# Patient Record
Sex: Male | Born: 1935 | Race: White | Hispanic: No | Marital: Married | Smoking: Former smoker
Health system: Southern US, Community
[De-identification: ages and names within clinical notes are randomized; demographics above are authoritative.]

## PROBLEM LIST (undated history)

## (undated) DIAGNOSIS — I509 Heart failure, unspecified: Secondary | ICD-10-CM

## (undated) DIAGNOSIS — I10 Essential (primary) hypertension: Secondary | ICD-10-CM

## (undated) DIAGNOSIS — J449 Chronic obstructive pulmonary disease, unspecified: Secondary | ICD-10-CM

## (undated) DIAGNOSIS — I4891 Unspecified atrial fibrillation: Secondary | ICD-10-CM

## (undated) DIAGNOSIS — I340 Nonrheumatic mitral (valve) insufficiency: Secondary | ICD-10-CM

## (undated) DIAGNOSIS — N200 Calculus of kidney: Secondary | ICD-10-CM

## (undated) DIAGNOSIS — N390 Urinary tract infection, site not specified: Secondary | ICD-10-CM

## (undated) DIAGNOSIS — I639 Cerebral infarction, unspecified: Secondary | ICD-10-CM

## (undated) DIAGNOSIS — K225 Diverticulum of esophagus, acquired: Secondary | ICD-10-CM

## (undated) DIAGNOSIS — F0390 Unspecified dementia without behavioral disturbance: Secondary | ICD-10-CM

## (undated) HISTORY — PX: LAPAROSCOPIC CHOLECYSTECTOMY: SUR755

## (undated) HISTORY — PX: KIDNEY STONE SURGERY: SHX686

## (undated) HISTORY — PX: ZENKER'S DIVERTICULECTOMY: SHX6190

## (undated) HISTORY — DX: Chronic obstructive pulmonary disease, unspecified: J44.9

---

## 1997-05-14 ENCOUNTER — Inpatient Hospital Stay (HOSPITAL_COMMUNITY): Admission: AD | Admit: 1997-05-14 | Discharge: 1997-05-15 | Payer: Self-pay | Admitting: Cardiology

## 1999-03-03 ENCOUNTER — Ambulatory Visit (HOSPITAL_COMMUNITY): Admission: RE | Admit: 1999-03-03 | Discharge: 1999-03-03 | Payer: Self-pay | Admitting: Family Medicine

## 1999-03-03 ENCOUNTER — Encounter: Payer: Self-pay | Admitting: Family Medicine

## 2000-01-21 ENCOUNTER — Inpatient Hospital Stay (HOSPITAL_COMMUNITY): Admission: EM | Admit: 2000-01-21 | Discharge: 2000-01-28 | Payer: Self-pay | Admitting: Emergency Medicine

## 2000-01-21 ENCOUNTER — Encounter (INDEPENDENT_AMBULATORY_CARE_PROVIDER_SITE_OTHER): Payer: Self-pay | Admitting: *Deleted

## 2000-01-21 ENCOUNTER — Encounter: Payer: Self-pay | Admitting: Emergency Medicine

## 2000-01-23 ENCOUNTER — Encounter (INDEPENDENT_AMBULATORY_CARE_PROVIDER_SITE_OTHER): Payer: Self-pay | Admitting: *Deleted

## 2000-01-25 ENCOUNTER — Encounter (INDEPENDENT_AMBULATORY_CARE_PROVIDER_SITE_OTHER): Payer: Self-pay | Admitting: *Deleted

## 2000-01-26 ENCOUNTER — Encounter: Payer: Self-pay | Admitting: Internal Medicine

## 2002-06-07 ENCOUNTER — Encounter: Payer: Self-pay | Admitting: Internal Medicine

## 2002-06-07 ENCOUNTER — Encounter (INDEPENDENT_AMBULATORY_CARE_PROVIDER_SITE_OTHER): Payer: Self-pay | Admitting: *Deleted

## 2002-06-07 ENCOUNTER — Ambulatory Visit (HOSPITAL_COMMUNITY): Admission: RE | Admit: 2002-06-07 | Discharge: 2002-06-07 | Payer: Self-pay | Admitting: Internal Medicine

## 2002-06-28 ENCOUNTER — Ambulatory Visit (HOSPITAL_COMMUNITY): Admission: RE | Admit: 2002-06-28 | Discharge: 2002-06-28 | Payer: Self-pay | Admitting: Internal Medicine

## 2002-06-28 ENCOUNTER — Encounter: Payer: Self-pay | Admitting: Internal Medicine

## 2003-12-13 ENCOUNTER — Ambulatory Visit: Payer: Self-pay | Admitting: Internal Medicine

## 2003-12-13 ENCOUNTER — Inpatient Hospital Stay (HOSPITAL_COMMUNITY): Admission: EM | Admit: 2003-12-13 | Discharge: 2003-12-18 | Payer: Self-pay | Admitting: Emergency Medicine

## 2003-12-14 ENCOUNTER — Encounter: Payer: Self-pay | Admitting: Cardiology

## 2003-12-20 ENCOUNTER — Ambulatory Visit: Payer: Self-pay | Admitting: Cardiology

## 2003-12-26 ENCOUNTER — Ambulatory Visit (HOSPITAL_COMMUNITY): Admission: RE | Admit: 2003-12-26 | Discharge: 2003-12-26 | Payer: Self-pay | Admitting: Cardiology

## 2003-12-26 ENCOUNTER — Ambulatory Visit: Payer: Self-pay | Admitting: Cardiology

## 2004-02-13 ENCOUNTER — Ambulatory Visit: Payer: Self-pay | Admitting: Cardiology

## 2004-02-20 ENCOUNTER — Ambulatory Visit: Payer: Self-pay | Admitting: Cardiology

## 2004-02-25 ENCOUNTER — Ambulatory Visit: Payer: Self-pay | Admitting: Cardiology

## 2004-03-12 ENCOUNTER — Ambulatory Visit: Payer: Self-pay | Admitting: Cardiology

## 2004-05-08 ENCOUNTER — Ambulatory Visit: Payer: Self-pay | Admitting: Cardiology

## 2004-08-04 ENCOUNTER — Ambulatory Visit: Payer: Self-pay | Admitting: Cardiology

## 2004-08-05 ENCOUNTER — Ambulatory Visit: Payer: Self-pay | Admitting: Cardiology

## 2005-01-21 ENCOUNTER — Ambulatory Visit: Payer: Self-pay | Admitting: Cardiology

## 2006-02-09 ENCOUNTER — Ambulatory Visit: Payer: Self-pay

## 2006-02-09 ENCOUNTER — Ambulatory Visit: Payer: Self-pay | Admitting: Cardiology

## 2006-02-09 ENCOUNTER — Encounter: Payer: Self-pay | Admitting: Cardiology

## 2006-02-22 ENCOUNTER — Ambulatory Visit: Payer: Self-pay | Admitting: Internal Medicine

## 2006-02-22 LAB — CONVERTED CEMR LAB
Calcium: 9.1 mg/dL (ref 8.4–10.5)
Chloride: 107 meq/L (ref 96–112)
Creatinine, Ser: 1 mg/dL (ref 0.4–1.5)
Eosinophils Relative: 8.2 % — ABNORMAL HIGH (ref 0.0–5.0)
Glucose, Bld: 142 mg/dL — ABNORMAL HIGH (ref 70–99)
HCT: 35.1 % — ABNORMAL LOW (ref 39.0–52.0)
INR: 0.9 (ref 0.9–2.0)
Neutrophils Relative %: 57.3 % (ref 43.0–77.0)
Prothrombin Time: 12.1 s (ref 10.0–14.0)
RBC: 3.6 M/uL — ABNORMAL LOW (ref 4.22–5.81)
RDW: 12.6 % (ref 11.5–14.6)
Sodium: 143 meq/L (ref 135–145)
WBC: 4.7 10*3/uL (ref 4.5–10.5)
aPTT: 26.4 s — ABNORMAL LOW (ref 26.5–36.5)

## 2006-03-01 ENCOUNTER — Ambulatory Visit: Payer: Self-pay | Admitting: Internal Medicine

## 2006-03-01 ENCOUNTER — Ambulatory Visit (HOSPITAL_COMMUNITY): Admission: RE | Admit: 2006-03-01 | Discharge: 2006-03-01 | Payer: Self-pay | Admitting: Internal Medicine

## 2006-03-01 HISTORY — PX: OTHER SURGICAL HISTORY: SHX169

## 2006-03-10 ENCOUNTER — Ambulatory Visit: Payer: Self-pay

## 2006-03-17 ENCOUNTER — Emergency Department (HOSPITAL_COMMUNITY): Admission: EM | Admit: 2006-03-17 | Discharge: 2006-03-17 | Payer: Self-pay | Admitting: Emergency Medicine

## 2006-03-24 ENCOUNTER — Ambulatory Visit: Payer: Self-pay | Admitting: Cardiology

## 2006-03-24 LAB — CONVERTED CEMR LAB
Calcium: 9 mg/dL (ref 8.4–10.5)
Chloride: 107 meq/L (ref 96–112)
Creatinine, Ser: 1 mg/dL (ref 0.4–1.5)
Glucose, Bld: 133 mg/dL — ABNORMAL HIGH (ref 70–99)
Pro B Natriuretic peptide (BNP): 436 pg/mL — ABNORMAL HIGH (ref 0.0–100.0)
Sodium: 144 meq/L (ref 135–145)

## 2006-04-05 ENCOUNTER — Ambulatory Visit: Payer: Self-pay | Admitting: Cardiology

## 2006-05-10 ENCOUNTER — Ambulatory Visit: Payer: Self-pay | Admitting: Cardiology

## 2006-06-01 ENCOUNTER — Ambulatory Visit: Payer: Self-pay | Admitting: Cardiology

## 2006-06-01 ENCOUNTER — Ambulatory Visit: Payer: Self-pay | Admitting: Internal Medicine

## 2006-06-01 ENCOUNTER — Inpatient Hospital Stay (HOSPITAL_COMMUNITY): Admission: EM | Admit: 2006-06-01 | Discharge: 2006-06-03 | Payer: Self-pay | Admitting: Emergency Medicine

## 2006-06-03 ENCOUNTER — Ambulatory Visit: Payer: Self-pay | Admitting: Vascular Surgery

## 2006-06-03 ENCOUNTER — Encounter: Payer: Self-pay | Admitting: Internal Medicine

## 2006-07-12 ENCOUNTER — Ambulatory Visit: Payer: Self-pay | Admitting: Cardiology

## 2006-11-24 ENCOUNTER — Ambulatory Visit: Payer: Self-pay | Admitting: Cardiology

## 2006-12-01 ENCOUNTER — Ambulatory Visit: Payer: Self-pay

## 2007-05-17 ENCOUNTER — Ambulatory Visit: Payer: Self-pay | Admitting: Internal Medicine

## 2007-06-07 ENCOUNTER — Ambulatory Visit (HOSPITAL_COMMUNITY): Admission: RE | Admit: 2007-06-07 | Discharge: 2007-06-07 | Payer: Self-pay | Admitting: Internal Medicine

## 2007-06-07 ENCOUNTER — Encounter: Payer: Self-pay | Admitting: Internal Medicine

## 2007-06-15 ENCOUNTER — Ambulatory Visit: Payer: Self-pay | Admitting: Internal Medicine

## 2007-06-17 ENCOUNTER — Ambulatory Visit: Payer: Self-pay | Admitting: Cardiology

## 2007-06-17 ENCOUNTER — Inpatient Hospital Stay (HOSPITAL_COMMUNITY): Admission: EM | Admit: 2007-06-17 | Discharge: 2007-06-21 | Payer: Self-pay | Admitting: Emergency Medicine

## 2007-06-20 ENCOUNTER — Ambulatory Visit: Payer: Self-pay | Admitting: Surgery

## 2007-06-20 ENCOUNTER — Encounter (INDEPENDENT_AMBULATORY_CARE_PROVIDER_SITE_OTHER): Payer: Self-pay | Admitting: Internal Medicine

## 2007-07-29 ENCOUNTER — Encounter (INDEPENDENT_AMBULATORY_CARE_PROVIDER_SITE_OTHER): Payer: Self-pay | Admitting: *Deleted

## 2007-12-01 ENCOUNTER — Ambulatory Visit: Payer: Self-pay | Admitting: Cardiology

## 2008-01-31 ENCOUNTER — Ambulatory Visit: Payer: Self-pay | Admitting: Cardiology

## 2008-03-21 ENCOUNTER — Encounter: Admission: RE | Admit: 2008-03-21 | Discharge: 2008-03-21 | Payer: Self-pay | Admitting: Family Medicine

## 2008-09-29 DIAGNOSIS — K219 Gastro-esophageal reflux disease without esophagitis: Secondary | ICD-10-CM | POA: Insufficient documentation

## 2008-09-29 DIAGNOSIS — I10 Essential (primary) hypertension: Secondary | ICD-10-CM | POA: Insufficient documentation

## 2008-09-29 DIAGNOSIS — R55 Syncope and collapse: Secondary | ICD-10-CM | POA: Insufficient documentation

## 2008-11-13 ENCOUNTER — Encounter: Payer: Self-pay | Admitting: Cardiology

## 2008-11-14 ENCOUNTER — Ambulatory Visit: Payer: Self-pay

## 2008-11-14 ENCOUNTER — Encounter: Payer: Self-pay | Admitting: Cardiology

## 2008-12-24 ENCOUNTER — Ambulatory Visit: Payer: Self-pay | Admitting: Internal Medicine

## 2008-12-24 DIAGNOSIS — I679 Cerebrovascular disease, unspecified: Secondary | ICD-10-CM

## 2008-12-24 DIAGNOSIS — K222 Esophageal obstruction: Secondary | ICD-10-CM

## 2009-01-21 ENCOUNTER — Telehealth: Payer: Self-pay | Admitting: Internal Medicine

## 2009-01-24 ENCOUNTER — Ambulatory Visit: Payer: Self-pay | Admitting: Internal Medicine

## 2009-01-24 ENCOUNTER — Ambulatory Visit (HOSPITAL_COMMUNITY): Admission: RE | Admit: 2009-01-24 | Discharge: 2009-01-24 | Payer: Self-pay | Admitting: Internal Medicine

## 2009-01-24 HISTORY — PX: ESOPHAGOGASTRODUODENOSCOPY: SHX1529

## 2009-01-28 ENCOUNTER — Ambulatory Visit: Payer: Self-pay | Admitting: Cardiology

## 2009-01-30 ENCOUNTER — Encounter: Payer: Self-pay | Admitting: Internal Medicine

## 2009-01-30 LAB — CONVERTED CEMR LAB
ALT: 12 units/L (ref 0–53)
AST: 17 units/L (ref 0–37)
Albumin: 3.9 g/dL (ref 3.5–5.2)
HDL: 44.8 mg/dL (ref >39.00)
Total Bilirubin: 0.7 mg/dL (ref 0.3–1.2)
Triglycerides: 70 mg/dL (ref 0.0–149.0)
VLDL: 14 mg/dL (ref 0.0–40.0)

## 2009-01-31 ENCOUNTER — Ambulatory Visit: Payer: Self-pay | Admitting: Internal Medicine

## 2009-02-21 ENCOUNTER — Emergency Department (HOSPITAL_COMMUNITY): Admission: EM | Admit: 2009-02-21 | Discharge: 2009-02-21 | Payer: Self-pay | Admitting: Emergency Medicine

## 2009-03-12 ENCOUNTER — Encounter: Admission: RE | Admit: 2009-03-12 | Discharge: 2009-04-18 | Payer: Self-pay | Admitting: Obstetrics and Gynecology

## 2009-03-15 ENCOUNTER — Telehealth (INDEPENDENT_AMBULATORY_CARE_PROVIDER_SITE_OTHER): Payer: Self-pay | Admitting: *Deleted

## 2009-07-03 ENCOUNTER — Encounter: Payer: Self-pay | Admitting: Internal Medicine

## 2009-07-16 ENCOUNTER — Encounter: Payer: Self-pay | Admitting: Cardiology

## 2009-07-18 ENCOUNTER — Telehealth: Payer: Self-pay | Admitting: Cardiology

## 2009-07-30 ENCOUNTER — Ambulatory Visit: Payer: Self-pay | Admitting: Internal Medicine

## 2009-08-01 ENCOUNTER — Ambulatory Visit (HOSPITAL_COMMUNITY): Admission: RE | Admit: 2009-08-01 | Discharge: 2009-08-01 | Payer: Self-pay | Admitting: Internal Medicine

## 2009-08-14 ENCOUNTER — Encounter: Payer: Self-pay | Admitting: Internal Medicine

## 2009-08-16 ENCOUNTER — Ambulatory Visit: Payer: Self-pay | Admitting: Cardiology

## 2009-09-15 ENCOUNTER — Ambulatory Visit: Payer: Self-pay | Admitting: Internal Medicine

## 2009-09-15 ENCOUNTER — Inpatient Hospital Stay (HOSPITAL_COMMUNITY): Admission: EM | Admit: 2009-09-15 | Discharge: 2009-09-17 | Payer: Self-pay | Admitting: Physical Therapy

## 2009-09-17 ENCOUNTER — Encounter (INDEPENDENT_AMBULATORY_CARE_PROVIDER_SITE_OTHER): Payer: Self-pay | Admitting: Neurology

## 2009-11-22 ENCOUNTER — Ambulatory Visit: Payer: Self-pay

## 2009-11-22 ENCOUNTER — Encounter: Payer: Self-pay | Admitting: Cardiology

## 2010-02-11 NOTE — Assessment & Plan Note (Signed)
Summary: DISUSS REMOVAL OF LOOP RECORDER    Visit Type:  Follow-up Primary Provider:  Donovan Kail, MD   History of Present Illness: Nathan Mills returns today for followup.  He is a pleasant elderly man with a h/o CAD, unexplained syncope and HTN.  He underwent insertion of an ILR several yrs ago and had no recurrent syncope.  Its battery is now depleted and he is referred to consider possible device removal.  He is not bothered by the device and he has been stable.  Current Medications (verified): 1)  Prilosec 20 Mg  Cpdr (Omeprazole) .... Once Daily 2)  Toprol Xl 50 Mg  Tb24 (Metoprolol Succinate) .... 1/2 By Mouth Daily 3)  Trazodone Hcl 50 Mg  Tabs (Trazodone Hcl) .... 2 Tabs Once Daily 4)  Alprazolam 0.5 Mg  Tabs (Alprazolam) .... Once Daily 5)  Plavix 75 Mg  Tabs (Clopidogrel Bisulfate) .... Take One Tablet By Mouth Once Daily. 6)  Lexapro 10 Mg  Tabs (Escitalopram Oxalate) .... Once Daily 7)  Proscar 5 Mg  Tabs (Finasteride) .... Qd 8)  Caduet 5-20 Mg  Tabs (Amlodipine-Atorvastatin) .... Once Daily  Allergies: 1)  ! * Lithium  Past History:  Past Medical History: Last updated: 09/29/2008 CAD (ICD-414.00) SYNCOPE (ICD-780.2) TIA (ICD-435.9) ZENKER'S DIVERTICULUM (ICD-530.6) HIATAL HERNIA (ICD-553.3) GERD (ICD-530.81) DEPRESSION (ICD-311) NEPHROLITHIASIS (ICD-592.0) HYPERLIPIDEMIA (ICD-272.4) HYPERTENSION (ICD-401.9)  Past Surgical History: Last updated: 09/29/2008 Laparoscopic cholecystectomy. implantation of a [employer] implantation loop recorder 03/01/2006 Doylene Canning. Ladona Ridgel, MD     Review of Systems  The patient denies chest pain, syncope, dyspnea on exertion, and peripheral edema.    Vital Signs:  Patient profile:   77 year old male Height:      68 inches Weight:      142 pounds Pulse rate:   64 / minute BP sitting:   158 / 82  (left arm)  Vitals Entered By: Laurance Flatten CMA (January 31, 2009 11:06 AM)  Physical Exam  General:  Well developed, well  nourished, in no acute distress. Head:  Normocephalic and atraumatic. Eyes:  PERRLA, no icterus. Mouth:  No deformity or lesions,  Neck:  Supple; no masses or thyromegaly. Chest Wall:  Well healed ILR Lungs:  Clear bilaterally to auscultation. No wheezes, rales, or rhonchi. Heart:  Normal S1 and S2.  No definite murmur.   Msk:  Symmetrical with no gross deformities. Normal posture. Pulses:  Normal pulses noted. Extremities:  No clubbing or cyanosis.  No edema. Neurologic:  Alert and oriented x 3.    ILR    MD Comments:  Device is past ERI.  Impression & Recommendations:  Problem # 1:  SYNCOPE (ICD-780.2) He has had no recurrent episodes since his device was placed and he has now depleted the battery.  I discussed the situation with the patient and with our [employer] representative and I have recommended leaving the device in place as it is not bothering him. His updated medication list for this problem includes:    Toprol Xl 50 Mg Tb24 (Metoprolol succinate) .Marland Kitchen... 1/2 by mouth daily    Plavix 75 Mg Tabs (Clopidogrel bisulfate) .Marland Kitchen... Take one tablet by mouth once daily.  Problem # 2:  CAD (ICD-414.00) No anginal symptoms. His updated medication list for this problem includes:    Toprol Xl 50 Mg Tb24 (Metoprolol succinate) .Marland Kitchen... 1/2 by mouth daily    Plavix 75 Mg Tabs (Clopidogrel bisulfate) .Marland Kitchen... Take one tablet by mouth once daily.

## 2010-02-11 NOTE — Assessment & Plan Note (Signed)
Summary: f1y  Medications Added TRAZODONE HCL 50 MG  TABS (TRAZODONE HCL) 2 tabs once daily PLAVIX 75 MG  TABS (CLOPIDOGREL BISULFATE) Take one tablet by mouth once daily.        Visit Type:  Follow-up Primary Katiana Ruland:  Donovan Kail, MD   History of Present Illness: [place]ed from [place].  Was in [place].  He really enjoyed his trip.  He is getting along ok, still kicking, he says, but not too high.  No chest pain.  No syncope since he had loop recorder placed along time ago.  Current Medications (verified): 1)  Prilosec 20 Mg  Cpdr (Omeprazole) .... Once Daily 2)  Toprol Xl 50 Mg  Tb24 (Metoprolol Succinate) .... 1/2 By Mouth Daily 3)  Trazodone Hcl 50 Mg  Tabs (Trazodone Hcl) .... 2 Tabs Once Daily 4)  Alprazolam 0.5 Mg  Tabs (Alprazolam) .... Once Daily 5)  Plavix 75 Mg  Tabs (Clopidogrel Bisulfate) .... Take One Tablet By Mouth Once Daily. 6)  Lexapro 10 Mg  Tabs (Escitalopram Oxalate) .... Once Daily 7)  Proscar 5 Mg  Tabs (Finasteride) .... Qd 8)  Caduet 5-20 Mg  Tabs (Amlodipine-Atorvastatin) .... Once Daily  Allergies: 1)  ! * Lithium  Past History:  Past Medical History: Last updated: 09/29/2008 CAD (ICD-414.00) SYNCOPE (ICD-780.2) TIA (ICD-435.9) ZENKER'S DIVERTICULUM (ICD-530.6) HIATAL HERNIA (ICD-553.3) GERD (ICD-530.81) DEPRESSION (ICD-311) NEPHROLITHIASIS (ICD-592.0) HYPERLIPIDEMIA (ICD-272.4) HYPERTENSION (ICD-401.9)  Patient Instructions: 1)  Your physician recommends that you schedule a follow-up appointment: first available with Dr Ladona Ridgel (discuss removal of loop recorder)  2)  Your physician wants you to follow-up in:   6 MONTHS with Dr Riley Kill. You will receive a reminder letter in the mail two months in advance. If you don't receive a letter, please call our office to schedule the follow-up appointment. 3)  Your physician recommends that you return for a FASTING LIPID and LIVER Profile today. 4)  Your physician recommends that you continue  on your current medications as directed. Please refer to the Current Medication list given to you today.   Vital Signs:  Patient profile:   77 year old male Height:      68 inches Weight:      143 pounds Pulse rate:   58 / minute BP sitting:   130 / 60  (left arm)  Vitals Entered By: Laurance Flatten CMA (January 28, 2009 11:21 AM)   Vital Signs:  Patient profile:   77 year old male Height:      68 inches Weight:      143 pounds Pulse rate:   58 / minute BP sitting:   130 / 60  (left arm)  Vitals Entered By: Laurance Flatten CMA (January 28, 2009 11:21 AM)  Physical Exam  General:  Well developed, well nourished, in no acute distress. Lungs:  Clear bilaterally to auscultation and percussion. Heart:  Normal S1 and S2.  No definite murmur.   Abdomen:  Bowel sounds positive; abdomen soft and non-tender without masses, organomegaly, or hernias noted. No hepatosplenomegaly. Extremities:  No clubbing or cyanosis.  No edema. Neurologic:  Alert and oriented x 3.   EKG  Procedure date:  01/28/2009  Findings:      NSR.  First degree aV block.  . Left axis. Inferior MI, old.    Impression & Recommendations:  Problem # 1:  CAD (ICD-414.00) stable His updated medication list for this problem includes:    Toprol Xl 50 Mg Tb24 (Metoprolol succinate) .Marland Kitchen... 1/2  by mouth daily    Plavix 75 Mg Tabs (Clopidogrel bisulfate) .Marland Kitchen... Take one tablet by mouth once daily.  Orders: EKG w/ Interpretation (93000) TLB-Lipid Panel (80061-LIPID) TLB-Hepatic/Liver Function Pnl (80076-HEPATIC)  Problem # 2:  SYNCOPE (ICD-780.2)  No recurrence.  Continue same.  Question loop recorder.  Will make appointment with Dr. Clinton Sawyer. His updated medication list for this problem includes:    Toprol Xl 50 Mg Tb24 (Metoprolol succinate) .Marland Kitchen... 1/2 by mouth daily    Plavix 75 Mg Tabs (Clopidogrel bisulfate) .Marland Kitchen... Take one tablet by mouth once daily.  Orders: TLB-Lipid Panel  (80061-LIPID) TLB-Hepatic/Liver Function Pnl (80076-HEPATIC)  Problem # 3:  HYPERLIPIDEMIA (ICD-272.4)  Recheck lipid and liver profile. His updated medication list for this problem includes:    Caduet 5-20 Mg Tabs (Amlodipine-atorvastatin) ..... Once daily  Orders: TLB-Lipid Panel (80061-LIPID) TLB-Hepatic/Liver Function Pnl (80076-HEPATIC)  Patient Instructions: 1)  Your physician recommends that you schedule a follow-up appointment: first available with Dr Ladona Ridgel (discuss removal of loop recorder)  2)  Your physician wants you to follow-up in:   6 MONTHS with Dr Riley Kill. You will receive a reminder letter in the mail two months in advance. If you don't receive a letter, please call our office to schedule the follow-up appointment. 3)  Your physician recommends that you return for a FASTING LIPID and LIVER Profile today. 4)  Your physician recommends that you continue on your current medications as directed. Please refer to the Current Medication list given to you today. Prescriptions: PLAVIX 75 MG  TABS (CLOPIDOGREL BISULFATE) Take one tablet by mouth once daily.  #90 x 3   Entered by:   Julieta Gutting, RN, BSN   Authorized by:   Ronaldo Miyamoto, MD, [facility]   Signed by:   Julieta Gutting, RN, BSN on 01/28/2009   Method used:   Electronically to        [facility]* (mail-order)             ,          Ph: [phone]       Fax: [phone]   RxID:   [id number]   Appended Document: f1y Probably should be removed.  Appended Document: f1y Noted.  Will arrange appointment with Dr. Ladona Ridgel

## 2010-02-11 NOTE — Letter (Signed)
Summary: Nathan Mills at [facility]   Imported By: Lester Tropic 07/10/2009 10:23:42  _____________________________________________________________________  External Attachment:    Type:   Image     Comment:   External Document

## 2010-02-11 NOTE — Procedures (Signed)
Summary: EGD   EGD  Procedure date:  06/28/2002  Findings:      Findings: Stricture:  [facility]   Patient Name: Nathan Mills, Nathan Mills. MRN: [id number] Procedure Procedures: Panendoscopy (EGD) CPT: 43235.    with esophageal dilation. CPT: G9296129.  Personnel: Endoscopist: Wilhemina Bonito. Marina Goodell, MD.  Referred By: Salena Saner Duane Lope, MD.  Exam Location: Exam performed in Endoscopy Suite.  Patient Consent: Procedure, Alternatives, Risks and Benefits discussed, consent obtained,  Indications Symptoms: Dysphagia.  History  Pre-Exam Physical: Performed Jun 28, 2002  Entire physical exam was normal.  Exam Exam Info: Maximum depth of insertion Duodenum, intended Duodenum. Patient position: on left side. Vocal cords visualized. Gastric retroflexion performed. Images taken. ASA Classification: III. Tolerance: excellent.  Sedation Meds: Demerol 30 mg. given IV. Versed 4 mg. given IV.  Monitoring: BP and pulse monitoring done. Oximetry used. Supplemental O2 given  Fluoroscopy: Fluoroscopy was used.  Findings HIATAL HERNIA:  STRICTURE / STENOSIS: Stricture in Distal Esophagus.  Constriction: partial. Etiology: benign due to reflux. 40 cm from mouth. Lumen diameter is 15 mm. ICD9: Esophageal Stricture: 530.3.  - Dilation: Distal Esophagus. Procedure was performed under Fluoroscopy. Wire Guided/Savary (Wilson-Cook) dilator used, Diameter: 18 mm, No Resistance, No Heme present on extraction. 1  total dilators used. Patient tolerance excellent.   Assessment Abnormal examination, see findings above.  Diagnoses: 530.3: Esophageal Stricture.  530.81: GERD.   Events  Unplanned Intervention: No unplanned interventions were required.  Unplanned Events: There were no complications. Plans Instructions: Nothing to eat or drink for 1 hr.  Clear or full liquids: 2 hrs. Resume previous diet: am. Restart medications: today,including Plavix.  Medication(s): Continue current  medications.  Disposition: After procedure patient sent to recovery. After recovery patient sent home.  Scheduling: Follow-up prn.   This report was created from the original endoscopy report, which was reviewed and signed by the above listed endoscopist.   cc:  C.A. Freda Jackson, MD      The Patient

## 2010-02-11 NOTE — Miscellaneous (Signed)
Summary: Orders Update  Clinical Lists Changes  Problems: Added new problem of CAROTID ARTERY DISEASE (ICD-433.10) Orders: Added new Test order of Carotid Duplex (Carotid Duplex) - Signed 

## 2010-02-11 NOTE — Procedures (Signed)
Summary: Upper Endoscopy  Patient: Nathan Mills Note: All result statuses are Final unless otherwise noted.  Tests: (1) Upper Endoscopy (EGD)   EGD Upper Endoscopy       DONE     [facility]     [address] [place], [place]  [address]           ENDOSCOPY PROCEDURE REPORT           PATIENT:  [name], [name]  MR#:  [id number]     BIRTHDATE:  [birth year], 89 yrs. old  GENDER:  male           ENDOSCOPIST:  Wilhemina Bonito. Eda Keys, MD     Referred by:  Diamond Nickel, M.D.           PROCEDURE DATE:  01/24/2009     PROCEDURE:  EGD with balloon dilatation - 15-16.5-18mm     ASA CLASS:  Class III     INDICATIONS:  dysphagia, dilation of esophageal stricture           MEDICATIONS:   Fentanyl 25 mcg IV, Versed 2 mg IV     TOPICAL ANESTHETIC:  Cetacaine Spray           DESCRIPTION OF PROCEDURE:   After the risks benefits and     alternatives of the procedure were thoroughly explained, informed     consent was obtained.  The [id number] ([id number]) endoscope was     introduced through the mouth and advanced to the second portion of     the duodenum, without limitations.  The instrument was slowly     withdrawn as the mucosa was fully examined.     <<PROCEDUREIMAGES>>           Zenker's diverticulum. A 14mm  stricture was found in the distal     esophagus.  Pedunculated polyps were found in the fundus/ body.  A     4cm hiatal hernia was found.    Retroflexed views revealed H/H.           THERAPY: 15-16.5-18MM BALLOON DILATION W/ MILD RESISTANCE AND NO     HEME. TOLERTED WELL           The scope was then withdrawn from the patient and the procedure     completed.           COMPLICATIONS:  None           ENDOSCOPIC IMPRESSION:     1) Stricture in the distal esophagus - DILATED     2) Pedunculated polyps in the fundus/body     3) Hiatal hernia     4) ZENKER'S DIVERTICULUM     RECOMMENDATIONS:     1) follow-up FOR FURTHER PROBLEMS. IF NO IMPROVEMENT, SUSPECT     THAT SWALLOWIN  IS ALSO COMPROMISED BY ZENKERS AND PRIOR STROKE           ______________________________     Wilhemina Bonito. Eda Keys, MD           CC:  The Patient, DR [facility]           n.     eSIGNED:   Wilhemina Bonito. Eda Keys at 01/24/2009 03:08 PM           Tori Milks, [id number]  Note: An exclamation mark (!) indicates a result that was not dispersed into the flowsheet. Document Creation Date: 01/24/2009 3:09 PM _______________________________________________________________________  (1) Order result status: Final Collection or observation  date-time: 01/24/2009 14:57 Requested date-time:  Receipt date-time:  Reported date-time:  Referring Physician:   Ordering Physician: Fransico Setters [phone]) Specimen Source:  Source: Launa Grill Order Number: [id number] Lab site:

## 2010-02-11 NOTE — Letter (Signed)
Summary: [facility]  [facility]   Imported By: Sherian Rein 08/20/2009 08:14:46  _____________________________________________________________________  External Attachment:    Type:   Image     Comment:   External Document

## 2010-02-11 NOTE — Assessment & Plan Note (Signed)
Summary: 6 month/dmiller      Allergies Added:   Visit Type:  Follow-up Referring Provider:  Beverley Fiedler, MD Primary Provider:  Donovan Kail, MD  CC:  none.  History of Present Illness: He is not driving, but wife is doing this.  No chest pain.  [place]    He is losing a bit of weight, and is going to see Dr. Tenny Craw about the weight.  No passing out, or near syncope.   Has moderate shortness of breath with activity.  Overall he is doing well.  ECG discussed.  He is not interested in other workup given lack of symptomatic status.   Has a Zenker's diverticulum and stricture, and they have discussed possibility of doing surgery on that.  He is thinking about it.     Current Medications (verified): 1)  Prilosec 20 Mg  Cpdr (Omeprazole) .... Once Daily 2)  Toprol Xl 50 Mg  Tb24 (Metoprolol Succinate) .... 1/2 By Mouth Daily 3)  Trazodone Hcl 50 Mg  Tabs (Trazodone Hcl) .... 2 Tabs Once Daily 4)  Alprazolam 0.5 Mg  Tabs (Alprazolam) .... Once Daily 5)  Plavix 75 Mg  Tabs (Clopidogrel Bisulfate) .... Take One Tablet By Mouth Once Daily. 6)  Lexapro 10 Mg  Tabs (Escitalopram Oxalate) .... Once Daily 7)  Proscar 5 Mg  Tabs (Finasteride) .... Qd 8)  Caduet 5-20 Mg  Tabs (Amlodipine-Atorvastatin) .... Once Daily  Allergies (verified): 1)  ! * Lithium  Past History:  Past Medical History: Last updated: 09/29/2008 CAD (ICD-414.00) SYNCOPE (ICD-780.2) TIA (ICD-435.9) ZENKER'S DIVERTICULUM (ICD-530.6) HIATAL HERNIA (ICD-553.3) GERD (ICD-530.81) DEPRESSION (ICD-311) NEPHROLITHIASIS (ICD-592.0) HYPERLIPIDEMIA (ICD-272.4) HYPERTENSION (ICD-401.9)  Vital Signs:  Patient profile:   77 year old male Pulse rate:   58 / minute BP sitting:   130 / 56  (right arm)  Vitals Entered By: Burnett Kanaris, CNA (August 16, 2009 11:55 AM)  Physical Exam  General:  Well developed, well nourished, in no acute distress. Head:  normocephalic and atraumatic Eyes:  PERRLA/EOM  intact; conjunctiva and lids normal. Lungs:  Clear bilaterally to auscultation and percussion. Heart:  PMI non displaced.  Normal S1 and S2.  No definite murmur.  Abdomen:  Bowel sounds positive; abdomen soft and non-tender without masses, organomegaly, or hernias noted. No hepatosplenomegaly. Extremities:  No clubbing or cyanosis. Neurologic:  Alert and oriented x 3.   EKG  Procedure date:  08/16/2009  Findings:      Sinus bradycardia with first degree av block.  LAD.  Inferior MI, old. Delay in R wave progression.  T wave inversion in V5 and V6 more prominent than prior tracings.   Impression & Recommendations:  Problem # 1:  CAD (ICD-414.00) Virtually no symptoms on medical therapy.  See results of ECG.  Discussed with patient, and based on age, and lack of symptoms, will continue a medical approach to treatment.  He feels really quite well.   His updated medication list for this problem includes:    Toprol Xl 50 Mg Tb24 (Metoprolol succinate) .Marland Kitchen... 1/2 by mouth daily    Plavix 75 Mg Tabs (Clopidogrel bisulfate) .Marland Kitchen... Take one tablet by mouth once daily.  Problem # 2:  ESOPHAGEAL STRICTURE (ICD-530.3) Has Zenker's and may need some surgery, but will need to see again if that is discussed.  He thinks he is going to wait, and see how things go.   Problem # 3:  HYPERLIPIDEMIA (ICD-272.4) continue medical therapy.  His updated medication  list for this problem includes:    Caduet 5-20 Mg Tabs (Amlodipine-atorvastatin) ..... Once daily  Problem # 4:  HYPERTENSION (ICD-401.9) well controlled.  His updated medication list for this problem includes:    Toprol Xl 50 Mg Tb24 (Metoprolol succinate) .Marland Kitchen... 1/2 by mouth daily    Caduet 5-20 Mg Tabs (Amlodipine-atorvastatin) ..... Once daily  Patient Instructions: 1)  Your physician recommends that you schedule a follow-up appointment in: 6 months

## 2010-02-11 NOTE — Miscellaneous (Signed)
  Clinical Lists Changes  Observations: Added new observation of US CAROTID: Stable mild to moderate carotid artery disease, bilaterally 40-59% bilateral ICA stenosis . f/u 1 year (11/14/2008 11:30)      Carotid Doppler  Procedure date:  11/14/2008  Findings:      Stable mild to moderate carotid artery disease, bilaterally 40-59% bilateral ICA stenosis . f/u 1 year

## 2010-02-11 NOTE — Progress Notes (Signed)
Summary: hosp procedure    Phone Note Call from Patient Call back at Home Phone [phone]   Caller: wife Call For: Dr. Marina Goodell Reason for Call: Talk to Nurse Summary of Call: want to cancel and resch hospital procedure due to possible bad weather Initial call taken by: Vallarie Mare,  January 21, 2009 8:42 AM  Follow-up for Phone Call        Procedure changed from 01/22/2009 at [facility] to Thursday at 1:30 p.  per ok from [place]  in endo.Pt.informed and re-instructed.  Follow-up by: Teryl Lucy RN,  January 21, 2009 9:41 AM

## 2010-02-11 NOTE — Assessment & Plan Note (Signed)
Summary: DYSPHAGIA    History of Present Illness Visit Type: Follow-up Consult Primary GI MD: Yancey Flemings MD Primary Provider: Donovan Kail, MD Requesting Provider: Beverley Fiedler, MD Chief Complaint: Increasing solid food dysphagia with certain meats and breads. Pt denies N/V or acid reflux sx. History of Present Illness:   77-year-old white male with multiple significant medical problems including coronary artery disease, hypertension, cerebrovascular disease with recurrent stroke, trigeminal neuralgia, dyslipidemia, COPD, peptic stricture of the esophagus requiring esophageal dilation, and Zenker's diverticulum. He was last evaluated and this office December 2010 for dysphagia. See that dictation. Based on the history, the etiology was uncertain. He subsequently underwent upper endoscopy on January 24, 2009. He was found to have a Zenker's diverticulum, 14 mm stricture of the distal esophagus, hiatal hernia, an incidental gastric polyps. He was dilated with a sequential balloon dilator to a maximal diameter of 18 mm. This was performed on aspirin and Plavix. He tells me that the procedure may have helped for a very short period of time. He is continued with intermittent dysphagia to mostly solids and pills. He points to the cervical esophagus. No other issues. Multiple significant medical problems are reported to be stable.   GI Review of Systems    Reports dysphagia with solids.      Denies abdominal pain, acid reflux, belching, bloating, chest pain, dysphagia with liquids, heartburn, loss of appetite, nausea, vomiting, vomiting blood, weight loss, and  weight gain.        Denies anal fissure, black tarry stools, change in bowel habit, constipation, diarrhea, diverticulosis, fecal incontinence, heme positive stool, hemorrhoids, irritable bowel syndrome, jaundice, light color stool, liver problems, rectal bleeding, and  rectal pain.    Current Medications (verified): 1)  Prilosec 20 Mg  Cpdr  (Omeprazole) .... Once Daily 2)  Toprol Xl 50 Mg  Tb24 (Metoprolol Succinate) .... 1/2 By Mouth Daily 3)  Trazodone Hcl 50 Mg  Tabs (Trazodone Hcl) .... 2 Tabs Once Daily 4)  Alprazolam 0.5 Mg  Tabs (Alprazolam) .... Once Daily 5)  Plavix 75 Mg  Tabs (Clopidogrel Bisulfate) .... Take One Tablet By Mouth Once Daily. 6)  Lexapro 10 Mg  Tabs (Escitalopram Oxalate) .... Once Daily 7)  Proscar 5 Mg  Tabs (Finasteride) .... Qd 8)  Caduet 5-20 Mg  Tabs (Amlodipine-Atorvastatin) .... Once Daily  Allergies (verified): 1)  ! * Lithium  Past History:  Past Medical History: Reviewed history from 09/29/2008 and no changes required. CAD (ICD-414.00) SYNCOPE (ICD-780.2) TIA (ICD-435.9) ZENKER'S DIVERTICULUM (ICD-530.6) HIATAL HERNIA (ICD-553.3) GERD (ICD-530.81) DEPRESSION (ICD-311) NEPHROLITHIASIS (ICD-592.0) HYPERLIPIDEMIA (ICD-272.4) HYPERTENSION (ICD-401.9)  Past Surgical History: Reviewed history from 09/29/2008 and no changes required. Laparoscopic cholecystectomy. implantation of a [employer] implantation loop recorder 03/01/2006 Doylene Canning. Ladona Ridgel, MD     Family History: Reviewed history from 09/29/2008 and no changes required. Coronary artery disease positive.  Social History: Reviewed history from 09/29/2008 and no changes required.  The patient is married.  Lives in [place] with his   wife.  He is retired from the [employer].  Prior tobacco history,   quit about 40 years ago.  No alcohol use.  No IV drug use.  The patient  has two children; two daughters, one with a history of eye cancer.      Review of Systems  The patient denies allergy/sinus, anemia, anxiety-new, arthritis/joint pain, back pain, blood in urine, breast changes/lumps, change in vision, confusion, cough, coughing up blood, depression-new, fainting, fatigue, fever, headaches-new, hearing problems, heart murmur, heart rhythm  changes, itching, menstrual pain, muscle pains/cramps, night sweats, nosebleeds,  pregnancy symptoms, shortness of breath, skin rash, sleeping problems, sore throat, swelling of feet/legs, swollen lymph glands, thirst - excessive , urination - excessive , urination changes/pain, urine leakage, vision changes, and voice change.    Vital Signs:  Patient profile:   77 year old male Height:      68 inches Weight:      135.38 pounds BMI:     20.66 Pulse rate:   68 / minute Pulse rhythm:   regular BP sitting:   124 / 58  (left arm) Cuff size:   regular  Vitals Entered By: Christie Nottingham CMA Duncan Dull) (July 30, 2009 9:52 AM)  Physical Exam  General:  Well developed, well nourished, no acute distress. Head:  Normocephalic and atraumatic. Eyes:  PERRLA, no icterus. Mouth:  No deformity or lesionsl. Neck:  Supple; no masses or thyromegaly. Lungs:  Clear throughout to auscultation. Heart:  Regular rate and rhythm; no murmurs, rubs,  or bruits. Abdomen:  Soft, nontender and nondistended. No masses, hepatosplenomegaly or hernias noted. Normal bowel sounds. Msk:  mild kyphosis Pulses:  normal pulses Extremities:  no edema Neurologic:  alert and oriented Skin:  no jaundice Psych:  Alert and cooperative. Normal mood and affect.   Impression & Recommendations:  Problem # 1:  DYSPHAGIA (ZOX-096.04) the patient continues with intermittent solid food dysphagia. I am concerned that the lack of response to large caliber dilation may signify that his dysphagia is related to cricopharyngeal hypertension and the Zenker's diverticulum. Less likely problems secondary to prior stroke as he denies liquid dysphagia or symptoms of aspiration. At this point, he remains at HIGH RISK for endoscopic intervention. As such, with the questions as outlined, we will proceed next with a barium esophagogram with tablet. We might be able to see if the principal problem is related to the cricopharyngeal region or the known distal esophageal stricture. Thereafter, we can discuss treatment  options.  Problem # 2:  ESOPHAGEAL STRICTURE (ICD-530.3) known distal esophageal stricture. Minimal benefit to prior dilation with large caliber balloon in January 2011. Will proceed with plan as outlined above  Problem # 3:  CEREBROVASCULAR DISEASE (ICD-437.9) significant. Prior CNS event shortly after manipulating Plavix. Implications for future therapeutics as previously discussed in detail  Problem # 4:  CAD (ICD-414.00) Assessment: Comment Only  Other Orders: Barium Swallow with Tablet (BS w/tab)  Patient Instructions: 1)  Barium Swallow with tablet scheduled at [facility] 08/01/09 9:00 am arrive at 8:45 am  2)  Copy sent to : Beverley Fiedler, MD 3)  The medication list was reviewed and reconciled.  All changed / newly prescribed medications were explained.  A complete medication list was provided to the patient / caregiver.

## 2010-02-11 NOTE — Progress Notes (Signed)
Summary: dental work/plavix   Phone Note Call from Patient Call back at Home Phone [phone]   Caller: Patient Reason for Call: Talk to Nurse Summary of Call: having dental work, needs clearance to come off of plavix for 5 days Initial call taken by: Migdalia Dk,  July 18, 2009 9:33 AM  Follow-up for Phone Call        I spoke with the pt and made him aware that Dr Riley Kill does not feel it is ideal to stop plavix with history of strokes.  I did fax note to  [facility].  Follow-up by: Julieta Gutting, RN, BSN,  July 18, 2009 9:55 AM

## 2010-02-11 NOTE — Letter (Signed)
Summary: [facility]   Imported By: Marylou Mccoy 08/06/2009 13:37:57  _____________________________________________________________________  External Attachment:    Type:   Image     Comment:   External Document

## 2010-02-11 NOTE — Progress Notes (Signed)
Summary: refill meds   Phone Note Refill Request Call back at Home Phone [phone] Message from:  Patient on March 15, 2009 9:43 AM  Refills Requested: Medication #1:  CADUET 5-20 MG  TABS once daily. [facility] mail order    Method Requested: Fax to [facility] Initial call taken by: Lorne Skeens,  March 15, 2009 9:44 AM  Follow-up for Phone Call        Rx faxed to pharmacy Follow-up by: Vikki Ports,  March 15, 2009 12:49 PM    Prescriptions: CADUET 5-20 MG  TABS (AMLODIPINE-ATORVASTATIN) once daily  #90 x 3   Entered by:   Vikki Ports   Authorized by:   Ronaldo Miyamoto, MD, [facility]   Signed by:   Vikki Ports on 03/15/2009   Method used:   Faxed to ...       [facility] (mail-order)             ,          Ph: [phone]       Fax: [phone]   RxID:   [id number]

## 2010-02-25 ENCOUNTER — Ambulatory Visit (INDEPENDENT_AMBULATORY_CARE_PROVIDER_SITE_OTHER): Payer: Medicare Other | Admitting: Cardiology

## 2010-02-25 ENCOUNTER — Encounter: Payer: Self-pay | Admitting: Cardiology

## 2010-02-25 DIAGNOSIS — E785 Hyperlipidemia, unspecified: Secondary | ICD-10-CM

## 2010-02-25 DIAGNOSIS — I6529 Occlusion and stenosis of unspecified carotid artery: Secondary | ICD-10-CM

## 2010-02-25 DIAGNOSIS — I2510 Atherosclerotic heart disease of native coronary artery without angina pectoris: Secondary | ICD-10-CM

## 2010-03-05 NOTE — Assessment & Plan Note (Signed)
Summary: f/u 6 months/lwb    Visit Type:  6 months follow up Referring Provider:  Beverley Fiedler, MD Primary Provider:  Donovan Kail, MD  CC:  No complaints.  History of Present Illness: Overall doing well.  Denies chest pain.  Continues to do well. Still losing weight.  He says he eats well, but his wife says he barely eats.  Did not know he had rash on belly button.  No stroke symptoms.   Problems Prior to Update: 1)  Skin Rash  (ICD-782.1) 2)  Carotid Artery Disease  (ICD-433.10) 3)  Cerebrovascular Disease  (ICD-437.9) 4)  Esophageal Stricture  (ICD-530.3) 5)  Dysphagia  (ZOX-096.04) 6)  Carotid Artert Disease (433.10)  () 7)  Cad  (ICD-414.00) 8)  Syncope  (ICD-780.2) 9)  Tia  (ICD-435.9) 10)  Zenker's Diverticulum  (ICD-530.6) 11)  Hiatal Hernia  (ICD-553.3) 12)  Gerd  (ICD-530.81) 13)  Depression  (ICD-311) 14)  Nephrolithiasis  (ICD-592.0) 15)  Hyperlipidemia  (ICD-272.4) 16)  Hypertension  (ICD-401.9)  Current Medications (verified): 1)  Prilosec 20 Mg  Cpdr (Omeprazole) .... Once Daily 2)  Toprol Xl 50 Mg  Tb24 (Metoprolol Succinate) .... 1/2 By Mouth Daily 3)  Trazodone Hcl 50 Mg  Tabs (Trazodone Hcl) .... 2 Tabs Once Daily 4)  Alprazolam 0.5 Mg  Tabs (Alprazolam) .... Once Daily 5)  Plavix 75 Mg  Tabs (Clopidogrel Bisulfate) .... Take One Tablet By Mouth Once Daily. 6)  Lexapro 10 Mg  Tabs (Escitalopram Oxalate) .... Once Daily 7)  Proscar 5 Mg  Tabs (Finasteride) .... Qd 8)  Caduet 5-20 Mg  Tabs (Amlodipine-Atorvastatin) .... Once Daily  Allergies: 1)  ! * Lithium  Vital Signs:  Patient profile:   77 year old male Height:      68 inches Weight:      131 pounds BMI:     19.99 Pulse rate:   59 / minute Pulse rhythm:   irregular Resp:     18 per minute BP sitting:   150 / 74  (left arm) Cuff size:   large  Vitals Entered By: Vikki Ports (February 25, 2010 11:32 AM)  Physical Exam  General:  Well developed, well nourished, in no acute  distress. Head:  normocephalic and atraumatic Eyes:  PERRLA/EOM intact; conjunctiva and lids normal. Lungs:  Clear bilaterally to auscultation and percussion. Heart:  PMI non displaced. Normal S1 and S2.  Minimal SEM.   Abdomen:  Bowel sounds positive; abdomen soft and non-tender without masses, organomegaly, or hernias noted. No hepatosplenomegaly.  Has circular rash over belly button.   Pulses:  pulses normal in all 4 extremities Extremities:  No clubbing or cyanosis. Neurologic:  Alert and oriented x 3.   EKG  Procedure date:  02/25/2010  Findings:      Doing well.  No major symptoms.  Denies chest pain or stroke related symptoms.  Not driving.  Decided not to have surgery for Zenker's diverticulum.  Impression & Recommendations:  Problem # 1:  CAD (ICD-414.00) no current symptoms.  Doing well overall.  Will see back in one year.  His updated medication list for this problem includes:    Toprol Xl 50 Mg Tb24 (Metoprolol succinate) .Marland Kitchen... 1/2 by mouth daily    Plavix 75 Mg Tabs (Clopidogrel bisulfate) .Marland Kitchen... Take one tablet by mouth once daily.  Orders: EKG w/ Interpretation (93000)  Problem # 2:  CAROTID ARTERY DISEASE (ICD-433.10) see last doppler.  Followup as suggested on document. His updated medication list for  this problem includes:    Plavix 75 Mg Tabs (Clopidogrel bisulfate) .Marland Kitchen... Take one tablet by mouth once daily.  Problem # 3:  HYPERLIPIDEMIA (ICD-272.4) sees Dr. Tenny Craw in April and will defer labs until he sees him. His updated medication list for this problem includes:    Caduet 5-20 Mg Tabs (Amlodipine-atorvastatin) ..... Once daily  Problem # 4:  SKIN RASH (ICD-782.1) Has circular rash over bully button.  Picture shown to patient.  Could be fungal.  Options reviewed but suggested patient see Dr. Tenny Craw or dermatologist that he normally sees.    Patient Instructions: 1)  Your physician recommends that you schedule a follow-up appointment in: 1 year with Dr.  Riley Kill 2)  Your physician recommends that you continue on your current medications as directed. Please refer to the Current Medication list given to you today.

## 2010-03-27 LAB — URINALYSIS, ROUTINE W REFLEX MICROSCOPIC
Bilirubin Urine: NEGATIVE
Glucose, UA: NEGATIVE mg/dL
Hgb urine dipstick: NEGATIVE
Ketones, ur: NEGATIVE mg/dL
Nitrite: NEGATIVE
Protein, ur: NEGATIVE mg/dL
Specific Gravity, Urine: 1.008 (ref 1.005–1.030)
Urobilinogen, UA: 0.2 mg/dL (ref 0.0–1.0)
pH: 7 (ref 5.0–8.0)

## 2010-03-27 LAB — CBC
HCT: 38.1 % — ABNORMAL LOW (ref 39.0–52.0)
Hemoglobin: 12.6 g/dL — ABNORMAL LOW (ref 13.0–17.0)
MCH: 31.9 pg (ref 26.0–34.0)
MCHC: 33.1 g/dL (ref 30.0–36.0)
MCV: 96.5 fL (ref 78.0–100.0)
Platelets: 195 10*3/uL (ref 150–400)
RBC: 3.95 MIL/uL — ABNORMAL LOW (ref 4.22–5.81)
RDW: 12.7 % (ref 11.5–15.5)
WBC: 5.2 10*3/uL (ref 4.0–10.5)

## 2010-03-27 LAB — URINE MICROSCOPIC-ADD ON

## 2010-03-27 LAB — CK TOTAL AND CKMB (NOT AT ARMC)
CK, MB: 1.9 ng/mL (ref 0.3–4.0)
Relative Index: INVALID (ref 0.0–2.5)
Total CK: 64 U/L (ref 7–232)

## 2010-03-27 LAB — HEMOGLOBIN A1C: Hgb A1c MFr Bld: 6 % — ABNORMAL HIGH (ref <5.7)

## 2010-03-27 LAB — URINE CULTURE: Culture  Setup Time: 201109042207

## 2010-03-27 LAB — PROTIME-INR
INR: 1 (ref 0.00–1.49)
Prothrombin Time: 13.4 seconds (ref 11.6–15.2)

## 2010-03-27 LAB — DIFFERENTIAL
Basophils Relative: 0 % (ref 0–1)
Eosinophils Absolute: 0.1 10*3/uL (ref 0.0–0.7)
Monocytes Relative: 8 % (ref 3–12)
Neutrophils Relative %: 59 % (ref 43–77)

## 2010-03-27 LAB — LIPID PANEL
HDL: 38 mg/dL — ABNORMAL LOW (ref >39)
Total CHOL/HDL Ratio: 3.6 RATIO
Triglycerides: 86 mg/dL (ref <150)
VLDL: 17 mg/dL (ref 0–40)

## 2010-03-27 LAB — COMPREHENSIVE METABOLIC PANEL
ALT: 12 U/L (ref 0–53)
Alkaline Phosphatase: 130 U/L — ABNORMAL HIGH (ref 39–117)
Chloride: 107 mEq/L (ref 96–112)
GFR calc Af Amer: >60 mL/min (ref >60)
GFR calc non Af Amer: >60 mL/min (ref >60)

## 2010-03-27 LAB — APTT: aPTT: 24 seconds (ref 24–37)

## 2010-03-27 LAB — TROPONIN I: Troponin I: 0.03 ng/mL (ref 0.00–0.06)

## 2010-04-02 ENCOUNTER — Other Ambulatory Visit: Payer: Self-pay | Admitting: *Deleted

## 2010-04-02 DIAGNOSIS — E785 Hyperlipidemia, unspecified: Secondary | ICD-10-CM

## 2010-04-02 LAB — DIFFERENTIAL
Basophils Absolute: 0 10*3/uL (ref 0.0–0.1)
Basophils Relative: 0 % (ref 0–1)
Monocytes Absolute: 0.3 10*3/uL (ref 0.1–1.0)
Neutro Abs: 3.1 10*3/uL (ref 1.7–7.7)
Neutrophils Relative %: 57 % (ref 43–77)

## 2010-04-02 LAB — POCT I-STAT, CHEM 8
Creatinine, Ser: 1.1 mg/dL (ref 0.4–1.5)
Hemoglobin: 11.9 g/dL — ABNORMAL LOW (ref 13.0–17.0)
Sodium: 143 mEq/L (ref 135–145)
TCO2: 25 mmol/L (ref 0–100)

## 2010-04-02 LAB — CBC
MCHC: 34.9 g/dL (ref 30.0–36.0)
Platelets: 159 10*3/uL (ref 150–400)
RDW: 12.9 % (ref 11.5–15.5)

## 2010-04-02 LAB — PROTIME-INR
INR: 1.01 (ref 0.00–1.49)
Prothrombin Time: 13.2 seconds (ref 11.6–15.2)

## 2010-04-02 MED ORDER — AMLODIPINE-ATORVASTATIN 5-20 MG PO TABS: 1.0000 | ORAL_TABLET | Freq: Every day | ORAL | Status: DC

## 2010-04-02 NOTE — Telephone Encounter (Signed)
Spoke to patient to verify pharmacy.  Pt would like to have Rx sent for generic Caduet to [facility] [address] 90 day supply.   Judithe Modest, CMA

## 2010-05-02 ENCOUNTER — Telehealth: Payer: Self-pay | Admitting: Cardiology

## 2010-05-02 DIAGNOSIS — E785 Hyperlipidemia, unspecified: Secondary | ICD-10-CM

## 2010-05-02 MED ORDER — AMLODIPINE-ATORVASTATIN 5-20 MG PO TABS: 1.0000 | ORAL_TABLET | Freq: Every day | ORAL | Status: DC

## 2010-05-02 NOTE — Telephone Encounter (Signed)
I spoke with the pt and he needs a 90 day Rx faxed to [facility] at [phone] for Caduet 5/20mg .  The pt picked up a local prescription for 30 days and this cost more than a 90 day supply.  I will print a new Rx and have Dr Riley Kill sign Rx prior to fax.

## 2010-05-02 NOTE — Telephone Encounter (Signed)
Pt wants to change his meds. Pt wants to talk lauren re his meds

## 2010-05-12 ENCOUNTER — Telehealth: Payer: Self-pay | Admitting: Cardiology

## 2010-05-12 DIAGNOSIS — E785 Hyperlipidemia, unspecified: Secondary | ICD-10-CM

## 2010-05-12 NOTE — Telephone Encounter (Addendum)
Pt gave [place] wrong fax number, rx sent to wrong fax - should be [phone] for caduet 90 day supply w/3 refills  pt [phone] pls call when sent in

## 2010-05-13 ENCOUNTER — Telehealth: Payer: Self-pay | Admitting: Cardiology

## 2010-05-13 ENCOUNTER — Other Ambulatory Visit: Payer: Self-pay | Admitting: *Deleted

## 2010-05-13 MED ORDER — AMLODIPINE-ATORVASTATIN 5-20 MG PO TABS: 1.0000 | ORAL_TABLET | Freq: Every day | ORAL | Status: DC

## 2010-05-13 NOTE — Telephone Encounter (Signed)
Pt needs refill on caduet sent to [facility] per pt he gave all the information last week and he wants to know was it done

## 2010-05-14 ENCOUNTER — Other Ambulatory Visit: Payer: Self-pay | Admitting: *Deleted

## 2010-05-14 MED ORDER — AMLODIPINE-ATORVASTATIN 5-20 MG PO TABS: 1.0000 | ORAL_TABLET | Freq: Every day | ORAL | Status: AC

## 2010-05-14 NOTE — Telephone Encounter (Signed)
Pt calling back said he hasn't heard from [place] re faxing his med to [facility] from his request Monday

## 2010-05-14 NOTE — Telephone Encounter (Signed)
Pt received call this morning from a male stating that his medications had been sent into [facility] where he originally wanted it sent.  Pt says that he should no longer have any medications filled at [facility].  Pt would like all medications sent to  [facility].   Judithe Modest, CMA, AAMA

## 2010-05-27 NOTE — Assessment & Plan Note (Signed)
[facility]                            CARDIOLOGY OFFICE NOTE   NAME:Nathan Mills, Nathan Mills                     MRN:          [id number]  DATE:07/12/2006                            DOB:          [birth year]    [name] is in for followup. He was admitted to the hospital by Dr.  Ladona Ridgel. He was thought to have a small stroke. We do not have a  discharge summary from that admission. He was admitted and he was seen  in consultation by the neurologist. He apparently had bilateral carotid  disease and was treated medically and he is feeling really a lot better.  He underwent a CT of the head  which suggested small vessel disease. He  was given diuretics. He was seen in consultation by the neurologists.  They did not place him on heparin. He is doing a lot better at this  point in time.   MEDICATIONS:  Include;  1. Prilosec 20 mg daily.  2. Toprol 50 mg one half tablet daily.  3. Trazodone 50 mg daily.  4. Xanax 0.5 mg p.m. daily.  5. Plavix 75 mg daily.  6. Lexapro 10 mg daily.  7. Proscar 5 mg daily.  8. Caduet 5/20 mg daily.  9. Aspirin 81 mg a day.   PHYSICAL EXAMINATION:  The blood pressure is 140/70, the pulse is 54.  The patient is alert and oriented.  His neurologic exam is symmetric. He has full grips.  LUNG FIELDS: Clear.  CARDIAC  EXAM: Basically unchanged.  EXTREMITIES: Do not reveal significant edema.   EKG: Reveals sinus bradycardia with first degree AV block and inferior  infarct of indeterminate age.   Overall the patient has remained stable. He was discharged from the  hospital. He has been tolerating his medicines well and [place]. He wants to not return for 4 months if  that is possible. I will see him back in follow up in 4 months and at  that time we will need to get Doppler studies.     Nathan Mills. Riley Kill, MD, [facility]  Electronically Signed    TDS/MedQ  DD: 07/12/2006  DT: 07/12/2006  Job  #: [id number]

## 2010-05-27 NOTE — Consult Note (Signed)
NAME:  Nathan Mills, Nathan Mills NO.:  [id number]   MEDICAL RECORD NO.:  [id number]          PATIENT TYPE:  INP   LOCATION:  3736                         FACILITY:  [facility]   PHYSICIAN:  Michael L. Reynolds, M.D.DATE OF BIRTH:  01-19-1917   DATE OF CONSULTATION:  06/17/2007  DATE OF DISCHARGE:                                 CONSULTATION   REQUESTING PHYSICIAN:  Dr. Janee Morn.   REASON FOR EVALUATION:  Stroke and confusion.   HISTORY OF PRESENT ILLNESS:  This is an inpatient consultation  evaluation of this existing [facility] patient, an  77 year old man with a past medical history which includes a transient  ischemic attack in May 2008, history of known cerebrovascular disease,  trigeminal neuralgia involving the right side of the face, and several  other medical problems.  The patient was admitted to the hospital early  yesterday morning.  He says he felt okay when he went to bed the night  before last, but during that night, got up and felt generally weak to  the point he was having difficulty getting around.  He woke up that  morning and felt weaker still, went into the bathroom, and basically was  not able to get out of the bathroom.  He denied focal symptoms at that  time.  His wife reported that he had some slurred speech.  EMS was  alerted, and he was brought to the emergency department.  He was found  at that time to be febrile and had a urinary tract infection.  He was  admitted to the hospital and then was treated with intravenous  antibiotics.  His symptoms have been rapidly clearing, and today, he  feels back to his baseline.  He had an MRI of the brain yesterday which  revealed an abnormality, and neurologic consultation was requested.  He  states that he has had a little more trouble with his trigeminal  neuralgia recently and was started back again gabapentin 100 mg t.i.d.  He started this 3 days prior to admission.  He says he has never  taken  this drug before.  His wife says that if this drug fails, his doctors  plan on doing a surgery later this month.  Neurologic examination was  requested as above.  He was also noted in the emergency department to  have a blood glucose on a BMET of greater than 500, although blood  glucose done on i-STAT only moments earlier was in the 120 range.  He  was briefly on an insulin drip, but since then he has been maintaining  his blood glucose in the 100-150 range.   PAST MEDICAL HISTORY:  Remarkable for TIA in May 2008.  He had a workup  at that time, which demonstrated moderate bilateral carotid artery  stenosis, unremarkable echocardiogram, MRI demonstrating no acute  finding and some diffuse white matter disease, and no MRA done.  He was  discharged on aspirin and Plavix and remains on those medications.  He  has had some syncope in the past, had a loop recorder placed in February  2008 for this,  which did not reveal any etiology.  He has a history of  known coronary artery disease with MI.  He has numerous other medical  problems including hypertension, COPD, and dyslipidemia.  He does have  some chronic dysphagia.   FAMILY/SOCIAL/REVIEW OF THE SYSTEMS:  Per admission H&P by Dr.  Carollee Massed note on June 17, 2007, which is reviewed.   MEDICATIONS:  At admission, he is taking Prilosec, Toprol, Plavix, baby  aspirin, Lexapro 10 mg daily, Proscar, Caduet, Xanax 0.5 mg nightly,  trazodone 50 mg nightly, gabapentin 100 mg t.i.d., B12, vitamin D,  vitamin E, potassium, and calcium.  In the hospital, he is receiving  Xanax 0.5 mg nightly, aspirin, Rocephin, Plavix, intravenous fluids,  Lovenox, Lexapro 10 mg daily, gabapentin 100 mg t.i.d., NovoLog sliding  scale insulin, magnesium, Protonix, potassium, Zocor, and several  p.r.n.'s.   PHYSICAL EXAMINATION:  VITAL SIGNS:  Temperature 98.2, blood pressure  124/58, pulse 70, respirations 18, and O2 sat is 97% on room air.  GENERAL:  This  is a healthy appearing male, supine in the hospital bed,  no evident distress.  HEENT:  Cranium normocephalic and atraumatic.  Oropharynx benign.  NECK:  Supple without carotid or subclavicular bruits.  HEART:  Regular rate and rhythm without murmurs.  NEUROLOGIC/MENTAL STATUS:  He is awake and alert.  He is oriented to  time, place, and person.  Recent and remote memory are intact.  Attention span, concentration, and fund of knowledge are all  appropriate.  Speech is mildly dysarthric but normal in content.  He has  no defect to confrontational naming and can repeat a phrase.  Cranial  nerves II-XII are equal and reactive.  Extraocular movements are full  without nystagmus.  Visual fields full to confrontation.  Hearing is  intact to conversational speech.  He has diminished pinprick sensation  on the right when compared to the left, which he says is chronic.  Face,  tongue, and palate move normally and symmetrically.  Motor, normal bulk  and tone.  Normal strength in all tested extremity muscles.  Sensation  intact to pinprick in all extremities. Coordination and rapid moves are  performed accurately.  Finger-to-nose is performed accurately.  Gait, he  is able to ambulate independently without difficulty.  Reflexes 2+ and  symmetric.  Toes are downgoing bilaterally.   LABORATORY DATA:  CBC from this morning; white count 5.9, hemoglobin  11.1, and platelets 121,000.  His BMET from this morning remarkable only  for an elevated glucose of 112.  Lipids from this morning normal.  MRI  of the brain is personally reviewed.  The study demonstrates a tiny,  also negligible, infarct in the subcortical right occipital area.  MRA  demonstrates diffuse intracranial atherosclerotic disease, nowhere  severe.  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   IMPRESSION:  1. Tiny subcortical  right occipital stroke.  This is not clinically      meaningful except as a marker for his known cerebrovascular      disease.  He is not having any clinical effect at this time.  2. Toxic-metabolic encephalopathy secondary to his urinary tract      infection.  Question role of gabapentin might have played in this      as well.  3. Hyperglycemia.  At the time when his sugar was closely 500, he had      recently had another  glucose checked which was in the 120 range,      and he did not have glucosuria.  I wonder if this was an lab error.  4. Trigeminal neuralgia.   RECOMMENDATIONS:  Continue aspirin and Plavix in his ongoing risk factor  modification, would recheck Dopplers and echos and compare them to May  2008, would expect no changes.  If his encephalopathy is not entirely  cleared with antibiotics, I might consider holding the Neurontin.  We  will follow up Mr. Esty on an as needed basis.       Michael L. Thad Ranger, M.D.  Electronically Signed     MLR/MEDQ  D:  06/18/2007  T:  06/19/2007  Job:  [id number]   cc:   C. Duane Lope, M.D.  Arturo Morton. Riley Kill, MD, [facility]  Tama Headings. Marina Goodell, M.D.

## 2010-05-27 NOTE — Assessment & Plan Note (Signed)
[facility]                         GASTROENTEROLOGY OFFICE NOTE   NAME:Flud, JAHZIAH SIMONIN                     MRN:          [id number]  DATE:05/17/2007                            DOB:          1917-12-19    Patient self-referred.   REASON FOR CONSULTATION:  Dysphagia.   HISTORY:  This is a pleasant 77 year old white male with a history of  hypertension, coronary artery disease, prior stroke, depression, kidney  stones, and reflux disease complicated by peptic stricture.  He presents  today regarding dysphagia.  The patient points to the cervical esophagus  and describes progressive problems intermittently with solid foods and  pills.  He was evaluated for similar problems in May 2004.  He  subsequently underwent a barium esophagram which revealed a small hiatal  hernia, small Zenker's diverticulum, and mildly impaired peristalsis.  No difficulty with a tablet.  He subsequently underwent upper endoscopy  June 28, 2002.  He was noted to have a distal esophageal stricture and a  hiatal hernia.  This was dilated via Savary system.  The patient reports  this helped his symptoms tremendously until the past 6-12 months.   PAST MEDICAL HISTORY:  1. Hypertension.  2. Coronary artery disease.  3. Myocardial infarction.  4. Hyperlipidemia.  5. Stroke.  6. Depression.  7. Kidney stones.  8. Cholecystectomy.  9. Hemorrhoidectomy.  10.Coronary angioplasty and reflux disease, complicated by peptic      stricture.   ALLERGIES:  LITHIUM.   CURRENT MEDICATIONS:  1. Prilosec 20 mg daily.  2. Toprol XL 25 mg daily.  3. Trazodone 50 mg daily.  4. Xanax 0.5 mg at night.  5. Plavix 75 mg daily.  6. Aspirin 81 mg daily.  7. Lexapro 10 mg daily.  8. Proscar 5 mg daily.  9. Caduet 5/20 daily.   FAMILY HISTORY:  No family history of gastrointestinal malignancy.   SOCIAL HISTORY:  The patient is married with children.  His daughter,  Juliette Alcide is a patient and  friend.  He no longer smokes or uses alcohol.  He lives with his wife.  The patient is retired from the [employer].   REVIEW OF SYSTEMS:  Per diagnostic evaluation form.   PHYSICAL EXAMINATION:  GENERAL:  A pleasant elderly male in no acute  distress.  VITAL SIGNS:  Blood pressure 124/62, heart rate 72 and regular,  respirations are 18 and unlabored.  Weight is 146.2 pounds.  He is 5  feet 8 inches in height.  HEENT:  Sclerae anicteric.  Conjunctivae are pink.  Oral mucosa is  intact.  NECK:  Posterior pharynx is unremarkable.  Thyroid is normal.  There is  adenopathy.  LUNGS:  Clear to auscultation and percussion.  HEART:  Regular without murmur.  ABDOMEN:  Soft without tenderness, mass or hernia.  Good bowel sounds  heard.  No organomegaly.  EXTREMITIES:  Without clubbing, cyanosis or edema.  Pulses are normal.  NEUROLOGIC:  He is grossly intact with normal deep tendon reflexes.   IMPRESSION:  This is an 77 year old gentleman with multiple medical  problems who presents with progressive dysphagia over the  course of 6-12  months.  Dysphagia mostly is solids and pills.  I suspect this is due to  recurrent problems with his stricture.  He is known to have a Zenker's  diverticulum, which might be more prominent at this time, and a factor.   RECOMMENDATIONS:  Will schedule outpatient upper endoscopy with  esophageal dilation.  The nature of the procedures, as well as the  risks, benefits and alternatives have been reviewed.  He understood and  agreed to proceed.  We will ask him to hold his Plavix for 5 days prior  to the procedure as we did in the past to reduce the risk of procedure  related bleeding.  However, he should continue on his daily aspirin for  cerebrovascular and cardiovascular protective purposes.  He understood.     Wilhemina Bonito. Marina Goodell, MD  Electronically Signed    JNP/MedQ  DD: 05/17/2007  DT: 05/17/2007  Job #: [id number]   cc:   C. Duane Lope, M.D.

## 2010-05-27 NOTE — H&P (Signed)
[facility] ADMISSION   NAME:[facility], Nathan Mills                       MRN:          [id number]  DATE:06/01/2006                            DOB:          03/18/1917    ADMITTING DIAGNOSIS:  Suspected stroke and congestive heart failure.   HISTORY OF PRESENT ILLNESS:  The patient is a very pleasant elderly  patient of Dr. Bonnee Quin who has a history of known coronary disease  with LV dysfunction and EF of 40-45%.  He has a history of syncope in  the past and underwent insertion of an implantable loop recorder.  He  was in his usual state of health until last night when he noted that he  had difficulty with speech and pain and weakness in his left arm.  He  also had pain in his left neck.  He denies substernal chest pain.  He  has not had frank syncope.  He does hae an ecchymotic area over his eye  and he is not quite sure how he got this.  His daughter, who is with him  today, thinks that he may have rubbed his eye too hard.  The patient  denies fevers or chills.  He denies peripheral edema.   MEDICATIONS:  Include Prilosec, trazodone, Plavix, Xanax, Lexapro,  Proscar, aspirin, Toprol-XL, hydrochlorothiazide, and Caduet.  He is  also on potassium.   FAMILY HISTORY:  Noncontributory with his advanced age.   SOCIAL HISTORY:  The patient is married.  He denies alcohol or tobacco  use at the present.   PAST MEDICAL HISTORY:  Notable for a history of strokes remotely, a  history of hemorrhoidectomy, history of renal stones in the past.   REVIEW OF SYSTEMS:  As noted in the HPI, otherwise systems reviewed and  found to be negative.   PHYSICAL EXAMINATION:  GENERAL:  He is a pleasant, chronically-ill-  appearing man who is in very mild respiratory distress.  VITAL SIGNS:  The blood pressure was 132/70, the pulse was 87 and  regular, the respirations were 18, the weight was not recorded.  HEENT:  Normocephalic and  atraumatic.  There was an area of ecchymosis  over the left inferior portion of the orbit and the lateral portion of  the orbit.  NECK:  Revealed no jugular venous distention.  There is no thyromegaly.  Trachea is midline.  The carotids are 2+ and symmetric.  LUNGS:  Clear bilaterally to auscultation except for rales in the bases  bilaterally.  There were no wheezes or rhonchi.  CARDIOVASCULAR:  Revealed a regular rate and rhythm with normal S1 and  S2.  I did not appreciate an S3.  There was a soft S4 gallop.  The PMI  was enlarged and laterally displaced.  ABDOMEN:  Soft, nontender, nondistended.  There was no organomegaly.  The bowel sounds were present and there was no rebound or guarding.  EXTREMITIES:  Demonstrated no cyanosis, clubbing or edema.  The pulses  were 2+ and symmetric.  NEUROLOGIC:  The patient could move all of his extremities well.  I  could not appreciate any clear weakness in the left hand or arm compared  to the right arm.  His speech was somewhat slurred.   IMPRESSION:  1. Probable though not certain stroke, approximately 12 hours in      duration.  2. Ischemic heart disease with left ventricular dysfunction.  3. Congestive heart failure with slight worsening.  4. History of syncope of unclear etiology status post implantable loop      recorder.  5. History of hypertension.   DISCUSSION:  Will plan to admit the patient to the hospital and have him  undergo a CT scan of the head.  We will plan on a gentle diuresis based  on results of his lab.  Will obtain serial cardiac enzymes.  Will ask  the neurologist to see him for additional evaluation.  He will be  monitored on telemetry.  He will continue on his other medications  including Plavix.  I will hold off on starting heparin at the present  time unless neurology input tells [place] otherwise.     Doylene Canning. Ladona Ridgel, MD  Electronically Signed    GWT/MedQ  DD: 06/01/2006  DT: 06/01/2006  Job #: [id number]   cc:    Arturo Morton. Riley Kill, MD, FACC  C. Duane Lope, M.D.

## 2010-05-27 NOTE — Assessment & Plan Note (Signed)
[facility]                            CARDIOLOGY OFFICE NOTE   NAME:[facility], Nathan Mills                     MRN:          [id number]  DATE:11/24/2006                            DOB:          [birth year]    This is a 77 year old, married, white male, patient of Dr. Shawnie Pons who had a TIA back in May and was hospitalized, but symptoms  resolved within 6 hours.  He was maintained on his aspirin and Plavix.  He did have carotid Dopplers that showed a 60-80%, left internal carotid  artery and a 40-60% right internal carotid artery.  Since then, he has  been doing extremely well.  He denies any chest pain, shortness of  breath, dizziness or presyncope.  His loop recorder was checked today  and had no events recorded.  [place] and has  a broken right shoulder and right upper arm and is in a splint and being  cared for by Dr. [name] Pea of [facility].   CURRENT MEDICATIONS:  1. Prilosec 20 mg daily.  2. Toprol XL 50 mg one-half daily.  3. Trazodone 50 mg daily.  4. Xanax 0.5 mg daily.  5. Plavix 75 mg daily.  6. Lexapro 10 mg daily.  7. Proscar 5 mg daily.  8. Caduet 5/20 mg daily.  9. Aspirin 81 mg daily.   PHYSICAL EXAMINATION:  GENERAL:  This is a pleasant, elderly, 77-year-  old, white male in no acute distress.  VITAL SIGNS:  Blood pressure initially was 159/69, now is 140/60, pulse  58, weight 144.  NECK:  Without JVD, bruit or thyroid enlargement.  LUNGS:  Clear to anterior, posterior and lateral.  HEART:  Regular rate and rhythm at 58 beats per minute.  Normal S1, S2.  A 1/6 systolic ejection murmur at the left sternal border.  ABDOMEN:  Soft without organomegaly, masses, lesions or abnormal  tenderness.  EXTREMITIES:  Without clubbing, cyanosis or edema.  Good distal pulses.   IMPRESSION:  1. History of unexplained syncope, status post loop recorder in 2008.      No syncope since.  2. Coronary  artery disease, status post inferior wall myocardial      infarction in 1992.  Catheterization at that time, unable to      perform angioplasty, 60% left anterior descending, 70% diagonal,      40% circumflex.  3. Transient ischemic attack in May 2008, with 60-80% left internal      carotid artery stenosis and 40-60% right internal carotid artery      stenosis.  4. Chronic obstructive pulmonary disease.  5. Hypertension.  6. Dyslipidemia.  7. History of nephrolithiasis.   PLAN:  The patient is doing well from a cardiac standpoint.  We will  order repeat carotid Dopplers to make sure there has not been any  progression of disease.  He will follow up with Dr. Riley Kill in several  months.      Jacolyn Reedy, PA-C  Electronically Signed      Arturo Morton. Riley Kill, MD, [facility]  Electronically Signed   ML/MedQ  DD: 11/24/2006  DT: 11/25/2006  Job #: [id number]

## 2010-05-27 NOTE — Assessment & Plan Note (Signed)
[facility]                            CARDIOLOGY OFFICE NOTE   NAME:Ewald, RONAV FURNEY                     MRN:          [id number]  DATE:01/31/2008                            DOB:          [birth year]    Mr. Diskin is in for a followup visit.  In general, he has been stable.  He has not been having any chest pain.  He does not have shortness of  breath or any progressive symptoms whatsoever.  Fortunately, he is no  longer driving.   MEDICATIONS:  1. Prilosec 20 mg daily.  2. Toprol-XL 50 mg one-half tablet daily.  3. Trazodone 50 mg daily.  4. Xanax 0.5 mg.  5. Plavix 75 mg daily.  6. Lexapro 10 mg daily.  7. Proscar 5 mg daily.  8. Caduet 5/20 daily.  9. Aspirin 81 mg daily.   PHYSICAL EXAMINATION:  GENERAL:  He is alert and oriented in no  distress.  VITAL SIGNS:  Blood pressure 134/64, pulse 59.  LUNGS:  Lung fields clear.  CARDIAC:  Rhythm is regular.  EXTREMITIES:  There is no extremity edema.   EKG reveals sinus rhythm with first-degree AV block, PR interval 220  milliseconds.  There is inferior infarct of indeterminate age.  There is  delay in R-wave progression, but it is not diagnostic.   Most recent laboratory studies in June 2009 revealed hemoglobin of 12,  hematocrit 35, BUN 10, creatinine 0.83, potassium 4.8.   Interrogation did not reveal any cardiac events.   IMPRESSION:  1. Coronary artery disease status post acute myocardial infarction.  2. Hypertension.  3. Hyperlipidemia.  4. Prior stroke.  5. Depression.  6. Kidney stones.  7. Cholecystectomy.  8. Hemorrhoidectomy.   PLAN:  1. Return to clinic in 1 year.  2. Continue current medical regimen.     Arturo Morton. Riley Kill, MD, [facility]  Electronically Signed    TDS/MedQ  DD: 01/31/2008  DT: 02/01/2008  Job #: [id number]

## 2010-05-27 NOTE — Consult Note (Signed)
NAME:  [facility], [facility] Mills NO.:  [id number]   MEDICAL RECORD NO.:  [id number]          PATIENT TYPE:  INP   LOCATION:  1824                         FACILITY:  [facility]   PHYSICIAN:  [facility], M.D.  DATE OF BIRTH:  Jun 14, 1917   DATE OF CONSULTATION:  06/01/2006  DATE OF DISCHARGE:                                 CONSULTATION   HISTORY OF PRESENT ILLNESS:  Nathan Mills is an 77 year old right  handed white male born 21-Jun-1917 with a history of bradycardia and  syncope, history of cerebrovascular disease in the past with a left  brain parietal stroke.  This patient returns to the [facility] Emergency  Room at this point with onset of slurred speech, claiming the left arm  felt somewhat funny and some gait disturbance that began last evening  around 8:00 P.M.  The patient went to bed instead of coming to the  hospital and this morning was a little bit worse with the walking and  the talking.  The patient denies any headache or visual field  disturbance, has had chronic swallowing problems but nothing new.  The  patient denies any dizziness or loss of consciousness.  The patient  underwent a CT scan of the brain which shows the left parietal stroke  that is old but no new acute changes were seen.  Neurology was asked to  see this patient for further evaluation.  The patient scores a 4 on the  NIH stroke scale.  He is not a TPA candidate due to duration of  symptoms.   PAST MEDICAL HISTORY:  Significant for:  1. Onset of left arm altered sensation, gait disorder, slurred speech.  2. Bradycardia with syncope, has a loop recorder in place currently.  3. Hypertension.  4. Renal calculus surgery.  5. Cataract surgery.  6. Laser surgery on left eye.  7. Hemorrhoid surgery.  8. Trigeminal neuralgia with surgery x 2, chronic numbness on the      right face.  9. Dyslipidemia.  10.Urinary tract infections.  11.History of prior stroke in the left parietal areas  above.  12.Gallbladder surgery.  13.History of ischemic cardiomyopathy with ejection fraction in 40-      50%.  14.History of myocardial infarction.  15.History of CABG procedure.   ALLERGIES:  The patient has NO KNOWN ALLERGIES.   SOCIAL HISTORY:  The patient does not currently smoke or drink.   MEDICATIONS:  Current medications prior to admission included:  1. Prilosec 20 mg daily.  2. Trazodone 50 mg at night.  3. Plavix 75 mg a day.  4. Xanax 0.5 mg at night.  5. Lexapro 10 mg daily.  6. Proscar 5 mg daily.  7. Aspirin 81 mg daily.  8. Toprol XL 50 mg 0.5 tablet daily.  9. Hydrochlorothiazide 25 mg 0.5 tablet daily.  10.Caduet 5/20 mg daily.  11.Potassium supplementation.   SOCIAL HISTORY:  The patient lives in the [place] area, is married  and has 2 children who are alive and well.  The patient is retired.   FAMILY MEDICAL HISTORY:  Notable that mother died  with cancer and also  had trigeminal neuralgia.  Father passed away, had history of pneumonia.  The patient has 2 brothers, 3 sisters, only 2 siblings remain. The rest  of the siblings all died with cancer.   REVIEW OF SYSTEMS:  Notable for no recent fevers, chills.  The patient  does note some wheezing overnight, denies headache, does have some left  neck pain that began the day prior to admission.  The patient feels that  the left arm is feeling funny, has walking problems as above, slurred  speech, chronic swallowing problems, denies chest pain, abdominal pain.  He had nausea yesterday but not today and denies any problems  controlling the bowel or bladder, does have frequently urinary tract  infection.  The patient denies any recent blackout episodes.   PHYSICAL EXAMINATION:  VITAL SIGNS:  Blood pressure 150/77, heart rate  86, respiratory rate 24, temperature afebrile.  IN GENERAL:  This patient is a fairly well developed, elderly white male  who is alert, cooperative at the time of examination.  HEENT:   Examination of the head is atraumatic. Eyes, pupils are post  surgical, round, reactive to light, disks are flat bilaterally.  NECK:  Neck is supple, no carotid bruits noted.  RESPIRATORY:  Examination is clear.  CARDIOVASCULAR;  Examination reveals a regular rate and rhythm.  No  obvious murmurs or rubs noted.  EXTREMITIES:  Without significant edema.  NEUROLOGIC EXAMINATION:  Cranial nerves as above. Facial symmetry is not  present.  The patient has depression of the right nasolabial fold, has  decreased pinprick sensation of the right face compared to the left.  Extraocular movements again in full, visual fields are full.  Speech is  dysarthric, no aphasic.  Motor testing reveals good strength on all 4s,  direct testing.  Good motor tones noted throughout.  Sensory testing is  intact to pinprick, soft touch, vibration and proprioception throughout.  The patient, however, extincts to double simultaneous stimulation on the  left leg as compared to the right, not on the arms.  The patient has  fairly good finger-nose-finger and heel-shin bilaterally.  No drift is  seen on the arms or the legs.  Deep tendon reflexes repressed but  symmetric. Toes are neutral bilaterally.  The patient was not ambulated.  Again, the patient does have dysarthria.  NI stroke field was 4.   LABORATORY VALUES:  Notable for a white count of 14.5, hemoglobin of  12.3, hematocrit 35.7, MCV of 93.4, platelets of 134, INR of 1.0, sodium  of 140, potassium 3.9, chloride 106, CO2 23, glucose 224, BUN 25,  creatinine 1.07.  AST is 20, ALT of 10. Total protein of 6.4, albumin  3.5, calcium 8.9, CK 25, MB fraction 0.7, Troponin I 0.02.  BNP is 781.   IMPRESSION:  1. New onset of slurred speech, gait disturbance, probable stroke      event.  2. Hypertension.  3. Coronary artery disease.  4. Dyslipidemia.   This patient does have multiple risk factors for stroke or event.  The patient is not a TPA candidate due to  duration of symptoms.  The patient  has dysarthria and gait disturbance as the primary symptoms and may have  suffered a small subcortical right brain stroke or possibly a brain stem  event.  Will proceed with further workup at this point.   PLAN:  1. Pursue MRI scan of the brain, if this is compatible with the loop  recorder in place.  2. MRI angiogram of intracranial-extracranial vessels.  3. 2D echocardiogram is ordered.  4. Physical and occupational therapy evaluation.  5. Continue aspirin and Plavix for now.  6. Bedside swallow evaluation prior to feeding.  Will follow the      patient course while in house.      [facility], M.D.  Electronically Signed     CKW/MEDQ  D:  06/01/2006  T:  06/01/2006  Job:  [id number]   cc:   Doylene Canning. Ladona Ridgel, MD  C. Duane Lope, M.D.  [address] [facility]

## 2010-05-27 NOTE — Discharge Summary (Signed)
NAME:  [name], [name]:  [id number]   MEDICAL RECORD NO.:  [id number]          PATIENT TYPE:  INP   LOCATION:  4710                         FACILITY:  [facility]   PHYSICIAN:  Doylene Canning. Ladona Ridgel, MD    DATE OF BIRTH:  1917-09-12   DATE OF ADMISSION:  06/01/2006  DATE OF DISCHARGE:  06/03/2006                               DISCHARGE SUMMARY   THE PATIENT HAS AN ALLERGY TO LITHIUM.   Discharge greater than 40 minutes.   FINAL DIAGNOSES:  1. Right brain transient ischemic attack.      a.     Admitted with sudden onset left arm numbness and gait       ataxia.      b.     Computed tomogram of the head with no evidence of       hemorrhage, mass, lesion, or acute infarct.      c.     MR of the brain with atrophy, chronic small vessel ischemic       changes, no acute abnormality.  2. The patient has met physical therapy expectations and is cleared by      them for discharge.   SECONDARY DIAGNOSES:  1. History of unexplained syncope, status post loop recorder implant      February 2008, no syncope since February 2008.  2. History of inferior myocardial infarction in 1992, at catherization      unable to perform percutaneous transluminal coronary angioplasty of      the right coronary artery.  3. At catheterization 1992, the LAD had a 60% stenosis after the first      septal perforator.  The diagonal had a 70% stenosis.  The left      circumflex had a 40% stenosis.  4. Chronic obstructive pulmonary disease.  5. Hypertension.  6. Dyslipidemia.  7. Status post cholecystectomy.  8. Hemorrhoidectomy.  9. History of nephrolithiasis.   PROCEDURES:  This admission:  1. CT of the head, Jun 01, 2006, dictated above.  2. MR of brain, Jun 01, 2006, dictated above.  3. A 2-D echocardiogram was done Jun 01, 2006:  Ejection fraction was      50%, trivial aortic regurgitation, mild mitral regurgitation.  4. Carotid duplex ultrasound done Jun 03, 2006.  The study shows the  right internal carotid artery has a 40-60% stenosis.  The left      internal carotid artery has a 60-80% stenosis.   BRIEF HISTORY:  [name] is an 77 year old, right-handed, white male.  He has a history of bradycardia, syncope, cerebrovascular disease with a  history of left brain parietal stroke. The patient returns to [facility]  Emergency Room with a sudden onset of slurred speech, numbness in the  left arm, and some gait disturbance.  This began the evening prior to  this discharge, May 31, 2006.  The patient went to bed instead of coming  to the hospital, and then in the morning on Jun 01, 2006, his gait was a  little worse and so was his speech.  The patient denies any headache or  visual  field disturbance, and has had chronic swallowing problems, but  that is nothing new.  The patient denies any dizziness or loss of  consciousness.  The patient underwent a CT of the brain which shows left  parietal stroke that is old but been no new acute changes.  The patient  is not a t-PA candidate due to duration of symptoms.   The patient does have risk factors for stroke or TIA.  He is not t-PA  candidate due to the duration of symptoms.  The patient does have  dysarthria and gait disturbances as his primary symptoms and may have  suffered a small subcortical right brain stroke or possibly a brain stem  event.  We will proceed with a further workup to include MRI scan of the  brain, possibly MRI angiogram of the intracranial and extracranial  vessels, a 2-D echocardiogram, physical therapy and occupational therapy  evaluations, bedside swallow prior to feeding.  The patient is to  continue on aspirin and Plavix which are home medications for him.   HOSPITAL COURSE:  The patient presented on Jun 01, 2006, after  experiencing left arm numbness or odd sensation plus some speech  slurring and gait ataxia on the evening of May 31, 2006.  He was seen on  admission by Dr. Lesia Sago, who  recommended a CT of the head and MRI  of the brain. Both of these studies have shown no acute process.  The  patient has a history of old left parietal infarct which did show up on  the CT of the brain and neurology, aside from ordering these initials  scans, will follow throughout this hospitalization.  A 2-D  echocardiogram was completed.  It showed an ejection fraction of 50%,  trivial aortic regurgitation, and a mild mitral regurgitation.  The  patient's symptoms resolved approximately 6 hours after his admission to  the hospital.  He has had no more symptoms since that time.  He has been  seen consistently by physical therapy here who states that he has met  all their goals during this hospitalization but they do recommend home  health physical therapy which will be set up in an effort to help with  gait training.  Prior to discharge the patient had carotid duplex  studies which showed 40-60% stenosis on the right internal carotid  artery and a 60-80% stenosis in the left internal carotid artery.   His troponin I studies here were negative at 0.02, then 0.02.  His other  laboratory studies on the day of discharge:  His hemoglobin was 12.3,  hematocrit 35.7, white cells 14.5, platelets 134.  His serum  electrolytes were sodium 140, potassium 3.9, chloride 106, carbonate 23,  BUN is 25, creatinine 1.07, and a glucose at 9:59 in the morning was  224.  Stool Hemoccult was negative.  Homocystine value was 11.5, the  range being 4 to 15.4.  Hemoglobin A1c was 6.1.  TSH 0.582.  The BNP on  admission was 781.     Once again, the patient has allergy to lithium.   Greater than 40 minutes for this exam and discharge.   Of note, home health physical therapy had been ordered.  The patient,  upon discussion mentioned that he is quite able to walk independently  and has been doing so prior to this hospitalization and feels that he is at his pre admit level of ambulation upon discharge.  The  physical  therapy outpatient consult will then be declined  and discontinued.   Patient discharging on Jun 03, 2006, on the following medications:  1. Caduet 5/40, one tab daily.  2. Enteric coated aspirin 325 mg daily.  3. Plavix 75 mg daily.  4. Lexapro 10 mg daily.  5. Proscar 5 mg daily.  6. Toprol XL 25 mg daily.  7. Prilosec 10 mg daily.  8. Desyrel 50 mg at bedtime.   He follows up at  [facility], [address] to see  Dr. Riley Kill, Monday, July 12, 2006 at 12:15.      Maple Mirza, PA      Doylene Canning. Ladona Ridgel, MD  Electronically Signed    GM/MEDQ  D:  06/03/2006  T:  06/03/2006  Job:  [id number]   cc:   Marlan Palau, M.D.  Arturo Morton. Riley Kill, MD, FACC  C. Duane Lope, M.D.

## 2010-05-27 NOTE — H&P (Signed)
NAME:  Nathan Mills, Nathan Mills NO.:  [id number]   MEDICAL RECORD NO.:  [id number]          PATIENT TYPE:  INP   LOCATION:  3736                         FACILITY:  [facility]   PHYSICIAN:  Ramiro Harvest, MD    DATE OF BIRTH:  09/24/1917   DATE OF ADMISSION:  06/17/2007  DATE OF DISCHARGE:                              HISTORY & PHYSICAL   PRIMARY CARE PHYSICIAN:  Miguel Aschoff, M.D.   CARDIOLOGIST:  Arturo Morton. Riley Kill, MD, [facility].   GASTROENTEROLOGIST:  Wilhemina Bonito. Marina Goodell, MD.   HISTORY OF PRESENT ILLNESS:  Nathan Mills is an 77 year old gentleman  with a history of a TIA, coronary artery disease status post MI, COPD,  hypertension, and GERD with peptic stricture who presents to the ED with  1-week history of increasing frequency of urination, dysuria, and  generalized weakness.  No hematuria.  No discharge.  No polyphagia.  The  patient states 5 days prior to admission was also having increased pain  with his trigeminal neuralgia and his neurologist at [facility]  prescribed him gabapentin, which had some mild relief with that.  On the  morning of admission, the patient got up to use the bathroom and his  legs gave out on him and he fell.  The patient denies any trauma to the  head.  No loss of consciousness.  No dizziness.  No chest pain.  No  palpitations.  No shortness of breath.  No cough and no fall while  urinating.  The patient made his way back to his bed, woke up a little  later on the morning of admission and while taking the shower, he states  his legs gave out again and he fell.  Denies any loss of consciousness.  No chest pain.  No shortness of breath.  No palpitations.  No visual  field defects.  The patient does endorse weakness and some slurred  speech, but however, no other focal neurological symptoms.  The  patient's wife called 911, and the patient was brought to the ED.  Per  EMS, the patient's CBGs were greater than 100, on arrival approximately  114.   In the ED, the patient was found to be dehydrated, elevated, and  CBG's in the 500.  No urinary tract infection.  Head CT which was  negative.  The patient was put on the Glucommander and we are called to  admit the patient for further evaluation and management.  The patient's  wife gave the patient 2 tablets of her Macrobid as she felt that the  patient not have a urinary tract infection.   ALLERGIES:  No known drug allergies.   PAST MEDICAL HISTORY:  1. History of TIA in May 2008 with a 60-80% left ICA stenosis and 40-      60% right ICA stenosis.  2. History of unexplained syncope, status post loop recorder      implanting marked in February 2008.  3. Coronary artery disease, status post inferior MI in 1992, cath at      that time was 60% LAD, 70% diagonal, and 40% circumflex.  4. COPD.  5. Hypertension.  6. Dyslipidemia.  7. Status post cholecystectomy.  8. Status post hemorrhoidectomy.  9. History of nephrolithiasis.  10.Depression.  11.Gastroesophageal reflux disease with peptic stricture and recent      dilatation done a week ago.  12.Small hiatus hernia.  13.Small Zenker's diverticula.   MEDICATIONS:  1. Prilosec 10 mg daily.  2. Toprol-XL 50 mg daily.  3. Plavix 75 mg daily.  4. Aspirin 81 mg daily.  5. Lexapro 10 mg daily.  6. Proscar 5 mg daily.  7. Caduet 5/20 mg daily.  8. Alprazolam 0.5 mg nightly.  9. Trazodone 50 mg daily.  10.Gabapentin 100 mg t.i.d.  11.Vitamin E daily.  12.Vitamin B12 daily.  13.Vitamin D daily.  14.Citrucel tablets daily.  15.Klor-Con 10 mEq daily.   SOCIAL HISTORY:  The patient is married.  Lives in [place] with his  wife.  He is retired from the [employer].  Prior tobacco history,  quit about 40 years ago.  No alcohol use.  No IV drug use.  The patient  has two children; two daughters, one with a history of eye cancer.   FAMILY HISTORY:  Noncontributory.   REVIEW OF SYSTEMS:  As per HPI.   PHYSICAL EXAMINATION:  VITAL  SIGNS:  Temperature 100.6, blood pressure  161/62, pulse of 92, respiratory rate 28, and sating 92% on room air.  GENERAL:  The patient with some garbled/slurred speech.  HEENT:  Normocephalic and atraumatic.  Pupils are equal, round, and  reactive to light.  Extraocular movements intact.  Extremely dry mucous  membranes.  NECK:  Supple.  No lymphadenopathy.  RESPIRATORY:  Lungs are clear to auscultation bilaterally.  No wheezes.  No rhonchi.  No crackles.  CARDIOVASCULAR:  Regular rate and rhythm.  No murmurs, rubs, or gallops.  ABDOMEN:  Soft, nontender, and nondistended.  Positive bowel sounds.  EXTREMITIES:  No clubbing, cyanosis, or edema.  NEUROLOGIC: The patient is alert and oriented x3.  The patient does have  some slurred speech.  Visual fields are intact.  Rest of cranial nerve  exam is intact.  Sensation is intact, 4-5/5 bilateral upper and lower  extremity strength.  Cerebellum is intact.  Unable to elicit reflexes  diffusely.  Gait not tested secondary to safety.   LABS:  White count 11.7, hemoglobin 12.0, platelets 141, hematocrit  35.4, and ANC of 10.8.  Sodium 132, potassium 3.3, chloride 93, bicarb  31, BUN 12, creatinine 1.11, glucose of 554, and calcium of 8.3.  Head  CT, no acute findings, stable appearance of small vessel disease and  lacunes.  UA yellow, cloudy, specific gravity 1.019, pH of 8.  Glucose  negative.  Bilirubin negative.  Ketones negative.  Blood negative.  Protein negative, urobilinogen 0.2, nitrite negative, leukocytes  moderate, micro WBC 21 through 50, RBC 3-6, and many bacteria.   ASSESSMENT AND PLAN:  [name] is an 77 year old gentleman  with history of coronary artery disease, status post myocardial  infarction, history of prior transient ischemic attack, and history of  depression who presents to the ED with falls and extreme dehydration and  hyperglycemia.  1. Falls likely secondary to dehydration and weakness versus       infectious etiology versus acute coronary syndrome versus      neurological etiology.  We will admit the patient to telemetry,      cycle cardiac enzymes q.8h. x3, check a chest x-ray, check an MRI      of the head to rule  out cerebrovascular accident.  We will hydrate      with IV fluids, place on empiric antibiotics of Rocephin for      probable urinary tract infection, while awaiting urine cultures.      PT and OT evaluate and treat.  2. Severe dehydration.  We will place on IV fluids and monitor.  3. Hyperglycemia, questionable etiology.  No prior history of      diabetes. We will check a hemoglobin A1c.  Check a hepatic panel.      Check a serum osmolality.  Check a serum ketone level.  Check CBGs      every 2 hours.  Check magnesium level.  Hydrate with IV fluids,      sliding scale insulin, and follow.  4. Probable urinary tract infection.  Check a urine culture, place on      Rocephin IV until cultures return.  5. Hypertension, hold blood pressure medications secondary to severe      dehydration.  6. Hyperlipidemia, atorvastatin.  7. Slurred speech, likely secondary to extreme dehydration versus      cerebrovascular accident.  Head CT has been negative.  We will      cycle cardiac enzymes q.8h. x3.  Check a homocystine level.  Check      MRI of the head to rule out cerebrovascular accident, bedside      swallow evaluation.  Place the patient on IV fluids and aspirin.      If MRI is positive for cerebrovascular accident, we will do a      further stroke workup including carotid Dopplers and 2-D echo. We      will consult with Neurology then.  8. Coronary artery disease status post myocardial infarction, stable.      Continue statin, hold beta-blocker and Norvasc for now and monitor.  9. History of transient ischemic attack, continue on Plavix and      aspirin.  10.Depression, Lexapro.  11.Trigeminal neuralgia, continue on home dose gabapentin.  12.Prophylaxis Protonix for  gastrointestinal prophylaxis.  Lovenox for      deep venous thrombosis prophylaxis.   It has been a pleasure taking care of [name].      Ramiro Harvest, MD  Electronically Signed     DT/MEDQ  D:  06/17/2007  T:  06/18/2007  Job:  [id number]   cc:   Miguel Aschoff, M.D.

## 2010-05-30 NOTE — Procedures (Signed)
DESCRIPTION:  A posterior dominant rhythm of 8 hertz can be seen emitting  from both posterior hemispheres equally. There is no focal slowing and no  epileptiform discharges seen in the awake stages. Drowsiness is soon  reached, indicated by frontal slowing bilaterally which reaches also both  central regions. Here, the frequencies are now 6 to 8 hertz. No sleep is  recorded, and no epileptiform discharges are seen during photic stimulation  which did not lead to entrainment at all. The EKG which truly consists only  of a rhythm strip showed occasional PVCs.      QQ:VZDG  D:  12/17/2003 17:55:00  T:  12/18/2003 12:53:44  Job #:  [id number]

## 2010-05-30 NOTE — Assessment & Plan Note (Signed)
[facility]                            CARDIOLOGY OFFICE NOTE   NAME:Nathan Mills, Nathan Mills                     MRN:          [id number]  DATE:03/24/2006                            DOB:          September 04, 1917    CARDIOLOGIST:  Nathan Mills.   PRIMARY CARE PHYSICIAN:  Nathan Mills.   HISTORY OF PRESENT ILLNESS:  Nathan Mills is a very pleasant 77 year old  male patient with a history of coronary artery disease, status post  inferior myocardial infarction 1992 associated with cardiogenic shock  treated with attempted angioplasty of the RCA that was unsuccessful and  essentially treated medically after that who has an ischemic  cardiomyopathy with an EF of 40% - 50%, who has unexplained syncope.  He  was set up for implantable loop recorder by Nathan Mills.  This was done  on March 01, 2006.  The patient was due to see Nathan Mills back last  week.  He actually went to the emergency room with nausea, abdominal  discomfort, and weakness.  Nathan Mills asked that he have his  appointment rescheduled for today.  The patient's nausea and weakness is  actually resolved.  He saw his primary care physician the next day.  No  medication changes were made.  In the emergency room his laboratory  testing revealed a slightly elevated white count at 12,200, hemoglobin  was normal at 13.4, potassium was 3.9, BUN 30, creatinine 1.04, LFTs  okay, point of care markers negative x1, urinalysis was negative.  Chest  x-ray showed chronic lung changes, COPD, and no acute pulmonary  findings.  His implantable loop recorder had several strips available  for me to review today.  I reviewed these actually with Nathan Mills.  It  appears that Nathan Mills is having sinus rhythm with occasional PACs with  rates somewhere in the 80s.  He denies any palpitations.  He denies any  further syncope or near syncope.  Denies any chest pain.  He thinks he  is a little bit more short of breath  with over exertion over the last 6  months.  He denies orthopnea, paroxysmal nocturnal dyspnea.  Denies any  lower extremity edema.   CURRENT MEDICATIONS:  1. Prilosec 10 mg daily.  2. Trazodone.  3. Plavix 75 mg daily.  4. Xanax 0.5 mg.  5. Lexapro 10 mg daily.  6. Proscar 5 mg daily.  7. Aspirin 81 mg daily.  8. Toprol XL 50 mg 1/2 tablet daily.  9. HCTZ 25 mg 1/2 tablet daily.  10.Caduet 5/20 mg daily.  11.Potassium.   ALLERGIES:  LITHIUM.   PHYSICAL EXAMINATION:  He is a well-nourished, well-developed male in no  distress.  Blood pressure is 126/54, pulse 68, weight 150.08 pounds.  HEENT:  Unremarkable.  NECK:  Without JVD.  CARDIO:  S1, S2, regular rate and rhythm without murmurs.  LUNGS:  Clear to auscultation bilaterally without wheezing, rhonchi, or  rales.  ABDOMEN:  Soft, nontender.  EXTREMITIES:  Without edema, calves are soft, nontender.  NEUROLOGIC:  He is alert and oriented x3, cranial nerves II-XII grossly  intact.  ELECTROCARDIOGRAM:  Reveals sinus rhythm with a heart rate of 60,  inferior Q waves, no significant change since previous tracings.  LOOP RECORDER TRACINGS:  As noted above.   IMPRESSION:  1. Mild dyspnea on exertion.  2. Recent history of weakness and nausea - resolved.  3. Coronary artery disease.      a.     Status post myocardial infarction 1992 with attempted       angioplasty of the right coronary artery that was unsuccessful -       medical therapy.      b.     Catheterization in 1992 revealed left anterior descending       after the septal perforator on the first diagonal 60%, first       diagonal 70%, left circumflex 40%, proximal distal 70%, mid right       coronary artery totaled.  4. Ischemic cardiomyopathy with an ejection fraction of 40% - 50% by      recent echocardiogram.  5. Hypertension.  6. Hyperlipidemia.  7. History of transient ischemic attacks.  8. History of cholecystectomy.  9. History of hemorrhoidectomy.   10.History of nephrolithiasis.  11.History of unexplained syncope; s/p implantable loop recorder      a.     sinus rhythm with PACs on recent recordings   PLAN:  As noted above, I reviewed the tracing from the loop recorder  with Nathan Mills.  He is having sinus rhythm with some PACs.  This has  apparently been known in the past.  The patient does admit to some  increased dyspnea on exertion over the last 6 months.  He denies any  chest pain or symptoms reminiscent of his previous myocardial  infarction.  It has been several years since he has had a functional  study.  I recommend we go ahead and proceed with an adenosine Myoview  study to rule out the possibility of ischemia.  We will also get a BMET  and a BNP level today since he does have reduced LV function.  If his  BNP is significantly elevated we can certainly either adjust his HCTZ or  add Lasix.  I will bring the patient back in close followup with Dr.  Riley Mills in the next couple of weeks.   Addendum:  The patient told my nurse after our encounter that he would  prefer to hold off on the myoview until I could confer with Nathan Mills.  I will be in touch with Nathan Mills regarding this.      Nathan Newcomer, PA-C  Electronically Signed      Nathan Sans. Daleen Squibb, MD, [facility]  Electronically Signed   SW/MedQ  DD: 03/24/2006  DT: 03/26/2006  Job #: [id number]   cc:   Nathan Mills, M.D.
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
## 2010-05-30 NOTE — Discharge Summary (Signed)
NAME:  Nathan Mills, Nathan Mills NO.:  [id number]   MEDICAL RECORD NO.:  [id number]          PATIENT TYPE:  INP   LOCATION:  3736                         FACILITY:  [facility]   PHYSICIAN:  Ramiro Harvest, MD    DATE OF BIRTH:  07/18/1917   DATE OF ADMISSION:  06/17/2007  DATE OF DISCHARGE:  06/21/2007                               DISCHARGE SUMMARY   PRIMARY CARE PHYSICIAN:  C. Duane Lope, M.D. of [facility].   CARDIOLOGIST:  Arturo Morton. Riley Kill, MD, [facility].   NEUROLOGIST:  Marolyn Hammock. Thad Ranger, MD   GASTROENTEROLOGIST:  Wilhemina Bonito. Marina Goodell, MD   DISCHARGE DIAGNOSIS:  1. Small acute right occipital infarct.  2. Toxic metabolic encephalopathy thought to be secondary to urinary      tract infection/versus Neurontin.  3. Hyperglycemia.  4. Trigeminal neuralgia.  5. Urinary tract infection secondary to Enterococcus.  6. Falls.  7. Hypertension.  8. Coronary artery disease status post myocardial infarction.  9. Depression.  10.Anemia thought to be dilutional.  11.Gastroesophageal reflux disease with a history of peptic stricture      and dilatation 1 week prior to admission.  12.Small hiatal hernia.  13.Small Zenker's diverticulum.  14.History of nephrolithiasis.  15.Dyslipidemia.  16.Status post cholecystectomy.  17.Status post hemorrhoidectomy.  18.History of chronic obstructive pulmonary disease.  19.Severe dehydration.   DISCHARGE MEDICATIONS:  1. Amoxicillin 500 mg p.o. t.i.d. x5 days.  2. Prilosec 10 mg p.o. daily.  3. Toprol 50 mg p.o. daily.  4. Trazodone 50 mg p.o. daily.  5. Xanax 0.5 mg p.o. daily.  6. Plavix 75 mg p.o. daily.  7. Lexapro 10 mg p.o. daily.  8. Proscar 5 mg p.o. daily.  9. Caduet 5/20 p.o. daily.  10.Aspirin 81 mg p.o. daily.  11.Vitamin D p.o. daily.   DISPOSITION AND FOLLOWUP:  The patient will be discharged home.  The  patient is to follow up with his primary care physician, Dr. Duane Lope  of [facility], in 2 weeks.  On followup  the patient's  hyperglycemia needs to be reassessed as well as the patient's  hypertension.  A repeat urinalysis will be needed to monitor for  resolution of urinary tract infection.   HOSPITAL CONSULTATIONS:  A neurology consult was done.  The patient was  seen in consultation by Dr. Thad Ranger of [facility] on June 17, 2007.   PROCEDURES PERFORMED:  A CT of the head without contrast was performed  on June 17, 2007, that showed no acute findings, stable appearance of  small vessel disease and lacunes.  Chest x-ray was performed on June 17, 2007, that showed cardiomegaly and  pulmonary vascular congestion without edema, COPD/emphysema.  MRI/MRA of the head and neck was performed on June 17, 2007, which showed  a small acute nonhemorrhagic right occipital lobe infarct, atrophy  without hydronephrosis, moderate small vessel disease type changes,  moderate intracranial atherosclerotic type changes, right vertebral  artery ends in a PICA distribution, suggestion of narrowing of the  proximal aspect of the vertebral arteries bilaterally.  This is a common  area for artifact and therefore grading stenosis  accurately is not  possible.  Left internal carotid artery narrowing just below 50% as  described above.  No significant narrowing of the right carotid  bifurcation.  Carotid Dopplers were done on June 20, 2007, that showed bilateral  moderate calcific plaquing bifurcations, 40-60% ICA stenosis, vertebral  artery flow antegrade.  2-D echo was performed on June 20, 2007, which showed overall left  ventricular systolic function was mildly decreased, LVEF 45-50%.  There  was aneurysmal deformity of the basal mid inferior wall.  There was  hypokinesis of the mid distal septal wall, left ventricular wall  thickness was mildly increased.  There was mild aortic valvular  regurgitation.  There was mild mitral annular calcification.  There was  mild to moderate mitral valvular regurgitation.  The  left atrium was  mildly dilated.   BRIEF HOSPITAL ADMISSION HISTORY AND PHYSICAL:  [name] is an 77-year-old gentleman  old gentleman with history of TIA, coronary artery disease status post  MI, COPD, hypertension, GERD with peptic stricture who presented to the  ED with a 1 week history of increasing frequency of urination, dysuria,  generalized weakness.  No hematuria, no discharge.  No polyphagia.  The  patient stated 5 days prior to admission he was also having increased  pain with his trigeminal neuralgia and his neurologist at [facility] prescribed gabapentin which had some mild relief with that.  On  the morning of admission the patient got up to use the bathroom and his  legs gave out on him and he fell.  The patient denies any trauma to the  head.  No loss of consciousness, no dizziness, no chest pain or  palpitations.  No shortness of breath, no cough and he did not fall  while he was urinating.  The patient made his way back to his bed and  woke up a little later in the morning of admission and while taking a  shower he states the leg gave out on him again and he fell.  The patient  denied any loss of consciousness.  No chest pain, no shortness of  breath, no palpitations, no visual field defect.  The patient did  endorse some weakness and some slurred speech but however no other focal  neurological symptoms.  The patient's wife called 9-1-1 and the patient  was brought to the emergency department.  EMS report the patient's CBGs  were greater than 100 on arrival approximately 114 in the ED.  The  patient was found to be dehydrated and have elevated CBGs in the 500s.  No urinary tract infection was noted.  Head CT was negative.  The  patient was placed on a glucommander and we were called to admit the  patient for further evaluation and management.  The patient's wife gave  the patient 2 tablets of Macrobid as she felt that the patient might be  having a urinary tract  infection.   PHYSICAL EXAM:  VITAL SIGNS:  Temperature 100.6, blood pressure 161/62,  pulse of 92, respiratory rate 28, satting 92% on room air.  GENERAL:  The patient with some slurred speech and garbled speech  incomprehensible.  HEENT:  Normocephalic, atraumatic.  Pupils equal, round and reactive to  light.  Extraocular movements intact.  Extreme dry mucous membranes.  NECK:  Neck was supple.  No lymphadenopathy.  RESPIRATORY:  Lungs were clear to auscultation bilaterally.  No wheezes,  no rhonchi, no crackles.  CARDIOVASCULAR:  Regular rate and rhythm.  No murmurs, rubs  or gallops.  ABDOMEN:  Abdomen was soft, nontender, nondistended, positive bowel  sounds.  EXTREMITIES:  No clubbing, cyanosis or edema.  NEUROLOGICAL:  The patient was alert and oriented x3.  The patient did  have some slurred speech.  Visual fields were intact.  Rest of cranial  nerve exam was normal.  Sensation was intact 4-5/5 bilateral upper  extremity and bilateral lower extremity strength.  Cerebellum was  intact.  Unable to elicit reflexes diffusely.  Gait not tested secondary  to safety.   ADMISSION LABS:  CBC - white count 11.7, hemoglobin 12.0, platelets 141,  hematocrit 35.4, ANC of 10.8, sodium 132, potassium 3.3, chloride 93,  bicarb 31, BUN 12, creatinine 1.11, glucose of 554, calcium of 8.3.  Head CT no acute findings, stable appearance of small vessel disease and  lacune.  UA was yellow, cloudy, specific gravity 1.019, pH of 8, glucose  negative, bilirubin negative, ketones negative, blood negative, protein  negative, urobilinogen 0.2, nitrite negative, leukocytes moderate.  Microscopy 21-50 WBCs, 3-6 RBCs and many bacteria.   HOSPITAL COURSE:  1. Small acute right occipital infarct.  The patient was admitted and      during his admission the patient was noted to have some slurred      speech and as such a neurological workup was undertaken to rule out      a CVA.  The patient was placed on  telemetry.  Swallow evaluation      was also obtained.  MRI, CT of the head had been obtained in the ED      department which was negative.  MRI, MRA of the head was also      obtained which showed a small acute right occipital infarct.      Carotid Dopplers were also obtained.  Homocysteine levels, 2-D echo      was also obtained with results as stated above.  PT, OT was      consulted to see the patient.  A neurology consult was obtained.      Neurology felt that the patient's small acute right occipital      infarct was meaningless and not the cause of the patient's slurred      speech, felt the patient's slurred speech was likely secondary to      his urinary tract infection versus a recent start on Neurontin.      The patient was followed during the hospitalization and the patient      did not have any significant neurological deficits.  The patient's      speech improved throughout the hospitalization and the patient was      back at baseline by the time of discharge.  The patient was      discharged in stable and improved condition.  2. Toxic metabolic encephalopathy thought to be secondary to UTI      versus neuropathy.  The patient was admitted.  The patient did have      slurred speech on admission and as such a neurological workup was      instituted which showed problem #1.  Neurology was consulted and      felt his slurred speech was secondary to a toxic metabolic      encephalopathy likely secondary to his urinary tract infection and      recent start of Neurontin.  As such the patient's Neurontin was      held throughout the hospitalization.  The patient was treated on IV  Rocephin until urine cultures came back with Enterococcus species.      The patient was then switched to oral antibiotics.  The patient's      speech improved throughout the hospitalization.  The patient was      back at his baseline prior to discharge.  The patient remained in      stable and  improved condition.  The patient was discharged home to      complete a 7 day course of amoxicillin as an outpatient.  The      patient will follow up with his PCP and will need a recheck of his      urinalysis for resolution of his urinary tract infection.  The      patient is also to stay away from Neurontin as this may have had a      component with his presentation.  The patient will follow up with      his neurologist at the [facility].  The patient was      discharged in stable and improved condition.  3. Hyperglycemia.  On admission labs it was noted the patient to have      a hemoglobin of 500.  The patient did not have any history of      diabetes.  Glycohemoglobin level was obtained which came back at      6.8.  The patient remained stable and by day of discharge the      patient's glucose levels were back to normal.  This will need to be      followed up as an outpatient.  4. Trigeminal neuralgia stable throughout the hospitalization.  The      patient's Neurontin was discontinued.  The patient remained      asymptomatic throughout hospitalization.  5. Urinary tract infection.  As noted per admission urinalysis the      patient was thought to have a urinary tract infection which may      have contributed to problem #2.  The patient was initially placed      on IV Rocephin until culture results came back.  The patient was      treated for about a 4-5 day of IV Rocephin and urine cultures came      back as Enterococcus species.  The patient was then switched to      amoxicillin and will complete a 7 day course of amoxicillin for his      urinary tract infection.  A repeat urinalysis will need to be      obtained for resolution of urinary tract infection.   The rest of the patient's chronic medical issues remained stable  throughout the hospitalization.  The patient was maintained on his home  regimen of medications.  The patient was discharged in stable and  improved  condition to follow up with his primary care physician as  stated above.  On the day of discharge vital signs were temperature  97.9, blood pressure 138/62, pulse of 63, respiratory rate 20, satting  96% on room air.  CBGs ranged from 110-132.  Labs on day of discharge,  CBC - white count 5.1, hemoglobin 12.2, platelets 160, hematocrit 35.1,  sodium 140, potassium 4.8, chloride 108, bicarb 25, BUN 10, creatinine  0.83 and a glucose of 109, calcium of 8.9.   It was a pleasure taking care of Mr. Jguadalupe Opiela.      Ramiro Harvest, MD  Electronically Signed     DT/MEDQ  D:  07/29/2007  T:  07/29/2007  Job:  [id number]   cc:   C. Duane Lope, M.D.  Arturo Morton. Riley Kill, MD, [facility]  Marolyn Hammock. Thad Ranger, M.D.  Wilhemina Bonito. Marina Goodell, MD

## 2010-05-30 NOTE — Discharge Summary (Signed)
[place]  Patient:    Nathan Mills, Nathan Mills                     MRN: [id number] Adm. Date:  [id number] Disc. Date: [id number] Attending:  Jetty Duhamel T CC:         Nathan Mills, M.D., Nathan Mills             Nathan Mills, M.D., surgery             Nathan Boer., M.D.             Nathan Mills, M.D.                           Discharge Summary  DATE OF BIRTH:  05/20/1917  DISCHARGE DIAGNOSES:  1. Acute gallbladder stone pancreatitis, cholecystitis, and hepatitis.     A. Status post laparoscopic cholecystectomy, January 25, 2000, by Dr.        Abbey Mills.     B. Endoscopic retrograde cholangiopancreatography with sphincterectomy by        Dr. Marina Mills on January 23, 2000.  2. Dehydration and hypokalemia, resolved.  3. Postoperative fever with negative workup.  4. Acute urinary retention.     A. Nathan patient discharged with Foley catheter.     B. Voiding trials at Dr. Ala Mills office within two days.  5. Coronary artery disease.     A. History of myocardial infarction about 10 years ago.     B. Negative Cardiolite in November 2001.  6. Hypertension.  7. Benign prostatic hypertrophy:  Transurethral resection of prostate x 2.  8. Dyslipidemia.  9. Terminal neuralgia. 10. History of a stroke in September 2001 without neurologic deficits. 11. Transient pancytopenia of unknown significance.     A. WBC 4.5, hemoglobin 10.2, platelets 129.     B. To be followed as an outpatient.  DISCHARGE MEDICATIONS:  1. Lipitor 20 mg p.o. q.d.  2. Proscar 5 mg p.o. q.d.  3. Baby aspirin one tablet p.o. q.d.  4. Trazodone 50 mg q.h.s.  5. Metoprolol 50 mg at noon.  6. Neurontin 600 mg b.i.d.  7. Tegretol 300 mg three tablets q.d.  8. Flomax 0.4 mg p.o. q.d.  DISCHARGE FOLLOWUP/DISPOSITION:  1. Nathan Mills will be followed at Dr. Ala Mills office within two days for     voiding trials and removal of Nathan Foley  catheter.  2. Nathan Mills at Nathan Mills will see Nathan Mills on     Monday, February 02, 2000.  During this follow-up visit, we recommend to     repeat another CBC to follow on Nathan transient pancytopenia.  If persistent     pancytopenia, a hematological consult may be recommended.  3. Dr. Abbey Mills will follow [name] within two weeks from Nathan surgical     standpoint.  CONSULTATIONS:  1. Dr. Marina Mills, GI, [place].  2. Dr. Abbey Mills, surgery.  3. Dr. Wanda Plump, urology.  PROCEDURES:  1. Endoscopic retrograde cholangiopancreatography with sphincterectomy on     January 23, 2000.  2. Laparoscopic cholecystectomy on January 25, 2000.  3. Foley catheter insertion on January 27, 2000.  DISCHARGE LABORATORIES:  UA negative for pyuria or hematuria.  Hemoglobin 10.2, MCV 95, WBC 4.5, platelets of 129.  Sodium 141, potassium 3.8, chloride 114, CO2 25, BUN 13, creatinine 0.9, glucose 83.  _______ 57, _______ 30, alkaline phosphatase 125,  total bilirubin 0.5.  Blood cultures negative x 2 days.  Lipase 45.  Mills COURSE:  Nathan Mills was admitted to [facility] on January 21, 2000 with acute pancreatitis due to gallstones.  Please see Admission H&P by Dr. Jamesetta Geralds for further details regarding Nathan History of Presentation, Physical Exam, and Laboratory Data.  #1 - ACUTE GALLSTONE PANCREATITIS, CHOLECYSTITIS, AND HEPATITIS:  Nathan patient was admitted with complaints of midabdominal pain, nausea, and vomiting.  He had a low-grade fever though no local cytosis.  Nathan amylase was 8105 and lipase 1500.  Nathan LFTs were abnormal with an SGOT of 46 and SGPT of 254.  Nathan alkaline phosphatase and total bilirubin were within Nathan normal limits.  A clinical diagnosis of acute pancreatitis associated with hepatitis was established.  An abdominal ultrasound confirmed gallbladder stones at Nathan gallbladder neck with cinegraphic evidence of cholecystitis.  There was mild biliary and  pancreatic dilation suspicious for obstruction of Nathan common bile duct.  A GI consult was obtained to evaluate a possible common bile duct obstruction.  An ERCP was performed by Dr. Marina Mills on January 23, 2000.  During this procedure, a sphincterectomy was performed without complications.  Nathan common bile duct was dilated at 10 mm.  A stone removal was performed from Nathan distal common bile duct.  Nathan pancreatic duct was opened without evidence of obstruction.  After ERCP, a surgical consult also was obtained due to Nathan multiple gallbladder stones.  In Nathan meantime, Nathan patient received supportive care and empiric antibiotic therapy intravenously.  Nathan patients hepatitis and pancreatic enzymes started to improve steadily.  Dr. Abbey Mills evaluated Nathan patient and recommended a laparoscopic cholecystectomy, and this procedure was performed on January 25, 2000, without complications.  Nathan following day, Nathan patient developed a 101.3 fever.  Blood cultures were obtained.  Nathan chest x-ray was benign.  Nathan urine culture also was negative.  No evidence of complications from Nathan surgical site was noticed.  One of Nathan blood cultures isolated Staphylococcus epidermidis that was considered to be a skin contaminant.  No antibiotic therapy was used status post laparoscopic cholecystectomy.  Nathan patient afterwards afebrile and hemodynamically stable. Nathan patient was able to tolerate a regular diet at Nathan time of discharge without complications.  Nathan Mills required IV fluids and potassium supplementation postop to correct a mild degree of dehydration and hypokalemia.  At Nathan time of discharge no evidence of dehydration or hypokalemia were noticed.  On February 06, 2000, Nathan Foley catheter was removed.  Arrangements for discharge were already in place.  Nathan patient was unable to urinate.  For this reason, urological consult was obtained.  A Foley catheter was placed without complications by Dr. Wanda Plump.   Nathan patient will  be discharged with his Foley catheter Nathan following day with instructions to return to Dr. Ala Mills office for voiding trials two days later.  Flomax was started to help with this problem.  At Nathan time of discharge, Nathan patient was afebrile and hemodynamically stable.  There was no evidence of complications from Nathan surgical standpoint.  Nathan patients fever declined, then resolved by Nathan time of discharge.  CONDITION AT TIME OF DISCHARGE:  Improved. DD:  01/28/00 TD:  01/29/00 Job: [id number]

## 2010-05-30 NOTE — Consult Note (Signed)
NAME:  Nathan Mills, Nathan Mills NO.:  [id number]   MEDICAL RECORD NO.:  [id number]          PATIENT TYPE:  INP   LOCATION:  3740                         FACILITY:  [facility]   PHYSICIAN:  Melvyn Novas, M.D.  DATE OF BIRTH:  04-05-1917   DATE OF CONSULTATION:  DATE OF DISCHARGE:                                   CONSULTATION   DATE OF CONSULTATION:  December 16, 2003.   HISTORY OF PRESENT ILLNESS:  Nathan Mills, whose birthday is 1917/06/08, is  a patient of Nathan Mills at [facility] and has suffered MIs  in the past.  He was this time admitted on December 13, 2003, after having  suffered a syncopal spell for which there was no other explanation found as  a possible transient arrhythmia.  The 77 year old Caucasian, right-handed  gentleman is well informed and tells me that he had a cardiac telemetry  since admission here at [facility] and so far no abnormality was  shown.  He had undergone an echocardiogram, which showed, according to its  interpreter, severe hypokinesis of the posterior and inferior wall, and an  ejection fraction with a mild reduction at 50%.  The patient states that  since his syncope spell he has returned to normal.  He is not found to have  any decreased exercise tolerance, and he also makes a point of stating that  he has no persistent neurologic deficits or findings.  An MRI was positive  for a high located, right, frontoparietal, ischemic stroke without  hemorrhagic conversion.  The stroke's size is rather smallish and its  location favors a small vessel origin.  The patient was on Plavix as the  stroke occurred.  He states that in the past he took four white aspirin and  Plavix but was asked to discontinue the aspirin.  He is now concerned that  he needs a stronger anticoagulant.   FAMILY HISTORY:  Coronary artery disease positive.   SOCIAL HISTORY:  The patient lives with his wife, is retired.   MEDICATIONS:  See Dr.  Odessa Fleming H&P from day of admission.   PAST MEDICAL HISTORY:  1.  Positive for MI.  2.  Positive for coronary artery disease.  3.  Positive for hypertension.  4.  Positive for major depressive episodes followed by ECT in the past.  5.  Positive for dyslipidemia.  6.  Positive for dyspnea.   NEUROLOGIC EXAMINATION:  Mental status:  The patient's affect is oriented,  fluent in speech, repetition, and naming.  He shows no sign of apraxia or  fovea.  He does have slight loss of hearing.  Cranial nerve examination  shows equal pupils reactive to light and accommodation bilaterally to  simultaneous stimuli, preserved visual fields, and full extraocular  movements without nystagmus except at end point.  No facial sensory loss.  Tongue and uvula are midline.  Motor exam shows 5 out of 5 equal grip  strength and tone.  No cogwheeling, no ataxia, no dysmetria, no tremor.  The  patient also shows no pronator drift.  Deep tendon reflexes were equal.  The  patient has no Babinski response, and the whole neurologic evaluation so far  is nonfocal.  I cannot detect a carotid bruit, but the carotid Doppler  suggested on the left, a stenosis at approximately 60%.   ASSESSMENT:  Small stroke, right frontoparietal, acute, within the three-day  time frame.  Patient was on Plavix as his stroke occurred, so a change to  another antiplatelet agent such as Aggrenox might be indicated or to add  aspirin to Plavix since the patient is a coronary artery disease patient.  The patient had told me that his cardiologist plans a CT of the heart.  This  is presumably to rule out embolic source.  I would add a transcranial  Doppler to evaluate for proximal stenosis of the right middle cerebral  artery (MCA), and again, most likely the patient will tolerate best to be  put back on aspirin in addition to Plavix.  I will discuss the case on  Monday morning with Dr. Pearlean Brownie.       CD/MEDQ  D:  12/15/2003  T:  12/16/2003   Job:  [id number]   cc:   C. Duane Lope, M.D.  [address]  [place]  [place] [address]  Fax: [phone]   Duke Salvia, M.D.   Arturo Morton. Riley Kill, M.D. [facility]

## 2010-05-30 NOTE — Discharge Summary (Signed)
NAME:  Nathan Mills, Nathan Mills NO.:  [id number]   MEDICAL RECORD NO.:  [id number]          PATIENT TYPE:  OIB   LOCATION:  2899                         FACILITY:  [facility]   PHYSICIAN:  Maple Mirza, PA   DATE OF BIRTH:  1917/05/17   DATE OF ADMISSION:  03/01/2006  DATE OF DISCHARGE:                               DISCHARGE SUMMARY   ADDENDUM   This dictation took greater than 30 minutes.      Maple Mirza, PA     GM/MEDQ  D:  03/01/2006  T:  03/02/2006  Job:  [id number]

## 2010-05-30 NOTE — Op Note (Signed)
NAME:  HUDSYN, CHAMPINE NO.:  [id number]   MEDICAL RECORD NO.:  [id number]          PATIENT TYPE:  OIB   LOCATION:  2899                         FACILITY:  [facility]   PHYSICIAN:  Doylene Canning. Ladona Ridgel, MD    DATE OF BIRTH:  26-Jun-1917   DATE OF PROCEDURE:  03/01/2006  DATE OF DISCHARGE:                               OPERATIVE REPORT   PROCEDURE PERFORMED:  Implantation of an implantable loop recorder.   INDICATIONS:  Unexplained syncope.   I. INTRODUCTION:  The patient is a very pleasant elderly male with a  history of coronary disease, status post MI, with mild LV dysfunction  and an EF of 40% to 50%.  The patient had an unexplained syncopal  episode with no obvious etiology, breaking his thumb when he fell down.  He is now referred for insertion of implantable loop recorder.   II. PROCEDURE:  After informed consent was obtained, the patient was  taken to the diagnostic EP lab in the fasting state.  After the usual  preparation and draping, intravenous fentanyl and midazolam were given  for sedation.  Thirty milliliters of lidocaine were infiltrated into the  left infraclavicular region.  A 3-cm incision was carried out over this  region and electrocautery utilized to dissect down to the fascial plane.  A subcutaneous pocket was made with electrocautery.  Electrocautery was  utilized to assure hemostasis.  Kanamycin irrigation was utilized to  irrigate the pocket.  The Medtronic Reveal Plus model 9526 implantable  loop recorder, serial number [id number] was placed in the subcutaneous  pocket and secured with a silk suture.  Kanamycin irrigation was  utilized to irrigate the pocket and the incision was then closed with a  layer of 2-0 Vicryl followed by layer of 3-0 Vicryl, followed by layer  of 4-0 Vicryl.  Benzoin was painted on the skin, Steri-Strips were  applied and a pressure dressing was placed and the patient was returned  to his room in satisfactory  condition.   III. COMPLICATIONS:  There were no immediate procedural complications.   IV. RESULTS:  This demonstrated a successful implantation of a Medtronic  implantable loop recorder in a patient with recurrent unexplained  syncope.      Doylene Canning. Ladona Ridgel, MD  Electronically Signed     GWT/MEDQ  D:  03/01/2006  T:  03/02/2006  Job:  [id number]

## 2010-05-30 NOTE — Op Note (Signed)
[name]  Patient:    Nathan Mills, Nathan Mills                     MRN: [id number] Proc. Date: 01/25/00 Adm. Date:  [id number] Attending:  Jamesetta Geralds CC:         Jamesetta Geralds, M.D.  Venita Lick. Pleas Koch., M.D. [facility]   Operative Report  PREOPERATIVE DIAGNOSIS:  Biliary pancreatitis.  POSTOPERATIVE DIAGNOSIS:  Biliary pancreatitis.  OPERATION PERFORMED:  Laparoscopic cholecystectomy.  SURGEON:  Adolph Pollack, M.D.  ASSISTANT:  Anselm Pancoast. Zachery Dakins, M.D.  ANESTHESIA:  General.  INDICATIONS FOR PROCEDURE:  This is an 77 year old male who presented on January 21, 2000 with acute epigastric pain and was found to have increasing liver function tests, amylase and lipase.  Ultrasound demonstrated gallstones and mild dilatation of the common bile duct.  He underwent preoperative ERCP. His symptoms have improved as well as his amylase, lipase, liver function tests.  He is now brought to the operating room.  The procedure and risks have been explained to him and his wife preoperatively.  DESCRIPTION OF PROCEDURE:  He was placed supine on the operating table and general anesthetic was administered.  The abdomen was sterilely prepped and draped.  Local anesthetic was infiltrated in the infraumbilical region and a small infraumbilical incision was made.  The subcutaneous tissues were dissected bluntly and a 1.5 cm incision made in the fascia. The peritoneal cavity was entered bluntly and under direct vision.  A pursestring suture of 0 Vicryl was placed around the fascial edges.  The Hasson trocar was introduced into the peritoneal cavity and pneumoperitoneum was created by insufflation of CO2 gas.  Next, the patient was placed in the appropriate position.  An 11 mm trocar was placed through an epigastric incision into the peritoneal cavity under direction and two 5 mm trocars placed in the right abdomen under direct vision into the peritoneal cavity.  The  fundus of the gallbladder was grasped. Omental adhesions were taken down.  The infundibulum was grasped and retracted laterally.  Using blunt dissection to take down to the gallbladder, I identified the neck of the gallbladder and the cystic duct.  I isolated this. I did mobilize the infundibulum.  I subsequently also isolated the cystic artery.  I clipped the gallbladder and the cystic duct at the gallbladder neck.  I then made an incision in the cystic duct and could not milk any stones back.  I subsequently placed three clips on it but none of the clips seemed to operate properly.  I placed two more clips on which seemed to work better and divided it.  I then placed a chromic Endoloop across the cystic duct.  Next the cystic artery was identified, clipped and divided.  The gallbladder was then dissected free from the liver bed using the cautery.  Bleeding points were controlled with the cautery.  There was one raw surface of the liver bed and I placed Surgicel on this.  I irrigated the perihepatic area.  I did not note any bile leakage or bleeding.  I then removed the gallbladder through the subumbilical ports.  The gallbladder fossa was once again inspected and no bile leak or bleeding was noted.  The irrigation fluid was evacuated.  The trocars were removed and the pneumoperitoneum was released.  The subumbilical fascial defect was closed by tightening up and tying down the pursestring suture.  The skin incisions were then closed with 4-0 Monocryl subcuticular  stitches followed by Steri-Strips and sterile dressings.  The patient tolerated the procedure well without any apparently complications and was taken to the recovery room in satisfactory condition. DD:  01/25/00 TD:  01/25/00 Job: [id number]

## 2010-05-30 NOTE — Assessment & Plan Note (Signed)
[facility]                            CARDIOLOGY OFFICE NOTE   NAME:Nathan Mills, Nathan Mills                     MRN:          [id number]  DATE:04/05/2006                            DOB:          10/17/1917    Mr. Steinberger is in for followup. In general, he is much better. He feels  well. He denies any symptoms whatsoever at this point in time. He  elected not to have a radionuclide imaging study. He was seen in the  emergency room with nausea and abdominal discomfort, weakness. He is now  much improved. He really does deny shortness of breath. Interestingly  enough, by echo, his EF is in the 40-45% range. His event monitor shows  no obvious abnormality. His BNP is elevated in the mid-400 range,  although he does not have symptoms on examination or historically of  worsening heart failure.   MEDICATIONS:  1. Prilosec 20 mg daily.  2. Trazodone 50 mg q nightly.  3. Plavix 75 mg daily.  4. Xanax 0.5 mg q nightly.  5. Lexapro 10 daily.  6. Proscar 5 daily.  7. Aspirin 81 mg daily.  8. Toprol XL 50 mg one-half tablet daily.  9. Hydrochlorothiazide 25 mg one-half tablet daily.  10.Caduet 50/20 daily.  11.Potassium liquid.   PHYSICAL EXAMINATION:  He is alert and oriented.  Lung fields actually are quite clear.  Blood pressure is 148/72 and pulse is 75.  EXTREMITIES: Reveal no edema.  Jugular veins are not significantly distended at the current time.   The patient has prior inferior infarction. He has been able to walk  around [facility] four times a day without any major limitation. I  am concerned his elevated BNP, but I do not have a definite answer as to  why this is elevated. We will continue to follow him closely. I wanted  to see him back in about four weeks, but he wants to extend that out  farther.     Arturo Morton. Riley Kill, MD, [facility]  Electronically Signed    TDS/MedQ  DD: 04/05/2006  DT: 04/05/2006  Job #: [id number]

## 2010-05-30 NOTE — Assessment & Plan Note (Signed)
[facility]                         ELECTROPHYSIOLOGY OFFICE NOTE   NAME:[name]                     MRN:          [id number]  DATE:02/22/2006                            DOB:          December 07, 1917    Nathan Mills is referred today by Dr. Bonnee Quin for evaluation of  unexplained syncope.  The patient is a very pleasant elderly man with a  history of coronary artery disease, status post MI in the past, status  post angioplasty and stenting with mild LV dysfunction and EF of 40%.  His initial episode of syncope occurred back in December 2005.  At that  time, he was admitted to the hospital and was kept several days but no  clear cut etiology of the syncope was determined.  The patient did well  until several weeks ago when he developed a recurrent episode of  syncope.  [place] as he was coming inside passed out.  He hit the floor and  bruised his face and arms and broke his thumb.  He denied palpitations  prior to the event and denied chest pain or shortness of breath.  He  denies peripheral edema.  Overall his health is fairly good.  He does  have some chronic aches and pains but had no real specific complaints  today.   MEDICATIONS:  1. Prilosec.  2. Trazodone.  3. Plavix.  4. Xanax.  5. Proscar.  6. Low dose aspirin.  7. Toprol at 25 daily.  8. Potassium.  9. HCTZ.  10.Caduet.   FAMILY HISTORY:  Noncontributory at his advanced age.   SOCIAL HISTORY:  The patient is married.  He denies tobacco or ethanol  abuse.  He lives with his wife.   ADDITIONAL PAST MEDICAL HISTORY:  1. History of hemorrhoidectomy.  2. History of kidney stones.  3. History of remote stroke.  4. History of cholecystectomy.   REVIEW OF SYSTEMS:  Negative except as needed in the HPI.  He  specifically denies chest pain, shortness of breath, or palpitations.   PHYSICAL EXAMINATION:  GENERAL:  He  is a pleasant elderly appearing man  in no acute distress.  VITAL SIGNS:  The blood pressure is 108/60, the pulse 68 and regular,  respirations were 18, the weight was 149 pounds.  HEENT:  Normocephalic and atraumatic.  Pupils equal and round.  The  oropharynx is moist.  The sclerae are anicteric.  NECK:  Revealed no jugular venous distention.  There is no thyromegaly.  The trachea is midline.  The carotids are 2+ and symmetric.  LUNGS:  Clear bilateral auscultation.  There are no wheezes, rales, or  rhonchi.  No increased work-of-breathing was noted.  CARDIOVASCULAR:  Revealed a regular rate and rhythm with normal S1 and  S2.  The PMI was enlarged and laterally displaced.  ABDOMEN:  Soft,  nontender, nondistended.  There is no organomegaly.  The bowel sounds  are present.  No rebound or guarding.  EXTREMITIES:  Demonstrated no cyanosis, clubbing, or edema.  The pulses  were 2+  and symmetric.  NEUROLOGIC:  Alert and oriented x3 with cranial nerves intact.  Strength  was 5/5 and symmetric.   The EKG demonstrates a sinus rhythm with PACs and a prior inferior MI.   IMPRESSION:  1. Recurrent syncope.  2. Hypertension.  3. Coronary disease, status post myocardial infarction.   DISCUSSION:  Mr. Friedel mild LV dysfunction post MI is always  worrisome that he might have had a ventricular arrhythmia.  However, his  advanced age also predisposes him to brady arrhythmias as the etiology  of his syncope.  His LV function is mildly to moderately impaired with  an EF of 40-50% by echo.  He is 77 years old.  In light of all the  above, I have recommended that we proceed with insertion of an  implantable loop recorder.  Hopefully, he will have a recurrent episode  of syncope or near syncope where we can document bradycardia as the  cause and place a pacemaker.  This, however, may not be the etiology and  for this reason a loop recorder will be required.     Doylene Canning. Ladona Ridgel, MD   Electronically Signed    GWT/MedQ  DD: 02/22/2006  DT: 02/22/2006  Job #: [id number]   cc:   Arturo Morton. Riley Kill, MD, [facility]

## 2010-05-30 NOTE — Assessment & Plan Note (Signed)
[facility]                            CARDIOLOGY OFFICE NOTE   NAME:Aceituno, KUPER RENNELS                     MRN:          [id number]  DATE:05/10/2006                            DOB:          24-Jul-1917    Mr. Canner is in for a follow-up visit.  In general, he really continues  to do well.  He has had an elevated BNP and some reduction in his  overall left ventricular function, although he has continued to do  reasonably well.  He is fishing actively as well as walking along  [employer].  He denies any prior chest pain.   CURRENT MEDICATIONS:  1. Prilosec 20 mg daily.  2. Trazodone 50 mg nightly.  3. Plavix 75 mg daily.  4. Xanax 0.5 mg q.p.m.  5. Lexapro 10 mg daily.  6. Proscar 5 mg daily.  7. Aspirin 81 mg daily.  8. Toprol XL 50 mg 1/2 tablet daily.  9. Hydrochlorothiazide 25 mg 1/2 tablet daily.  10.Caduet 5/20 1 daily.  11.Potassium supplements.  Recent potassium was unremarkable.   PHYSICAL EXAMINATION:  GENERAL:  He is alert and oriented.  He seems  jovial.  He is in no acute distress.  VITAL SIGNS:  Blood pressure 154/66, pulse 70.  LUNGS:  Lung fields are entirely clear.  CARDIAC:  Rhythm is regular.   IMPRESSION:  1. Coronary artery disease with prior myocardial infarction/old      infarction.  2. Episode of unexplained syncope with loop recorder in place.  3. Moderate bradycardia.  4. History of kidney stones.  5. Remote stroke.  6. Cholecystectomy.   PLAN:  1. Continue current medical regimen.  2. Follow up in the clinic in two months and with Dr. Ladona Ridgel next      month in the EP clinic.   ADDENDUM:  He knows not to drive.     Arturo Morton. Riley Kill, MD, [facility]  Electronically Signed    TDS/MedQ  DD: 05/10/2006  DT: 05/11/2006  Job #: [id number]

## 2010-08-26 ENCOUNTER — Other Ambulatory Visit: Payer: Self-pay | Admitting: Cardiology

## 2010-08-26 NOTE — Telephone Encounter (Signed)
Per pt call. Pt needs RX refill, pt said he hasn't had RX since Saturday.

## 2010-08-27 MED ORDER — AMLODIPINE-ATORVASTATIN 5-20 MG PO TABS: 1.0000 | ORAL_TABLET | Freq: Every day | ORAL | Status: DC

## 2010-10-06 ENCOUNTER — Emergency Department (HOSPITAL_COMMUNITY): Payer: Medicare Other

## 2010-10-06 ENCOUNTER — Inpatient Hospital Stay (HOSPITAL_COMMUNITY)
Admission: EM | Admit: 2010-10-06 | Discharge: 2010-10-11 | DRG: 178 | Disposition: A | Discharge status: Home Health | Payer: Medicare Other | Attending: Internal Medicine | Admitting: Internal Medicine

## 2010-10-06 DIAGNOSIS — R1310 Dysphagia, unspecified: Secondary | ICD-10-CM | POA: Diagnosis present

## 2010-10-06 DIAGNOSIS — F3289 Other specified depressive episodes: Secondary | ICD-10-CM | POA: Diagnosis present

## 2010-10-06 DIAGNOSIS — Z79899 Other long term (current) drug therapy: Secondary | ICD-10-CM

## 2010-10-06 DIAGNOSIS — K449 Diaphragmatic hernia without obstruction or gangrene: Secondary | ICD-10-CM | POA: Diagnosis present

## 2010-10-06 DIAGNOSIS — G500 Trigeminal neuralgia: Secondary | ICD-10-CM | POA: Diagnosis present

## 2010-10-06 DIAGNOSIS — Z7902 Long term (current) use of antithrombotics/antiplatelets: Secondary | ICD-10-CM

## 2010-10-06 DIAGNOSIS — R634 Abnormal weight loss: Secondary | ICD-10-CM | POA: Diagnosis present

## 2010-10-06 DIAGNOSIS — J4489 Other specified chronic obstructive pulmonary disease: Secondary | ICD-10-CM | POA: Diagnosis present

## 2010-10-06 DIAGNOSIS — Z87442 Personal history of urinary calculi: Secondary | ICD-10-CM

## 2010-10-06 DIAGNOSIS — Y929 Unspecified place or not applicable: Secondary | ICD-10-CM

## 2010-10-06 DIAGNOSIS — R197 Diarrhea, unspecified: Secondary | ICD-10-CM | POA: Diagnosis present

## 2010-10-06 DIAGNOSIS — IMO0002 Reserved for concepts with insufficient information to code with codable children: Secondary | ICD-10-CM | POA: Diagnosis present

## 2010-10-06 DIAGNOSIS — Z8673 Personal history of transient ischemic attack (TIA), and cerebral infarction without residual deficits: Secondary | ICD-10-CM

## 2010-10-06 DIAGNOSIS — F4310 Post-traumatic stress disorder, unspecified: Secondary | ICD-10-CM | POA: Diagnosis present

## 2010-10-06 DIAGNOSIS — I252 Old myocardial infarction: Secondary | ICD-10-CM

## 2010-10-06 DIAGNOSIS — N400 Enlarged prostate without lower urinary tract symptoms: Secondary | ICD-10-CM | POA: Diagnosis present

## 2010-10-06 DIAGNOSIS — E785 Hyperlipidemia, unspecified: Secondary | ICD-10-CM | POA: Diagnosis present

## 2010-10-06 DIAGNOSIS — E860 Dehydration: Secondary | ICD-10-CM | POA: Diagnosis present

## 2010-10-06 DIAGNOSIS — J449 Chronic obstructive pulmonary disease, unspecified: Secondary | ICD-10-CM | POA: Diagnosis present

## 2010-10-06 DIAGNOSIS — Z681 Body mass index (BMI) 19 or less, adult: Secondary | ICD-10-CM

## 2010-10-06 DIAGNOSIS — E876 Hypokalemia: Secondary | ICD-10-CM | POA: Diagnosis not present

## 2010-10-06 DIAGNOSIS — Z87440 Personal history of urinary (tract) infections: Secondary | ICD-10-CM

## 2010-10-06 DIAGNOSIS — K219 Gastro-esophageal reflux disease without esophagitis: Secondary | ICD-10-CM | POA: Diagnosis present

## 2010-10-06 DIAGNOSIS — K225 Diverticulum of esophagus, acquired: Secondary | ICD-10-CM | POA: Diagnosis present

## 2010-10-06 DIAGNOSIS — J690 Pneumonitis due to inhalation of food and vomit: Principal | ICD-10-CM | POA: Diagnosis present

## 2010-10-06 DIAGNOSIS — W1809XA Striking against other object with subsequent fall, initial encounter: Secondary | ICD-10-CM | POA: Diagnosis present

## 2010-10-06 DIAGNOSIS — F329 Major depressive disorder, single episode, unspecified: Secondary | ICD-10-CM | POA: Diagnosis present

## 2010-10-06 DIAGNOSIS — Y998 Other external cause status: Secondary | ICD-10-CM

## 2010-10-06 DIAGNOSIS — I2510 Atherosclerotic heart disease of native coronary artery without angina pectoris: Secondary | ICD-10-CM | POA: Diagnosis present

## 2010-10-06 LAB — COMPREHENSIVE METABOLIC PANEL
ALT: 11 U/L (ref 0–53)
AST: 15 U/L (ref 0–37)
Albumin: 3.1 g/dL — ABNORMAL LOW (ref 3.5–5.2)
Alkaline Phosphatase: 76 U/L (ref 39–117)
Glucose, Bld: 156 mg/dL — ABNORMAL HIGH (ref 70–99)
Potassium: 4.2 mEq/L (ref 3.5–5.1)
Sodium: 139 mEq/L (ref 135–145)
Total Protein: 6.8 g/dL (ref 6.0–8.3)

## 2010-10-06 LAB — DIFFERENTIAL
Basophils Absolute: 0 10*3/uL (ref 0.0–0.1)
Basophils Relative: 0 % (ref 0–1)
Monocytes Absolute: 0.3 10*3/uL (ref 0.1–1.0)
Neutro Abs: 4.2 10*3/uL (ref 1.7–7.7)

## 2010-10-06 LAB — CBC
Hemoglobin: 10.7 g/dL — ABNORMAL LOW (ref 13.0–17.0)
MCHC: 33.8 g/dL (ref 30.0–36.0)
RDW: 12.5 % (ref 11.5–15.5)

## 2010-10-06 LAB — TROPONIN I: Troponin I: <0.3 ng/mL (ref <0.30)

## 2010-10-07 ENCOUNTER — Inpatient Hospital Stay (HOSPITAL_COMMUNITY): Payer: Medicare Other

## 2010-10-07 LAB — CBC
HCT: 31.9 % — ABNORMAL LOW (ref 39.0–52.0)
MCV: 96.7 fL (ref 78.0–100.0)
Platelets: 213 10*3/uL (ref 150–400)
RBC: 3.3 MIL/uL — ABNORMAL LOW (ref 4.22–5.81)
WBC: 7.2 10*3/uL (ref 4.0–10.5)

## 2010-10-07 LAB — DIFFERENTIAL
Lymphocytes Relative: 20 % (ref 12–46)
Lymphs Abs: 1.5 10*3/uL (ref 0.7–4.0)
Neutrophils Relative %: 68 % (ref 43–77)

## 2010-10-08 LAB — DIFFERENTIAL
Basophils Absolute: 0 10*3/uL (ref 0.0–0.1)
Basophils Relative: 0 % (ref 0–1)
Eosinophils Relative: 3 % (ref 0–5)
Lymphocytes Relative: 26 % (ref 12–46)
Neutro Abs: 4.4 10*3/uL (ref 1.7–7.7)

## 2010-10-08 LAB — COMPREHENSIVE METABOLIC PANEL
ALT: 7 U/L (ref 0–53)
AST: 12 U/L (ref 0–37)
Albumin: 2.8 g/dL — ABNORMAL LOW (ref 3.5–5.2)
Alkaline Phosphatase: 73 U/L (ref 39–117)
BUN: 15 mg/dL (ref 6–23)
Chloride: 105 mEq/L (ref 96–112)
Potassium: 3.8 mEq/L (ref 3.5–5.1)
Sodium: 138 mEq/L (ref 135–145)
Total Bilirubin: 0.4 mg/dL (ref 0.3–1.2)

## 2010-10-08 LAB — CBC
HCT: 30.8 % — ABNORMAL LOW (ref 39.0–52.0)
Platelets: 212 10*3/uL (ref 150–400)
RDW: 12.5 % (ref 11.5–15.5)
WBC: 6.9 10*3/uL (ref 4.0–10.5)

## 2010-10-09 ENCOUNTER — Inpatient Hospital Stay (HOSPITAL_COMMUNITY): Payer: Medicare Other

## 2010-10-09 LAB — DIFFERENTIAL
Basophils Absolute: 0
Basophils Absolute: 0
Basophils Absolute: 0
Basophils Relative: 1
Eosinophils Absolute: 0.3 10*3/uL (ref 0.0–0.7)
Eosinophils Absolute: 0.5
Eosinophils Absolute: 0.6
Eosinophils Relative: 4 % (ref 0–5)
Eosinophils Relative: 11 — ABNORMAL HIGH
Eosinophils Relative: 2
Lymphocytes Relative: 24 % (ref 12–46)
Lymphocytes Relative: 3 — ABNORMAL LOW
Lymphs Abs: 1.8 10*3/uL (ref 0.7–4.0)
Lymphs Abs: 0.4 — ABNORMAL LOW
Monocytes Absolute: 0.7 10*3/uL (ref 0.1–1.0)
Monocytes Absolute: 0.4
Monocytes Absolute: 0.4
Monocytes Relative: 9 % (ref 3–12)
Monocytes Relative: 3
Monocytes Relative: 7
Neutro Abs: 10.8 — ABNORMAL HIGH
Neutro Abs: 2.8
Neutrophils Relative %: 64

## 2010-10-09 LAB — HEMOGLOBIN A1C

## 2010-10-09 LAB — CBC
HCT: 31.4 % — ABNORMAL LOW (ref 39.0–52.0)
HCT: 32 — ABNORMAL LOW
HCT: 34.4 — ABNORMAL LOW
HCT: 35.1 — ABNORMAL LOW
HCT: 35.4 — ABNORMAL LOW
Hemoglobin: 11.1 — ABNORMAL LOW
Hemoglobin: 12 — ABNORMAL LOW
Hemoglobin: 12 — ABNORMAL LOW
MCH: 32.3 pg (ref 26.0–34.0)
MCHC: 34.5
MCHC: 34.5
MCHC: 34.7
MCV: 96.6 fL (ref 78.0–100.0)
MCV: 95.5
MCV: 95.6
MCV: 96
MCV: 96.1
Platelets: 225 10*3/uL (ref 150–400)
Platelets: 121 — ABNORMAL LOW
Platelets: 121 — ABNORMAL LOW
Platelets: 151
Platelets: 160
RBC: 3.23 — ABNORMAL LOW
RBC: 3.6 — ABNORMAL LOW
RBC: 3.67 — ABNORMAL LOW
RDW: 12.9 % (ref 11.5–15.5)
RDW: 12.7
WBC: 4.3
WBC: 5.1

## 2010-10-09 LAB — URINE MICROSCOPIC-ADD ON

## 2010-10-09 LAB — BASIC METABOLIC PANEL
BUN: 10
BUN: 11
BUN: 11
BUN: 8
CO2: 25
CO2: 27
Calcium: 8.1 — ABNORMAL LOW
Calcium: 8.3 — ABNORMAL LOW
Calcium: 8.4
Chloride: 107
Chloride: 108
Chloride: 109
Creatinine, Ser: 0.83
Creatinine, Ser: 0.83
Creatinine, Ser: 0.87
GFR calc Af Amer: >60
GFR calc Af Amer: >60
GFR calc Af Amer: >60
GFR calc non Af Amer: >60
GFR calc non Af Amer: >60
GFR calc non Af Amer: >60
Glucose, Bld: 554
Potassium: 3.3 — ABNORMAL LOW
Potassium: 3.6
Potassium: 4.2
Potassium: 4.8
Sodium: 132 — ABNORMAL LOW
Sodium: 141

## 2010-10-09 LAB — TSH: TSH: 0.417

## 2010-10-09 LAB — COMPREHENSIVE METABOLIC PANEL
AST: 14 U/L (ref 0–37)
Albumin: 2.8 g/dL — ABNORMAL LOW (ref 3.5–5.2)
BUN: 17 mg/dL (ref 6–23)
Calcium: 8.7 mg/dL (ref 8.4–10.5)
Creatinine, Ser: 0.75 mg/dL (ref 0.50–1.35)
GFR calc non Af Amer: >60 mL/min (ref >60)
Total Bilirubin: 0.3 mg/dL (ref 0.3–1.2)

## 2010-10-09 LAB — URINALYSIS, ROUTINE W REFLEX MICROSCOPIC
Bilirubin Urine: NEGATIVE
Glucose, UA: NEGATIVE
Hgb urine dipstick: NEGATIVE
Ketones, ur: NEGATIVE
Nitrite: NEGATIVE
Specific Gravity, Urine: 1.019
pH: 8

## 2010-10-09 LAB — KETONES, QUALITATIVE: Acetone, Bld: NEGATIVE

## 2010-10-09 LAB — CARDIAC PANEL(CRET KIN+CKTOT+MB+TROPI)
CK, MB: 1
Relative Index: INVALID
Total CK: 41
Troponin I: 0.02
Troponin I: 0.06

## 2010-10-09 LAB — HEPATIC FUNCTION PANEL
ALT: 9
Albumin: 3 — ABNORMAL LOW
Indirect Bilirubin: 0.5
Total Protein: 5.3 — ABNORMAL LOW

## 2010-10-09 LAB — GLYCOHEMOGLOBIN, TOTAL: Hemoglobin-A1c: 6.8 % (ref <9.0)

## 2010-10-09 LAB — LIPID PANEL: VLDL: 13

## 2010-10-09 LAB — URINE CULTURE

## 2010-10-09 LAB — CULTURE, RESPIRATORY W GRAM STAIN

## 2010-10-09 LAB — VIRUS CULTURE

## 2010-10-09 LAB — EXPECTORATED SPUTUM ASSESSMENT W GRAM STAIN, RFLX TO RESP C

## 2010-10-09 LAB — CK TOTAL AND CKMB (NOT AT ARMC): CK, MB: 0.7

## 2010-10-09 LAB — POCT I-STAT, CHEM 8
BUN: 19
Creatinine, Ser: 1.2
Hemoglobin: 11.9 — ABNORMAL LOW
Potassium: 3.8
Sodium: 140
TCO2: 26

## 2010-10-09 LAB — TROPONIN I: Troponin I: 0.02

## 2010-10-09 LAB — MAGNESIUM: Magnesium: 1.9 mg/dL (ref 1.5–2.5)

## 2010-10-09 LAB — HOMOCYSTEINE: Homocysteine: 8.1

## 2010-10-10 LAB — BASIC METABOLIC PANEL
CO2: 22 mEq/L (ref 19–32)
Calcium: 8.9 mg/dL (ref 8.4–10.5)
Creatinine, Ser: 0.78 mg/dL (ref 0.50–1.35)
GFR calc non Af Amer: >60 mL/min (ref >60)
Glucose, Bld: 106 mg/dL — ABNORMAL HIGH (ref 70–99)

## 2010-10-21 NOTE — Discharge Summary (Signed)
NAME:  Nathan Mills, Nathan Mills NO.:  [id number]  MEDICAL RECORD NO.:  [id number]  LOCATION:  1304                         FACILITY:  [facility]  PHYSICIAN:  Marinda Elk, M.D.DATE OF BIRTH:  Mar 24, 1917  DATE OF ADMISSION:  10/06/2010 DATE OF DISCHARGE:                              DISCHARGE SUMMARY   PRIMARY CARE DOCTOR:  Hessie Diener (C.Alan) Tenny Craw, M.D.  DISCHARGE DIAGNOSES: 1. Pneumonia, most likely aspiration pneumonia secondary to Zenker     diverticulum. 2. Chronic obstructive pulmonary disease. 3. History of transient ischemic attack. 4. Hypokalemia. 5. Diarrhea.  DISCHARGE MEDICATIONS: 1. Avelox 400 mg p.o. daily. 2. Caduet 5/20 mg daily. 3. Garlic-X, take 1 tablet daily. 4. Lexapro 10 mg daily. 5. Plavix 75 mg daily. 6. Prilosec 10 mg daily. 7. Proscar 5 mg daily. 8. Toprol 50 mg daily. 9. Tylenol 2 tablets at bedtime as needed. 10.Vitamin B12 tablet daily. 11.Vitamin D3 over-the-counter tablet daily. 12.Xanax 0.5 mg for sleep daily.  PROCEDURES PERFORMED: 1. Chest x-ray showed grossly unchanged heterogeneous opacity within     the right middle and lower lung, worrisome for infection or     aspiration.  Followup radiograph in 4 to 6 weeks recommended for     resolution. 2. CT chest that showed confluent trinodular airspace opacity, most     prominent in the right lower lobe with appearance of atypical     pneumonia. 3. Left hip x-ray, no acute fractures. 4. Chest x-ray on October 06, 2010, showed increasing density in the     right lower lobe, finding concerning for pneumonia, aspiration,     hyperinflation, and suspected emphysematous changes.  BRIEF ADMITTING HISTORY AND PHYSICAL:  This is a 77 year old male sent from the office of primary care, Dr. Duane Lope because of productive cough over the last 10 days, associated with p.o. intake, malaise, 9- pound weight loss.  The patient also showed a pulse oximetry of 93% on room air.  Therefore, he  was referred to the ED.  Here in the ED, chest x-ray was done with the results above.  In addition, he had hyperinflation.  A followup CT was recommended that showed results above.  He has also been complaining of yellow sputum with cough and congestion for the last 10 days, for which he sought medical attention this morning.  So, we were asked to admit and further evaluate.  Labs on admission showed white count 6.3, hemoglobin of 10, platelet count of 212.  Sodium 139, potassium 4.2, chloride 104, bicarbonate of 26, glucose of 156, BUN of 24, creatinine 0.7, calcium of 8.8.  Total protein of 6.8, albumin of 3.1, AST 15.  Troponin is negative.  Chest x- ray as above.  EKG had normal sinus rhythm.  BRIEF HOSPITAL COURSE: 1. Aspiration pneumonia.  He was started on vancomycin and Zosyn on     admission.  Then, he was switched to Avelox.  Swallowing evaluation     was done, that showed severe dysphagia secondary to the patient's     Zenker diverticulum, resulting in aspiration of thing and trace     aspiration of nectar and severe __________of material.  So, he will  continue his Avelox for 3 more days and he was discharged in stable     condition. Patient was not a candidate for surgery as per his      cardiologist the patient relates, he will follow up as an outpatient      with his cardiologist. 2. COPD, currently stable.  No changes were made. 3. History of TIA, currently stable.  No changes were made. 4. Diarrhea.  C difficile was checked as he came with diarrhea on     admission.  This resolved.  C difficile was negative.  He was     discharged in stable condition.  PHYSICAL EXAMINATION:  VITAL SIGNS:  Temperature 98, pulse 72, respirations 14, blood pressure 152/61, he was saturating 95% on room air.  LABS:  Labs done on day of discharge, none.  DISPOSITION:  The patient will follow up with his primary care doctor in 2 to 4 weeks.  We will see here how he is doing.  We will  check his blood pressure and titrate blood pressure medications as needed.     Marinda Elk, M.D.     AF/MEDQ  D:  10/11/2010  T:  10/11/2010  Job:  [id number]  cc:   Magnus Sinning) Tenny Craw, M.D. Fax: [phone]  Electronically Signed by Marinda Elk M.D. on 10/21/2010 08:34:54 AM

## 2010-10-24 ENCOUNTER — Telehealth: Payer: Self-pay | Admitting: Cardiology

## 2010-10-24 NOTE — Telephone Encounter (Signed)
Refill needed generic toprol 50mg , 90 days, with refills, uses [facility]

## 2010-10-27 MED ORDER — METOPROLOL SUCCINATE ER 50 MG PO TB24: 25.0000 mg | ORAL_TABLET | Freq: Every day | ORAL | Status: DC

## 2010-11-07 ENCOUNTER — Telehealth: Payer: Self-pay | Admitting: Cardiology

## 2010-11-07 MED ORDER — METOPROLOL SUCCINATE ER 50 MG PO TB24: 25.0000 mg | ORAL_TABLET | Freq: Every day | ORAL | Status: DC

## 2010-11-07 NOTE — Telephone Encounter (Signed)
Pt wants refill of metoprolol, he doesn't have any pills left . Call to [facility] on [address].

## 2010-11-07 NOTE — Telephone Encounter (Signed)
Metoprolol Rx refill sent to [facility] pharmacy. Vikki Ports

## 2010-11-09 NOTE — H&P (Signed)
NAME:  Nathan Mills, Nathan Mills NO.:  [id number]  MEDICAL RECORD NO.:  [id number]  LOCATION:  [facility]                         FACILITY:  [facility]  PHYSICIAN:  Jonny Ruiz, MD    DATE OF BIRTH:  July 13, 1917  DATE OF ADMISSION:  10/06/2010 DATE OF DISCHARGE:                             HISTORY & PHYSICAL   CHIEF COMPLAINT:  Cough.  HISTORY OF PRESENT ILLNESS:  The patient is a 77 year old man who was sent from the office of his primary care physician, Dr. Duane Lope, because of productive cough for the last 10 days associated to poor p.o. intake, malaise, and 9 pounds weight loss.  The patient also showed a pulse oximetry of 93% in the office and therefore he was referred to the ED for evaluation and management.  In the ER, the patient was already seen by Dr. __________ who performed a chest x-ray on the patient and found him with increased densities in the right lower lobe consistent with pneumonia or aspiration.  In addition, he had hyperinflation and emphysematous changes.  There was question of a nodular density in the right upper lung and a CT was recommended (not performed yet). Otherwise, the patient's only complaint is of congestion and cough of yellow sputum for the last 10 days, for which he sought medical attention this morning.  He denies chest pain or shortness of breath. Denies fever, chills or night sweats.  PAST MEDICAL HISTORY:  Significant for: 1. TIA in 2011, on Plavix 75 mg a day. 2. Syncope. 3. Right occipital stroke. 4. Trigeminal neuralgia, status post surgery with permanent numbness     in the right lower face. 5. Recurrent UTIs. 6. Coronary artery disease with prior myocardial infarction.  His     cardiologist Dr. Riley Kill. 7. Depression with PTSD for which he sees Dr. Tracey Harries at [facility]. 8. Esophageal stricture for which he sees Dr. Marina Goodell. 9. BPH, for which he sees Dr. Aldean Ast. 10.Hyperlipidemia. 11.Hiatal  hernia. 12.History of nephrolithiasis, status post lithotripsy. 13.Gastroesophageal reflux disease. 14.COPD. 15.  CURRENT MEDICATIONS: 1. Caduet 5/20 one tablet daily. 2. Lexapro 10 mg once a day. 3. Plavix 75 mg once a day. 4. Proscar 5 mg once a day. 5. Toprol XL 50 mg extended release, half tablet once a day. 6. Trazodone 50 mg 2 tablets at bedtime. 7. Xanax 0.5 mg p.o. three times a day. 8. Garlic tablet. 9. Vitamin D. 10.Vitamin B12. 11.Prilosec 10 mg a day. 12.Omeprazole 10 mg a day.  ALLERGIES:  No known drug allergies.  FAMILY HISTORY:  Not pertinent.  SOCIAL HISTORY:  The patient is retired.  Former smoker, quit over 40 years ago.  No alcohol.  Fairly active, walks almost every day.  He lives with his wife and he has two girls; one of them at the bedside. Wife at the bedside as well.  REVIEW OF SYSTEMS:  CONSTITUTIONAL:  Denies fever, chills or night sweats.  Positive for weight loss or malaise.  HEENT:  The patient is very hard of hearing on the right.  He has been having dental procedures last week and lost some blood.  CARDIOVASCULAR:  Denies chest pain, palpitations,  orthopnea, nocturia, PND or syncope.  RESPIRATORY:  See HPI.  Denies hemoptysis.  GI:  Denies abdominal pain, nausea, vomiting, diarrhea, hematochezia or melena.  GU:  Denies dysuria, frequency or hematuria.  MUSCULOSKELETAL: The patient complains of left wrist pain after he fell and hit the wall 2 days ago.  No loss of consciousness.  PHYSICAL EXAMINATION:  VITAL SIGNS:  Blood pressure 147/53, pulse 63, respirations 16, temperature 98.1, pulse oximetry 94% on room air. GENERAL:  The patient is a fragile, elderly Caucasian man who appears in no acute distress.  His respirations are unlabored.  HEENT:  Some dark blood residues in the oral cavity, otherwise unremarkable. NECK:  Supple.  No JVD. HEART:  Regular S1, S2 without gallops, murmurs or rubs. LUNGS:  Bilateral rhonchi and crackles on the  right base. ABDOMEN:  Soft, nontender.  No organomegaly or masses palpable. EXTREMITIES:  Left wrist is covered with clean bandage.  He is able to flex and extend the hand without much difficulty.  No clubbing, cyanosis or edema. NEUROLOGIC:  Essentially nonfocal.  LABORATORY DATA:  WBC 6.3, hemoglobin 10.7, hematocrit 31.7, MCV 97.8, platelet count 216, neutrophils 67%, lymphocytes 23%.  Sodium 139, potassium 4.2, chloride 104, carbon dioxide 26, glucose 156, BUN 24, creatinine 0.73.  Calcium 8.8, total protein 6.8, albumin 3.1, AST 15. Troponin negative.  Chest x-ray:  See above.  EKG:  Normal sinus rhythm without ST or T-wave abnormalities.  ASSESSMENT:  77 year old man presenting with malaise, productive cough of yellow sputum, a weight loss of 9 pounds, and anemia.  Abnormal chest x-ray suggestive of chronic obstructive pulmonary disease with possible pneumonia versus aspiration.  In addition, there is a nodular density in the right upper lobe of questionable etiology.  PLAN: 1. Admit the patient to observation.  Start the patient on Levaquin     and obtain CT scan of the lungs.  Repeat CBC     in a.m.  Discuss Living Will and Code Status with the patient and     wife.  The patient requested to be Full Code for the time being. 2. Injury to the left wrist after a fall:  Obtain x-ray of the left     wrist, AP and lateral.          ______________________________ Jonny Ruiz, MD     GL/MEDQ  D:  10/06/2010  T:  10/06/2010  Job:  [id number]  cc:   Magnus Sinning) Tenny Craw, M.D. Fax: [phone]  Electronically Signed by Jonny Ruiz MD on 11/09/2010 09:37:06 PM

## 2010-12-01 ENCOUNTER — Other Ambulatory Visit: Payer: Self-pay | Admitting: Cardiology

## 2010-12-01 DIAGNOSIS — I6529 Occlusion and stenosis of unspecified carotid artery: Secondary | ICD-10-CM

## 2010-12-02 ENCOUNTER — Encounter (INDEPENDENT_AMBULATORY_CARE_PROVIDER_SITE_OTHER): Payer: Medicare Other | Admitting: *Deleted

## 2010-12-02 ENCOUNTER — Encounter: Payer: Medicare Other | Admitting: *Deleted

## 2010-12-02 DIAGNOSIS — I6529 Occlusion and stenosis of unspecified carotid artery: Secondary | ICD-10-CM

## 2010-12-10 ENCOUNTER — Telehealth: Payer: Self-pay | Admitting: Cardiology

## 2010-12-10 NOTE — Telephone Encounter (Signed)
Fu call Pt calling back about test results Please call him back

## 2010-12-10 NOTE — Telephone Encounter (Signed)
Gave results of stable plaque on carotid doppler/ fu in one year.

## 2011-02-24 ENCOUNTER — Encounter: Payer: Self-pay | Admitting: *Deleted

## 2011-03-04 ENCOUNTER — Encounter: Payer: Self-pay | Admitting: Cardiology

## 2011-03-04 ENCOUNTER — Ambulatory Visit (INDEPENDENT_AMBULATORY_CARE_PROVIDER_SITE_OTHER): Payer: Medicare Other | Admitting: Cardiology

## 2011-03-04 VITALS — BP 160/84 | HR 56 | Ht 68.5 in | Wt 128.1 lb

## 2011-03-04 DIAGNOSIS — I2510 Atherosclerotic heart disease of native coronary artery without angina pectoris: Secondary | ICD-10-CM

## 2011-03-04 DIAGNOSIS — I10 Essential (primary) hypertension: Secondary | ICD-10-CM

## 2011-03-04 DIAGNOSIS — I6529 Occlusion and stenosis of unspecified carotid artery: Secondary | ICD-10-CM

## 2011-03-04 NOTE — Progress Notes (Signed)
HPI:  He is doing really well.  Denies any chest pain.  Has been losing some weight, and I believe Dr. Tenny Craw has been evaluating that.  We did not have access to his labs.  He does not drive.  No syncope or presyncope.  Overall, getting along quite well.  Feels good.  No progressive shortness of breath.  Since I last saw him he was in the hospital with pneumonia.  I am assuming his follow xray done with Dr. Tenny Craw.    Current Outpatient Prescriptions  Medication Sig Dispense Refill  . ALPRAZolam (XANAX) 0.5 MG tablet Take 0.5 mg by mouth daily.      Marland Kitchen amLODipine-atorvastatin (CADUET) 5-20 MG per tablet Take 1 tablet by mouth daily.  90 tablet  3  . clopidogrel (PLAVIX) 75 MG tablet Take 75 mg by mouth daily.      . diphenhydramine-acetaminophen (TYLENOL PM) 25-500 MG TABS Take 1 tablet by mouth at bedtime.      Marland Kitchen escitalopram (LEXAPRO) 10 MG tablet Take 10 mg by mouth daily.      . finasteride (PROSCAR) 5 MG tablet Take 5 mg by mouth daily.      Marland Kitchen gabapentin (NEURONTIN) 100 MG capsule Take 100 mg by mouth as needed.       . metoprolol (TOPROL XL) 50 MG 24 hr tablet Take 0.5 tablets (25 mg total) by mouth daily.  45 tablet  1  . omeprazole (PRILOSEC) 20 MG capsule Take 20 mg by mouth daily.        Allergies no known allergies  Past Medical History  Diagnosis Date  . CAD (coronary artery disease)   . Syncope   . TIA (transient ischemic attack)   . Zenker's diverticulum   . Hiatal hernia   . GERD (gastroesophageal reflux disease)   . Depression   . Nephrolithiasis   . Hyperlipidemia   . HTN (hypertension)     Past Surgical History  Procedure Date  . Laparoscopic cholecystectomy   . Implantation of a medtronic implantation loop recorder 03/01/2006    Dr. Ladona Ridgel    Family History  Problem Relation Age of Onset  . Coronary artery disease    . Cancer Daughter     eye cancer    History   Social History  . Marital Status: Married    Spouse Name: N/A    Number of Children: 2    . Years of Education: N/A   Occupational History  . retired     Research officer, political party   Social History Main Topics  . Smoking status: Former Games developer  . Smokeless tobacco: Not on file  . Alcohol Use: No  . Drug Use: No  . Sexually Active: Not on file   Other Topics Concern  . Not on file   Social History Narrative  . No narrative on file    ROS: Please see the HPI.  All other systems reviewed and negative.  PHYSICAL EXAM:  BP 160/84  Pulse 56  Ht 5' 8.5" (1.74 m)  Wt 128 lb 1.9 oz (58.115 kg)  BMI 19.20 kg/m2  General: elderly thin gentleman, in no acute distress. Head:  Normocephalic and atraumatic. Neck: no JVD Lungs: Clear to auscultation and percussion. Heart: Normal S1 and S2.  No murmur, rubs or gallops.  Abdomen:  Normal bowel sounds; soft; non tender; no organomegaly Pulses: Pulses normal in all 4 extremities. Extremities: No clubbing or cyanosis. No edema. Neurologic: Alert and oriented x 3.  EKG:  SB  PSVT beats.  LAD.  Inferior MI indeterminate age.  Delay in R wave progression.  Pause secondary to non conducted PACs.    ASSESSMENT AND PLAN:

## 2011-03-04 NOTE — Assessment & Plan Note (Signed)
-   Not on a statin. -Patient transitioned to comfort measures. 

## 2011-03-04 NOTE — Patient Instructions (Signed)
Your physician wants you to follow-up in: 1 YEAR.  You will receive a reminder letter in the mail two months in advance. If you don't receive a letter, please call our office to schedule the follow-up appointment.  Your physician recommends that you continue on your current medications as directed. Please refer to the Current Medication list given to you today.  

## 2011-03-04 NOTE — Assessment & Plan Note (Signed)
Slightly elevated. However, he checks it at [facility], and it is 126/58.  Continue current meds.

## 2011-03-04 NOTE — Assessment & Plan Note (Signed)
No recurrent angina.  Continue current meds.  I did discuss with him the interaction of omeprazole and clopidogrel, but he is doing so well he does not want to rock the boat.  Will continue the same.

## 2011-03-04 NOTE — Assessment & Plan Note (Signed)
Carotid doppler shows non obstruction bilaterally.  He will have follow in November of 2012.

## 2011-03-18 ENCOUNTER — Other Ambulatory Visit: Payer: Self-pay | Admitting: Cardiology

## 2011-05-03 ENCOUNTER — Other Ambulatory Visit: Payer: Self-pay | Admitting: Cardiology

## 2011-05-08 ENCOUNTER — Ambulatory Visit (INDEPENDENT_AMBULATORY_CARE_PROVIDER_SITE_OTHER): Payer: Medicare Other | Admitting: Nurse Practitioner

## 2011-05-08 ENCOUNTER — Encounter: Payer: Self-pay | Admitting: Nurse Practitioner

## 2011-05-08 VITALS — BP 140/75 | HR 64 | Ht 68.0 in | Wt 134.0 lb

## 2011-05-08 DIAGNOSIS — I4892 Unspecified atrial flutter: Secondary | ICD-10-CM

## 2011-05-08 DIAGNOSIS — I2510 Atherosclerotic heart disease of native coronary artery without angina pectoris: Secondary | ICD-10-CM

## 2011-05-08 DIAGNOSIS — I4891 Unspecified atrial fibrillation: Secondary | ICD-10-CM | POA: Insufficient documentation

## 2011-05-08 DIAGNOSIS — I10 Essential (primary) hypertension: Secondary | ICD-10-CM

## 2011-05-08 MED ORDER — METOPROLOL SUCCINATE ER 25 MG PO TB24: 25.0000 mg | ORAL_TABLET | Freq: Every day | ORAL | Status: DC

## 2011-05-08 MED ORDER — METOPROLOL SUCCINATE ER 50 MG PO TB24: 25.0000 mg | ORAL_TABLET | Freq: Every day | ORAL | Status: DC

## 2011-05-08 MED ORDER — ASPIRIN EC 81 MG PO TBEC: 81.0000 mg | DELAYED_RELEASE_TABLET | Freq: Every day | ORAL | Status: AC

## 2011-05-08 NOTE — Patient Instructions (Addendum)
Your physician recommends that you schedule a follow-up appointment in: 1 week with  Dr Riley Kill  Your physician has requested that you have an echocardiogram. Echocardiography is a painless test that uses sound waves to create images of your heart. It provides your doctor with information about the size and shape of your heart and how well your heart's chambers and valves are working. This procedure takes approximately one hour. There are no restrictions for this procedure.  Your physician has recommended you make the following change in your medication: START Aspirin 81 mg daily

## 2011-05-08 NOTE — Progress Notes (Signed)
Patient Name: Nathan Mills Date of Encounter: 05/08/2011  Primary Care Provider:  Miguel Aschoff, MD, MD Primary Cardiologist:  T. Riley Kill, MD  Patient Profile  78 y/o male with h/o remote MI who presents today after a 3 day h/o DOE and newly Dx afib.  Problem List   Past Medical History  Diagnosis Date  . CAD (coronary artery disease)     a. s/p remote MI 52  . Syncope   . TIA (transient ischemic attack)   . Zenker's diverticulum   . Hiatal hernia   . GERD (gastroesophageal reflux disease)   . Depression   . Nephrolithiasis   . Hyperlipidemia   . HTN (hypertension)   . Atrial fibrillation     a. Dx 04/2011   Past Surgical History  Procedure Date  . Laparoscopic cholecystectomy   . Implantation of a medtronic implantation loop recorder 03/01/2006    Dr. Ladona Ridgel    Allergies  No Known Allergies  HPI  78 y/o male with the above problem list.  He was in his USOH until 3 days ago when he began to experience DOE.  Yesterday morning he felt dyspneic @ rest.  He was seen by primary care and found to be in afib - though rate controlled.  Further, a cxr was performed and he was found to have pna (had been feeling warm, but no outright fevers, chills, cough).  He was placed on levaquin and labs were drawn.  I do not have the CBC result but his cmet and tsh are wnl.  His BNP was elevated @ 915 and he had right sided crackles.  He was given a Rx for lasix 20mg  daily and instructed to take it x 2 days.  Because of newly Dx afib, he was scheduled to see me today.  He reports that his DOE is slightly improved since yesterday.  He's been using the bathroom/voiding quite a bit since initiating lasix.  He remains in afib today but denies ever experiencing palpitations.  He further denies chest pain, pnd, orthopnea, or edema.  His appetite has fallen off some in the past 3 days.  Of note, pt got up in the middle of the night last night and fell.  He struck his bedside table but did not  sustain any injuries.  He says he simply lost his balance and did not lose consciousness.  Home Medications  Prior to Admission medications   Medication Sig Start Date End Date Taking? Authorizing Provider  ALPRAZolam Prudy Feeler) 0.5 MG tablet Take 0.5 mg by mouth daily.   Yes Historical Provider, MD  amLODipine-atorvastatin (CADUET) 5-20 MG per tablet Take 1 tablet by mouth daily. 05/13/10 05/13/11 Yes Herby Abraham, MD  clopidogrel (PLAVIX) 75 MG tablet TAKE 1 TABLET ONCE DAILY 03/18/11  Yes Herby Abraham, MD  Cyanocobalamin (VITAMIN B12 PO) Take by mouth daily.   Yes Historical Provider, MD  diphenhydramine-acetaminophen (TYLENOL PM) 25-500 MG TABS Take 1 tablet by mouth at bedtime.   Yes Historical Provider, MD  escitalopram (LEXAPRO) 10 MG tablet Take 10 mg by mouth daily.   Yes Historical Provider, MD  finasteride (PROSCAR) 5 MG tablet Take 5 mg by mouth daily.   Yes Historical Provider, MD  furosemide (LASIX) 20 MG tablet Take 20 mg by mouth daily.   Yes Historical Provider, MD  GARLIC PO Take by mouth.   Yes Historical Provider, MD  Levofloxacin (LEVAQUIN PO) Take 500 mg by mouth daily.   Yes Historical Provider, MD  metoprolol succinate (TOPROL-XL) 50 MG 24 hr tablet Take 1 tablet (50 mg total) by mouth daily. 05/08/11  Yes Tonny Bollman, MD  omeprazole (PRILOSEC) 20 MG capsule Take 20 mg by mouth daily.   Yes Historical Provider, MD  traZODone (DESYREL) 50 MG tablet Take 50 mg by mouth at bedtime.   Yes Historical Provider, MD  VITAMIN D, ERGOCALCIFEROL, PO Take by mouth daily.   Yes Historical Provider, MD  aspirin EC 81 MG tablet Take 1 tablet (81 mg total) by mouth daily. 05/08/11 05/07/12  Ok Anis, NP   Review of Systems Dyspnea and reduced appetite as per HPI.  Otherwise, No chest pain, n, v, dizziness, syncope, edema, early satiety, dysuria, dark stools, blood in stools, diarrhea, rash/skin changes, fevers, chills, wt loss/gain.  Otherwise all systems reviewed and  negative.  Physical Exam  Blood pressure 140/75, pulse 64, height 5\' 8"  (1.727 m), weight 134 lb (60.782 kg).  General: Pleasant, NAD Psych: Normal affect. Neuro: Alert and oriented X 3. Moves all extremities spontaneously. HEENT: Normal  Neck: Supple without bruits or JVD. Lungs:  Resp regular and unlabored, diminished breath sounds w/ crackles - r base/r middle. Heart: irreg, irreg, no s3, s4, or murmurs. Abdomen: Soft, non-tender, non-distended, BS + x 4.  Extremities: No clubbing, cyanosis or edema. DP/PT/Radials 2+ and equal bilaterally.  Accessory Clinical Findings  ECG - Coarse A. Fib, 64, LAD, old inf/ant infarct - no acute changes. (reviewed with Dr. Johney Frame)  Assessment & Plan  1.  Atrial Fibrillation:  ? Duration.  Pt has a 3 day h/o DOE in the setting of PNA and volume overload, with new Dx of afib.  He is very well rate controlled on bb therapy and b/c of his h/o orthostasis - would not titrate dose further.  Discussed his case with Dr. Riley Kill.  Pt is very unsteady on his feet and uses a walker.  He fell last year resulting in a significant laceration of his head with copious blood loss per his dtr.  He fell again just last night (no signif injury).  His CHADS2 score is prob 5 with current evidence of chf, h/o htn, age of 75, and h/o tia's.  That said, with his unsteady gait, his bleeding risk is likely equally high.  After discussion with Dr. Riley Kill (over the phone) and pt and family, who have real fears regarding his fall risk, we will continue Asa 81 and Plavix 75 for now.  Pt had labs with PCP yesterday (bmet and tsh wnl).  Will order a 2 d echo to be done early next week and we have arranged for f/u with Dr. Riley Kill @ the end of the week.  The real question will be how does the pt feel after pna has cleared.  If he cont to have doe and/or EF down on echo, we may need to readress rhythm mgmt at which point we will be looking at- at least short term systemic anticoagulation (if  dccv necessary).    2.  Acute CHF:  Pt doesn't weigh himself regularly but does remember being down to 128 at some point.  He was 137 lbs yesterday in PCP's office.  After being placed on lasix, his weight is down to 134lbs today and he does feel some better.  His volume looks very good today - neck veins are flat and no edema on exam.  Will order an echo to be done early next week to assess LV fxn.  Pt instructed to  take lasix for 2 days by primary care (2nd dose completed today) and given his euvolemic state today, think that that's a good plan.  He will see primary care again on Sunday, and [place] on Friday, at which point we can decide if pt requires daily lasix or not.  3.  CAD:  No chest pain.  Cont asa, bb, statin.    Nicolasa Ducking, NP 05/08/2011, 3:15 PM

## 2011-05-15 ENCOUNTER — Ambulatory Visit (INDEPENDENT_AMBULATORY_CARE_PROVIDER_SITE_OTHER): Payer: Medicare Other | Admitting: Cardiology

## 2011-05-15 ENCOUNTER — Ambulatory Visit (HOSPITAL_COMMUNITY): Payer: Medicare Other | Attending: Internal Medicine

## 2011-05-15 ENCOUNTER — Encounter: Payer: Self-pay | Admitting: Cardiology

## 2011-05-15 ENCOUNTER — Ambulatory Visit: Payer: Medicare Other | Admitting: Cardiology

## 2011-05-15 VITALS — BP 132/60 | HR 57 | Ht 68.0 in | Wt 135.0 lb

## 2011-05-15 DIAGNOSIS — I319 Disease of pericardium, unspecified: Secondary | ICD-10-CM | POA: Insufficient documentation

## 2011-05-15 DIAGNOSIS — I10 Essential (primary) hypertension: Secondary | ICD-10-CM | POA: Insufficient documentation

## 2011-05-15 DIAGNOSIS — E8779 Other fluid overload: Secondary | ICD-10-CM

## 2011-05-15 DIAGNOSIS — I4892 Unspecified atrial flutter: Secondary | ICD-10-CM

## 2011-05-15 DIAGNOSIS — Z8673 Personal history of transient ischemic attack (TIA), and cerebral infarction without residual deficits: Secondary | ICD-10-CM | POA: Insufficient documentation

## 2011-05-15 DIAGNOSIS — I4891 Unspecified atrial fibrillation: Secondary | ICD-10-CM

## 2011-05-15 DIAGNOSIS — R0989 Other specified symptoms and signs involving the circulatory and respiratory systems: Secondary | ICD-10-CM

## 2011-05-15 DIAGNOSIS — R0609 Other forms of dyspnea: Secondary | ICD-10-CM

## 2011-05-15 DIAGNOSIS — I059 Rheumatic mitral valve disease, unspecified: Secondary | ICD-10-CM | POA: Insufficient documentation

## 2011-05-15 DIAGNOSIS — I2510 Atherosclerotic heart disease of native coronary artery without angina pectoris: Secondary | ICD-10-CM

## 2011-05-15 DIAGNOSIS — E8770 Fluid overload, unspecified: Secondary | ICD-10-CM

## 2011-05-15 MED ORDER — POTASSIUM CHLORIDE ER 10 MEQ PO TBCR: 10.0000 meq | EXTENDED_RELEASE_TABLET | Freq: Every day | ORAL | Status: DC

## 2011-05-15 MED ORDER — PANTOPRAZOLE SODIUM 40 MG PO TBEC: 40.0000 mg | DELAYED_RELEASE_TABLET | Freq: Every day | ORAL | Status: DC

## 2011-05-15 NOTE — Patient Instructions (Signed)
Your physician has recommended you make the following change in your medication: STOP Omeprazole (Prilosec), START Protonix 40mg  take one by mouth daily, START Furosemide 20mg  take one by mouth daily, START Potassium Chloride take one by mouth daily  Your physician recommends that you schedule a follow-up appointment on Monday--05/18/11.  A chest x-ray takes a picture of the organs and structures inside the chest, including the heart, lungs, and blood vessels. This test can show several things, including, whether the heart is enlarges; whether fluid is building up in the lungs; and whether pacemaker / defibrillator leads are still in place. PLEASE HAVE THIS DONE ON Monday prior to your appointment with Dr Hans Eden should be done in the basement at the [facility] office on [facility].

## 2011-05-18 ENCOUNTER — Ambulatory Visit (INDEPENDENT_AMBULATORY_CARE_PROVIDER_SITE_OTHER): Payer: Medicare Other | Admitting: Cardiology

## 2011-05-18 ENCOUNTER — Encounter: Payer: Self-pay | Admitting: Cardiology

## 2011-05-18 ENCOUNTER — Ambulatory Visit (INDEPENDENT_AMBULATORY_CARE_PROVIDER_SITE_OTHER)
Admission: RE | Admit: 2011-05-18 | Discharge: 2011-05-18 | Disposition: A | Discharge status: Home / Self Care | Payer: Medicare Other | Source: Ambulatory Visit | Attending: Cardiology | Admitting: Cardiology

## 2011-05-18 VITALS — BP 128/64 | HR 57 | Ht 68.0 in | Wt 132.0 lb

## 2011-05-18 DIAGNOSIS — I10 Essential (primary) hypertension: Secondary | ICD-10-CM

## 2011-05-18 DIAGNOSIS — I059 Rheumatic mitral valve disease, unspecified: Secondary | ICD-10-CM

## 2011-05-18 DIAGNOSIS — I4891 Unspecified atrial fibrillation: Secondary | ICD-10-CM

## 2011-05-18 DIAGNOSIS — I2510 Atherosclerotic heart disease of native coronary artery without angina pectoris: Secondary | ICD-10-CM

## 2011-05-18 DIAGNOSIS — I340 Nonrheumatic mitral (valve) insufficiency: Secondary | ICD-10-CM

## 2011-05-18 MED ORDER — FUROSEMIDE 20 MG PO TABS: 20.0000 mg | ORAL_TABLET | Freq: Every day | ORAL | Status: DC

## 2011-05-18 NOTE — Assessment & Plan Note (Signed)
New onset of rhythm abnormality.  Echo is pending.  Rate is controlled.  He is a poor candidate for aggressive anticoagulation with frequent falls.  He is on DAPT, and this might be an intermediaty between full and partial anticoagulation, although this strategy is not definitely proven.  We will also change his PPI given his use of clopidgrel.

## 2011-05-18 NOTE — Assessment & Plan Note (Signed)
No recurrent angina 

## 2011-05-18 NOTE — Progress Notes (Signed)
HPI:  [name] is seen in follow up.  He was seen by Christain Sacramento in the clinic, with new onset of atrial fibrillation.  The reason for the visit was worsening shortness of breath.  He was placed on furosemide for a couple of days, and then this was stopped.  His family MD did tell him that he should weigh himself daily, and take furosemide if in fact his weight went above a certain threshold.  When he saw Thayer Ohm, his weight was down from 137 to 128, and the diuretics were held.  Echo was done today with the results pending.  In discussing anticoagulation, his family tells me that he has fallen in the past few weeks twice.  He fell once previously, and required treatment from bleeding.  Also, he was noted to have pneumonia, and was started on Levaquin.    Current Outpatient Prescriptions  Medication Sig Dispense Refill  . ALPRAZolam (XANAX) 0.5 MG tablet Take 0.5 mg by mouth daily.      Marland Kitchen amLODipine-atorvastatin (CADUET) 5-20 MG per tablet Take 1 tablet by mouth daily.      Marland Kitchen aspirin EC 81 MG tablet Take 1 tablet (81 mg total) by mouth daily.  150 tablet  2  . clopidogrel (PLAVIX) 75 MG tablet TAKE 1 TABLET ONCE DAILY  90 tablet  3  . Cyanocobalamin (VITAMIN B12 PO) Take by mouth daily.      . diphenhydramine-acetaminophen (TYLENOL PM) 25-500 MG TABS Take 1 tablet by mouth at bedtime.      Marland Kitchen escitalopram (LEXAPRO) 10 MG tablet Take 10 mg by mouth daily.      . finasteride (PROSCAR) 5 MG tablet Take 5 mg by mouth daily.      . furosemide (LASIX) 20 MG tablet Take 1 tablet (20 mg total) by mouth daily.  1 tablet  0  . GARLIC PO Take by mouth.      . Levofloxacin (LEVAQUIN PO) Take 500 mg by mouth daily.      . metoprolol succinate (TOPROL-XL) 25 MG 24 hr tablet Take 1 tablet (25 mg total) by mouth daily.  90 tablet  1  . traZODone (DESYREL) 50 MG tablet Take 50 mg by mouth at bedtime.      Marland Kitchen VITAMIN D, ERGOCALCIFEROL, PO Take by mouth daily.      . pantoprazole (PROTONIX) 40 MG tablet Take 1  tablet (40 mg total) by mouth daily.  90 tablet  3  . potassium chloride (K-DUR) 10 MEQ tablet Take 1 tablet (10 mEq total) by mouth daily.  30 tablet  6    No Known Allergies  Past Medical History  Diagnosis Date  . CAD (coronary artery disease)     a. s/p remote MI 56  . Syncope   . TIA (transient ischemic attack)   . Zenker's diverticulum   . Hiatal hernia   . GERD (gastroesophageal reflux disease)   . Depression   . Nephrolithiasis   . Hyperlipidemia   . HTN (hypertension)   . Atrial fibrillation     a. Dx 04/2011    Past Surgical History  Procedure Date  . Laparoscopic cholecystectomy   . Implantation of a medtronic implantation loop recorder 03/01/2006    Dr. Ladona Ridgel    Family History  Problem Relation Age of Onset  . Coronary artery disease    . Cancer Daughter     eye cancer    History   Social History  . Marital Status: Married  Spouse Name: N/A    Number of Children: 2  . Years of Education: N/A   Occupational History  . retired     Research officer, political party   Social History Main Topics  . Smoking status: Former Smoker    Quit date: 05/07/1961  . Smokeless tobacco: Not on file  . Alcohol Use: No  . Drug Use: No  . Sexually Active: Not on file   Other Topics Concern  . Not on file   Social History Narrative  . No narrative on file    ROS: Please see the HPI.  All other systems reviewed and negative.  PHYSICAL EXAM:  BP 132/60  Pulse 57  Ht 5\' 8"  (1.727 m)  Wt 135 lb (61.236 kg)  BMI 20.53 kg/m2  General: Well developed, well nourished, in no acute distress. Head:  Normocephalic and atraumatic. Neck: no JVD Lungs: Bilateral crackles heard in both lung fields.   Heart: Irregularly irregular rhythm.  Apical murmur.    Abdomen:  Normal bowel sounds; soft; non tender; no organomegaly Pulses: Pulses normal in all 4 extremities. Extremities: No clubbing or cyanosis. trace edema. Neurologic: Alert and oriented x 3.  EKG:  Last tracing  reviewed.    ASSESSMENT AND PLAN:

## 2011-05-18 NOTE — Assessment & Plan Note (Signed)
Controlled.  

## 2011-05-18 NOTE — Assessment & Plan Note (Signed)
This is new.  He is stable.  With the echo findings, the liklihood of successful version to NSR over the long haul seems unlikely.  As such, would lean toward continued rate control.  He also is falling, as previously noted, so that continued use of asa/plavix would seem the best compromise between bleeding and thrombotic risk.

## 2011-05-18 NOTE — Progress Notes (Signed)
HPI:  [name] returns for follow up.  He is stable and feeling less short of breath.  He, as previously noted, has fallen twice in the last couple of weeks, and also has had a prior area of head trauma.  No chest pain.  Echo results reviewed.  Options for treatment discussed today.    Current Outpatient Prescriptions  Medication Sig Dispense Refill  . ALPRAZolam (XANAX) 0.5 MG tablet Take 0.5 mg by mouth daily.      Marland Kitchen amLODipine-atorvastatin (CADUET) 5-20 MG per tablet Take 1 tablet by mouth daily.      Marland Kitchen aspirin EC 81 MG tablet Take 1 tablet (81 mg total) by mouth daily.  150 tablet  2  . clopidogrel (PLAVIX) 75 MG tablet TAKE 1 TABLET ONCE DAILY  90 tablet  3  . Cyanocobalamin (VITAMIN B12 PO) Take by mouth daily.      . diphenhydramine-acetaminophen (TYLENOL PM) 25-500 MG TABS Take 1 tablet by mouth at bedtime.      Marland Kitchen escitalopram (LEXAPRO) 10 MG tablet Take 10 mg by mouth daily.      . finasteride (PROSCAR) 5 MG tablet Take 5 mg by mouth daily.      . furosemide (LASIX) 20 MG tablet Take 1 tablet (20 mg total) by mouth daily.  1 tablet  0  . GARLIC PO Take by mouth.      . Levofloxacin (LEVAQUIN PO) Take 500 mg by mouth daily.      . metoprolol succinate (TOPROL-XL) 25 MG 24 hr tablet Take 1 tablet (25 mg total) by mouth daily.  90 tablet  1  . pantoprazole (PROTONIX) 40 MG tablet Take 1 tablet (40 mg total) by mouth daily.  90 tablet  3  . potassium chloride (K-DUR) 10 MEQ tablet Take 1 tablet (10 mEq total) by mouth daily.  30 tablet  6  . traZODone (DESYREL) 50 MG tablet Take 50 mg by mouth at bedtime.      Marland Kitchen VITAMIN D, ERGOCALCIFEROL, PO Take by mouth daily.        No Known Allergies  Past Medical History  Diagnosis Date  . CAD (coronary artery disease)     a. s/p remote MI 63  . Syncope   . TIA (transient ischemic attack)   . Zenker's diverticulum   . Hiatal hernia   . GERD (gastroesophageal reflux disease)   . Depression   . Nephrolithiasis   . Hyperlipidemia     . HTN (hypertension)   . Atrial fibrillation     a. Dx 04/2011    Past Surgical History  Procedure Date  . Laparoscopic cholecystectomy   . Implantation of a medtronic implantation loop recorder 03/01/2006    Dr. Ladona Ridgel    Family History  Problem Relation Age of Onset  . Coronary artery disease    . Cancer Daughter     eye cancer    History   Social History  . Marital Status: Married    Spouse Name: N/A    Number of Children: 2  . Years of Education: N/A   Occupational History  . retired     Research officer, political party   Social History Main Topics  . Smoking status: Former Smoker    Quit date: 05/07/1961  . Smokeless tobacco: Not on file  . Alcohol Use: No  . Drug Use: No  . Sexually Active: Not on file   Other Topics Concern  . Not on file   Social History Narrative  .  No narrative on file    ROS: Please see the HPI.  All other systems reviewed and negative.  PHYSICAL EXAM:  BP 128/64  Pulse 57  Ht 5\' 8"  (1.727 m)  Wt 132 lb (59.875 kg)  BMI 20.07 kg/m2  General: Elderly gentleman, but alert, in no distress Head:  Normocephalic and atraumatic. Neck: no JVD Lungs: Some scattered crackles, but less than previously Heart: irregularly irregular, with apical murmur.   Abdomen:  Normal bowel sounds; soft; non tender; no organomegaly Pulses: Pulses normal in all 4 extremities. Extremities: No clubbing or cyanosis. No edema. Neurologic: Alert and oriented x 3.   EKG:  Atrial fibrillation with controlled/slow ventricular response.  Old inferior MI.  T inversion inferior and laterally, chronic.Marland Kitchen    ECHO:   Study Conclusions  - Left ventricle: LVEF is approximately 55% with akinesis and aneurysmal dilitation of the inferior wall (base, md). The cavity size was normal. Wall thickness was increased in a pattern of mild LVH. - Mitral valve: Moderate to severe regurgitation. - Left atrium: The atrium was moderately dilated. - Right atrium: The atrium was  moderately dilated. - Pulmonary arteries: PA peak pressure: 50mm Hg (S). - Pericardium, extracardiac: A small pericardial effusion was identified.    ASSESSMENT AND PLAN:

## 2011-05-18 NOTE — Assessment & Plan Note (Signed)
The MR is likely due to his wall motion abnormality and PMD.  He is improved with low dose diuretics.  He has scales, and will weigh on a regular basis.  If elevated, additional furosemide can be given.

## 2011-05-18 NOTE — Patient Instructions (Signed)
Your physician recommends that you have lab work today: BMP and CBC  Your physician recommends that you schedule a follow-up appointment in: 1 WEEK with Dr Stuckey  Your physician recommends that you continue on your current medications as directed. Please refer to the Current Medication list given to you today.  

## 2011-05-18 NOTE — Assessment & Plan Note (Addendum)
He sounds wet on exam, so we will repeat his CXR soon. We will also add a daily diuretic and he will continue to monitor his weight.

## 2011-05-19 LAB — BASIC METABOLIC PANEL
CO2: 29 mEq/L (ref 19–32)
GFR: 70.23 mL/min (ref >60.00)
Glucose, Bld: 146 mg/dL — ABNORMAL HIGH (ref 70–99)
Potassium: 4.9 mEq/L (ref 3.5–5.1)
Sodium: 143 mEq/L (ref 135–145)

## 2011-05-19 LAB — CBC WITH DIFFERENTIAL/PLATELET
Basophils Absolute: 0.1 10*3/uL (ref 0.0–0.1)
Eosinophils Relative: 6.6 % — ABNORMAL HIGH (ref 0.0–5.0)
Monocytes Absolute: 0.4 10*3/uL (ref 0.1–1.0)
Monocytes Relative: 7.1 % (ref 3.0–12.0)
Neutrophils Relative %: 58.3 % (ref 43.0–77.0)
Platelets: 132 10*3/uL — ABNORMAL LOW (ref 150.0–400.0)
WBC: 5.3 10*3/uL (ref 4.5–10.5)

## 2011-05-21 ENCOUNTER — Telehealth: Payer: Self-pay | Admitting: Cardiology

## 2011-05-21 NOTE — Telephone Encounter (Signed)
New Problem:    Patient called returning Lauren's call to him for yesterday about the results of the various test and scans he recently had done.  Please call back.

## 2011-05-21 NOTE — Telephone Encounter (Signed)
Gave pt results of ECHO and labs--advised his platelets were low,but dr Riley Kill will dicuss with him at next appoint--also gave results CXR--attempted to explain results to pt but he sounded alittle unsure of what i told him, and i don't see any phone numbers listed other than wife--nt

## 2011-05-22 NOTE — Telephone Encounter (Signed)
I spoke with the pt and made him aware of lab and chest x-ray results.  Dr Riley Kill discussed this pt's echo with pt and family during recent office visit.  The pt is scheduled to follow-up with Dr Riley Kill again on 05/29/11.

## 2011-05-28 ENCOUNTER — Other Ambulatory Visit: Payer: Self-pay | Admitting: Cardiology

## 2011-05-29 ENCOUNTER — Ambulatory Visit (INDEPENDENT_AMBULATORY_CARE_PROVIDER_SITE_OTHER): Payer: Medicare Other | Admitting: Cardiology

## 2011-05-29 ENCOUNTER — Encounter: Payer: Self-pay | Admitting: Cardiology

## 2011-05-29 VITALS — BP 138/72 | HR 60 | Ht 68.0 in | Wt 129.8 lb

## 2011-05-29 DIAGNOSIS — I340 Nonrheumatic mitral (valve) insufficiency: Secondary | ICD-10-CM

## 2011-05-29 DIAGNOSIS — J8410 Pulmonary fibrosis, unspecified: Secondary | ICD-10-CM

## 2011-05-29 DIAGNOSIS — I059 Rheumatic mitral valve disease, unspecified: Secondary | ICD-10-CM

## 2011-05-29 DIAGNOSIS — I10 Essential (primary) hypertension: Secondary | ICD-10-CM

## 2011-05-29 NOTE — Patient Instructions (Signed)
Your physician recommends that you schedule a follow-up appointment in: 4 WEEKS  Your physician recommends that you continue on your current medications as directed. Please refer to the Current Medication list given to you today.   

## 2011-05-29 NOTE — Progress Notes (Signed)
HPI:  The patient returns in follow up. Overall he is doing pretty well. He's not had any more shortness of breath. Family says he is sleeping well, and he is able late down at night. He denies any orthopnea. He's not having any chest pain.  Current Outpatient Prescriptions  Medication Sig Dispense Refill  . ALPRAZolam (XANAX) 0.5 MG tablet Take 0.5 mg by mouth daily.      Marland Kitchen amLODipine-atorvastatin (CADUET) 5-20 MG per tablet TAKE 1 TABLET DAILY  90 tablet  3  . aspirin EC 81 MG tablet Take 1 tablet (81 mg total) by mouth daily.  150 tablet  2  . clopidogrel (PLAVIX) 75 MG tablet TAKE 1 TABLET ONCE DAILY  90 tablet  3  . Cyanocobalamin (VITAMIN B12 PO) Take by mouth daily.      . diphenhydramine-acetaminophen (TYLENOL PM) 25-500 MG TABS Take 1 tablet by mouth at bedtime.      Marland Kitchen escitalopram (LEXAPRO) 10 MG tablet Take 10 mg by mouth daily.      . finasteride (PROSCAR) 5 MG tablet Take 5 mg by mouth daily.      . furosemide (LASIX) 20 MG tablet Take 1 tablet (20 mg total) by mouth daily.  30 tablet  3  . GARLIC PO Take by mouth.      . metoprolol succinate (TOPROL-XL) 25 MG 24 hr tablet Take 1 tablet (25 mg total) by mouth daily.  90 tablet  1  . pantoprazole (PROTONIX) 40 MG tablet Take 1 tablet (40 mg total) by mouth daily.  90 tablet  3  . potassium chloride (K-DUR) 10 MEQ tablet Take 1 tablet (10 mEq total) by mouth daily.  30 tablet  6  . VITAMIN D, ERGOCALCIFEROL, PO Take by mouth daily.        No Known Allergies  Past Medical History  Diagnosis Date  . CAD (coronary artery disease)     a. s/p remote MI 69  . Syncope   . TIA (transient ischemic attack)   . Zenker's diverticulum   . Hiatal hernia   . GERD (gastroesophageal reflux disease)   . Depression   . Nephrolithiasis   . Hyperlipidemia   . HTN (hypertension)   . Atrial fibrillation     a. Dx 04/2011    Past Surgical History  Procedure Date  . Laparoscopic cholecystectomy   . Implantation of a medtronic  implantation loop recorder 03/01/2006    Dr. Ladona Ridgel    Family History  Problem Relation Age of Onset  . Coronary artery disease    . Cancer Daughter     eye cancer    History   Social History  . Marital Status: Married    Spouse Name: N/A    Number of Children: 2  . Years of Education: N/A   Occupational History  . retired     Research officer, political party   Social History Main Topics  . Smoking status: Former Smoker    Quit date: 05/07/1961  . Smokeless tobacco: Not on file  . Alcohol Use: No  . Drug Use: No  . Sexually Active: Not on file   Other Topics Concern  . Not on file   Social History Narrative  . No narrative on file    ROS: Please see the HPI.  All other systems reviewed and negative.  PHYSICAL EXAM:  BP 138/72  Pulse 60  Ht 5\' 8"  (1.727 m)  Wt 129 lb 12.8 oz (58.877 kg)  BMI 19.74 kg/m2  General: Well developed, well nourished, in no acute distress. Head:  Normocephalic and atraumatic. Neck: slight elevation of the jugular veins.   Lungs: Dry basilar crackles.   Heart: Normal S1 and S2 in pattern of irregularly, irregular rhythm.  Apical systolic murmur.   Abdomen:  Normal bowel sounds; soft; non tender; no organomegaly Pulses: Pulses normal in all 4 extremities. Extremities: No clubbing or cyanosis. trace edema. Neurologic: Alert and oriented x 3.  EKG:  See recent tracing. Afib.  CXR  IMPRESSION: Moderate cardiac silhouette enlargement. Hyperinflation configuration consistent with COPD. Chronic increase in reticular interstitial markings. Linear areas of fibrosis seen in left mid and lower lung area. Increased markings also seen within right medial base most likely reflect atelectasis and fibrosis. Chronic infection cannot be excluded. No acute superimposed infiltrate densities are identified. Other chronic abnormalities are detailed above.  Original Report Authenticated By: Crawford Givens, M.D.       Last Resulted: 05/18/11 4:47 PM     ASSESSMENT AND PLAN:  Evaluation time in excess of thirty minutes.

## 2011-05-30 NOTE — Assessment & Plan Note (Signed)
He has pretty significant MR, but he is holding his own.  Likely related to PMD.  He is thin, and sensitive to meds, so will continue to treat with diuretics mainly at his age.  He has soft basilar crackles, but his weight is down, and symptoms are better.  I will continue with close follow up in the office.

## 2011-05-30 NOTE — Assessment & Plan Note (Signed)
His exam could be a combination of both volume as well as some pulmonary fibrosis.  The crackles are dry so I think the amount of diuretic is probably appropriate and we should be guided in part by his weight.

## 2011-05-30 NOTE — Assessment & Plan Note (Signed)
Well-controlled on current regimen. ?

## 2011-07-06 ENCOUNTER — Encounter: Payer: Self-pay | Admitting: Cardiology

## 2011-07-06 ENCOUNTER — Ambulatory Visit (INDEPENDENT_AMBULATORY_CARE_PROVIDER_SITE_OTHER): Payer: Medicare Other | Admitting: Cardiology

## 2011-07-06 VITALS — BP 126/78 | HR 78 | Ht 68.0 in | Wt 128.0 lb

## 2011-07-06 DIAGNOSIS — J8410 Pulmonary fibrosis, unspecified: Secondary | ICD-10-CM

## 2011-07-06 DIAGNOSIS — I340 Nonrheumatic mitral (valve) insufficiency: Secondary | ICD-10-CM

## 2011-07-06 DIAGNOSIS — I059 Rheumatic mitral valve disease, unspecified: Secondary | ICD-10-CM

## 2011-07-06 DIAGNOSIS — I4891 Unspecified atrial fibrillation: Secondary | ICD-10-CM

## 2011-07-06 NOTE — Patient Instructions (Addendum)
Your physician recommends that you have lab work today: BMP and CBC  Your physician recommends that you schedule a follow-up appointment in: 6 WEEKS  Your physician recommends that you continue on your current medications as directed. Please refer to the Current Medication list given to you today.

## 2011-07-07 LAB — CBC WITH DIFFERENTIAL/PLATELET
Basophils Absolute: 0 10*3/uL (ref 0.0–0.1)
Basophils Relative: 0.2 % (ref 0.0–3.0)
Eosinophils Absolute: 0.3 10*3/uL (ref 0.0–0.7)
Lymphocytes Relative: 29.2 % (ref 12.0–46.0)
MCHC: 33 g/dL (ref 30.0–36.0)
Monocytes Relative: 5.7 % (ref 3.0–12.0)
Neutrophils Relative %: 59.5 % (ref 43.0–77.0)
RBC: 3.5 Mil/uL — ABNORMAL LOW (ref 4.22–5.81)
RDW: 14.4 % (ref 11.5–14.6)

## 2011-07-07 LAB — BASIC METABOLIC PANEL
CO2: 27 mEq/L (ref 19–32)
Calcium: 9.3 mg/dL (ref 8.4–10.5)
Creatinine, Ser: 1.1 mg/dL (ref 0.4–1.5)
GFR: 67.24 mL/min (ref >60.00)
Sodium: 141 mEq/L (ref 135–145)

## 2011-07-08 NOTE — Progress Notes (Signed)
HPI:  The patient is in for followup. He seems to be doing a lot better. He is 78 years old, and our goals been really quite conservative.  He's not had any chest pain at present time. His overall breathing has been somewhat better.  Current Outpatient Prescriptions  Medication Sig Dispense Refill  . ALPRAZolam (XANAX) 0.5 MG tablet Take 0.5 mg by mouth daily.      Marland Kitchen amLODipine-atorvastatin (CADUET) 5-20 MG per tablet TAKE 1 TABLET DAILY  90 tablet  3  . aspirin EC 81 MG tablet Take 1 tablet (81 mg total) by mouth daily.  150 tablet  2  . clopidogrel (PLAVIX) 75 MG tablet TAKE 1 TABLET ONCE DAILY  90 tablet  3  . Cyanocobalamin (VITAMIN B12 PO) Take by mouth daily.      . diphenhydramine-acetaminophen (TYLENOL PM) 25-500 MG TABS Take 1 tablet by mouth at bedtime.      Marland Kitchen escitalopram (LEXAPRO) 10 MG tablet Take 10 mg by mouth daily.      . finasteride (PROSCAR) 5 MG tablet Take 5 mg by mouth daily.      . furosemide (LASIX) 20 MG tablet Take 1 tablet (20 mg total) by mouth daily.  30 tablet  3  . gabapentin (NEURONTIN) 100 MG capsule as needed.      Marland Kitchen GARLIC PO Take by mouth.      . metoprolol succinate (TOPROL-XL) 25 MG 24 hr tablet Take 1 tablet (25 mg total) by mouth daily.  90 tablet  1  . pantoprazole (PROTONIX) 40 MG tablet Take 1 tablet (40 mg total) by mouth daily.  90 tablet  3  . potassium chloride (K-DUR) 10 MEQ tablet Take 1 tablet (10 mEq total) by mouth daily.  30 tablet  6  . VITAMIN D, ERGOCALCIFEROL, PO Take by mouth daily.        No Known Allergies  Past Medical History  Diagnosis Date  . CAD (coronary artery disease)     a. s/p remote MI 55  . Syncope   . TIA (transient ischemic attack)   . Zenker's diverticulum   . Hiatal hernia   . GERD (gastroesophageal reflux disease)   . Depression   . Nephrolithiasis   . Hyperlipidemia   . HTN (hypertension)   . Atrial fibrillation     a. Dx 04/2011    Past Surgical History  Procedure Date  . Laparoscopic  cholecystectomy   . Implantation of a medtronic implantation loop recorder 03/01/2006    Dr. Ladona Ridgel    Family History  Problem Relation Age of Onset  . Coronary artery disease    . Cancer Daughter     eye cancer    History   Social History  . Marital Status: Married    Spouse Name: N/A    Number of Children: 2  . Years of Education: N/A   Occupational History  . retired     Research officer, political party   Social History Main Topics  . Smoking status: Former Smoker    Quit date: 05/07/1961  . Smokeless tobacco: Not on file  . Alcohol Use: No  . Drug Use: No  . Sexually Active: Not on file   Other Topics Concern  . Not on file   Social History Narrative  . No narrative on file    ROS: Please see the HPI.  All other systems reviewed and negative.  PHYSICAL EXAM:  BP 126/78  Pulse 78  Ht 5\' 8"  (1.727 m)  Wt 128 lb (58.06 kg)  BMI 19.46 kg/m2  General: Well developed, well nourished, in no acute distress. Head:  Normocephalic and atraumatic. Neck: no JVD Lungs: Clear to auscultation and percussion. Heart: irregularly irregular, loud left apical murmur.    Abdomen:  Normal bowel sounds; soft; non tender; no organomegaly Pulses: Pulses normal in all 4 extremities. Extremities: No clubbing or cyanosis. No edema. Neurologic: Alert and oriented x 3.  EKG:  Atrial fib.  Left axis deviation.  Inferior MI, ild.  Nonspecific T wave changes.  ECHO  Study Conclusions  - Left ventricle: LVEF is approximately 55% with akinesis and aneurysmal dilitation of the inferior wall (base, md). The cavity size was normal. Wall thickness was increased in a pattern of mild LVH. - Mitral valve: Moderate to severe regurgitation. - Left atrium: The atrium was moderately dilated. - Right atrium: The atrium was moderately dilated. - Pulmonary arteries: PA peak pressure: 50mm Hg (S). - Pericardium, extracardiac: A small pericardial effusion was identified.       ASSESSMENT AND PLAN:

## 2011-07-26 NOTE — Assessment & Plan Note (Signed)
Probably MR and fluid overload on the basis of PMD, combined with AF.  He is remaining stable with a simple regimen, mainly with diuretics.  Could add an ACE, but probably hold for now with current meds.

## 2011-07-26 NOTE — Assessment & Plan Note (Signed)
Could contribute to SOB.

## 2011-07-26 NOTE — Assessment & Plan Note (Signed)
Rate is controlled.  Not a good candidate for anticoagulation.

## 2011-08-05 ENCOUNTER — Telehealth: Payer: Self-pay | Admitting: Cardiology

## 2011-08-05 NOTE — Telephone Encounter (Signed)
New Problem:    Patient called needing a refill of his pantoprazole (PROTONIX) 40 MG tablet filled at the [facility] on [address]. Phone#- [phone].  Patient would like this to be his permanent pharmacy for this medication.  Please call once the order has placed.

## 2011-08-06 ENCOUNTER — Other Ambulatory Visit: Payer: Self-pay | Admitting: *Deleted

## 2011-08-06 DIAGNOSIS — I2510 Atherosclerotic heart disease of native coronary artery without angina pectoris: Secondary | ICD-10-CM

## 2011-08-06 DIAGNOSIS — I4891 Unspecified atrial fibrillation: Secondary | ICD-10-CM

## 2011-08-06 MED ORDER — PANTOPRAZOLE SODIUM 40 MG PO TBEC: 40.0000 mg | DELAYED_RELEASE_TABLET | Freq: Every day | ORAL | Status: DC

## 2011-08-06 NOTE — Telephone Encounter (Signed)
Called patient to verify that his script for protonix was [facility]

## 2011-08-11 ENCOUNTER — Telehealth: Payer: Self-pay | Admitting: Cardiology

## 2011-08-11 NOTE — Telephone Encounter (Signed)
Forward 1 page from [facility] for review on 08-11-11 ym

## 2011-08-17 ENCOUNTER — Ambulatory Visit (INDEPENDENT_AMBULATORY_CARE_PROVIDER_SITE_OTHER): Payer: Medicare Other | Admitting: Cardiology

## 2011-08-17 ENCOUNTER — Encounter: Payer: Self-pay | Admitting: Cardiology

## 2011-08-17 VITALS — BP 110/66 | HR 85 | Resp 18 | Ht 68.0 in | Wt 127.8 lb

## 2011-08-17 DIAGNOSIS — I10 Essential (primary) hypertension: Secondary | ICD-10-CM

## 2011-08-17 DIAGNOSIS — I4891 Unspecified atrial fibrillation: Secondary | ICD-10-CM

## 2011-08-17 DIAGNOSIS — I059 Rheumatic mitral valve disease, unspecified: Secondary | ICD-10-CM

## 2011-08-17 DIAGNOSIS — I2510 Atherosclerotic heart disease of native coronary artery without angina pectoris: Secondary | ICD-10-CM

## 2011-08-17 DIAGNOSIS — I340 Nonrheumatic mitral (valve) insufficiency: Secondary | ICD-10-CM

## 2011-08-17 DIAGNOSIS — E785 Hyperlipidemia, unspecified: Secondary | ICD-10-CM

## 2011-08-17 NOTE — Patient Instructions (Addendum)
Your physician recommends that you schedule a follow-up appointment in: 2 MONTHS  Your physician recommends that you continue on your current medications as directed. Please refer to the Current Medication list given to you today.   

## 2011-08-17 NOTE — Progress Notes (Signed)
HPI:  The patient returns in followup. From a breathing standpoint, he is doing relatively well. Unfortunately, he is followed on at least 2 occasions. He ended up in the emergency room at [facility] in [place].  And falling, he broke one of his ribs. He was also noted to have a urinary tract infection, and they treated with more Septra DS 1 twice daily for 10 days. Since that time, he fell again, and hit his chin. Both times he was not using his walker, has been generally a little unsteady on his feet.  Current Outpatient Prescriptions  Medication Sig Dispense Refill  . ALPRAZolam (XANAX) 0.5 MG tablet Take 0.5 mg by mouth daily.      Marland Kitchen amLODipine-atorvastatin (CADUET) 5-20 MG per tablet TAKE 1 TABLET DAILY  90 tablet  3  . aspirin EC 81 MG tablet Take 1 tablet (81 mg total) by mouth daily.  150 tablet  2  . clopidogrel (PLAVIX) 75 MG tablet TAKE 1 TABLET ONCE DAILY  90 tablet  3  . Cyanocobalamin (VITAMIN B12 PO) Take by mouth daily.      . diphenhydramine-acetaminophen (TYLENOL PM) 25-500 MG TABS Take 1 tablet by mouth at bedtime.      Marland Kitchen escitalopram (LEXAPRO) 10 MG tablet Take 10 mg by mouth daily.      . finasteride (PROSCAR) 5 MG tablet Take 5 mg by mouth daily.      . furosemide (LASIX) 20 MG tablet Take 1 tablet (20 mg total) by mouth daily.  30 tablet  3  . gabapentin (NEURONTIN) 100 MG capsule as needed.      Marland Kitchen GARLIC PO Take by mouth.      . metoprolol succinate (TOPROL-XL) 25 MG 24 hr tablet Take 1 tablet (25 mg total) by mouth daily.  90 tablet  1  . pantoprazole (PROTONIX) 40 MG tablet Take 1 tablet (40 mg total) by mouth daily.  90 tablet  3  . potassium chloride (K-DUR) 10 MEQ tablet Take 1 tablet (10 mEq total) by mouth daily.  30 tablet  6  . sulfamethoxazole-trimethoprim (BACTRIM DS) 800-160 MG per tablet Take 1 tablet by mouth 2 (two) times daily.       . traMADol (ULTRAM) 50 MG tablet Take 50 mg by mouth every 6 (six) hours as needed.       Marland Kitchen VITAMIN D,  ERGOCALCIFEROL, PO Take by mouth daily.        No Known Allergies  Past Medical History  Diagnosis Date  . CAD (coronary artery disease)     a. s/p remote MI 85  . Syncope   . TIA (transient ischemic attack)   . Zenker's diverticulum   . Hiatal hernia   . GERD (gastroesophageal reflux disease)   . Depression   . Nephrolithiasis   . Hyperlipidemia   . HTN (hypertension)   . Atrial fibrillation     a. Dx 04/2011    Past Surgical History  Procedure Date  . Laparoscopic cholecystectomy   . Implantation of a medtronic implantation loop recorder 03/01/2006    Dr. Ladona Ridgel    Family History  Problem Relation Age of Onset  . Coronary artery disease    . Cancer Daughter     eye cancer    History   Social History  . Marital Status: Married    Spouse Name: N/A    Number of Children: 2  . Years of Education: N/A   Occupational History  . retired  postal service   Social History Main Topics  . Smoking status: Former Smoker    Quit date: 05/07/1961  . Smokeless tobacco: Not on file  . Alcohol Use: No  . Drug Use: No  . Sexually Active: Not on file   Other Topics Concern  . Not on file   Social History Narrative  . No narrative on file    ROS: Please see the HPI.  All other systems reviewed and negative.  PHYSICAL EXAM:  BP 110/66  Pulse 85  Resp 18  Ht 5\' 8"  (1.727 m)  Wt 127 lb 12.8 oz (57.97 kg)  BMI 19.43 kg/m2  SpO2 98%  General: Well developed, well nourished, in no acute distress. Head:  Normocephalic and atraumatic. Neck: no JVD Lungs: Clear to auscultation and percussion. Heart: Normal S1 and S2.  No murmur, rubs or gallops.  Abdomen:  Normal bowel sounds; soft; non tender; no organomegaly Pulses: Pulses normal in all 4 extremities. Extremities: No clubbing or cyanosis. No edema. Neurologic: Alert and oriented x 3.  ZOX:WRUEAV fibrillation.  Controlled ventricular response.  Prolonged QT.  No acute changes.    ASSESSMENT AND PLAN:

## 2011-08-27 NOTE — Assessment & Plan Note (Signed)
Controlled.  

## 2011-08-27 NOTE — Assessment & Plan Note (Signed)
Remains stable at the present time.   

## 2011-08-27 NOTE — Assessment & Plan Note (Signed)
No change at present

## 2011-08-27 NOTE — Assessment & Plan Note (Addendum)
He is rate controlled.  Despite his risk factors, it is obvious he is falling all the time now, so is not a good anticoagulant candidate.  We discussed at length today, and his family appears to be in agreement.

## 2011-10-21 ENCOUNTER — Encounter: Payer: Self-pay | Admitting: Cardiology

## 2011-10-21 ENCOUNTER — Ambulatory Visit (INDEPENDENT_AMBULATORY_CARE_PROVIDER_SITE_OTHER): Payer: Medicare Other | Admitting: Cardiology

## 2011-10-21 VITALS — BP 128/60 | HR 61 | Resp 18 | Ht 66.0 in | Wt 130.0 lb

## 2011-10-21 DIAGNOSIS — G459 Transient cerebral ischemic attack, unspecified: Secondary | ICD-10-CM

## 2011-10-21 DIAGNOSIS — I4891 Unspecified atrial fibrillation: Secondary | ICD-10-CM

## 2011-10-21 DIAGNOSIS — I340 Nonrheumatic mitral (valve) insufficiency: Secondary | ICD-10-CM

## 2011-10-21 DIAGNOSIS — I059 Rheumatic mitral valve disease, unspecified: Secondary | ICD-10-CM

## 2011-10-21 DIAGNOSIS — I2510 Atherosclerotic heart disease of native coronary artery without angina pectoris: Secondary | ICD-10-CM

## 2011-10-21 DIAGNOSIS — I6529 Occlusion and stenosis of unspecified carotid artery: Secondary | ICD-10-CM

## 2011-10-21 NOTE — Patient Instructions (Signed)
Your physician recommends that you schedule a follow-up appointment in: February 2014  Your physician recommends that you have lab work today: BMP and CBC  Your physician recommends that you continue on your current medications as directed. Please refer to the Current Medication list given to you today.

## 2011-10-21 NOTE — Assessment & Plan Note (Signed)
On DAPT 

## 2011-10-21 NOTE — Assessment & Plan Note (Signed)
Rate is controlled.  On DAPT.  Has history of falling.  Anticoagulation discussed at last office visit in detail  Will defer.

## 2011-10-21 NOTE — Assessment & Plan Note (Signed)
No recurrent symptoms.  Continue medical treatment.

## 2011-10-21 NOTE — Progress Notes (Signed)
. HPI:  The patient is seen in followup. From a cardiac standpoint he is doing pretty well. Since I last saw him, he has not fallen he has not had trauma. We had an extensive discussion during the past is about anticoagulation therapy. The patient remains really quite active. He is 89, and on occasion she does pool. Otherwise, he is been stable he denies any chest pain.  Current Outpatient Prescriptions  Medication Sig Dispense Refill  . ALPRAZolam (XANAX) 0.5 MG tablet Take 0.5 mg by mouth daily.      Marland Kitchen amLODipine-atorvastatin (CADUET) 5-20 MG per tablet TAKE 1 TABLET DAILY  90 tablet  3  . aspirin EC 81 MG tablet Take 1 tablet (81 mg total) by mouth daily.  150 tablet  2  . clopidogrel (PLAVIX) 75 MG tablet TAKE 1 TABLET ONCE DAILY  90 tablet  3  . Cyanocobalamin (VITAMIN B12 PO) Take by mouth daily.      . diphenhydramine-acetaminophen (TYLENOL PM) 25-500 MG TABS Take 1 tablet by mouth at bedtime.      Marland Kitchen escitalopram (LEXAPRO) 10 MG tablet Take 10 mg by mouth daily.      . finasteride (PROSCAR) 5 MG tablet Take 5 mg by mouth daily.      . furosemide (LASIX) 20 MG tablet Take 1 tablet (20 mg total) by mouth daily.  30 tablet  3  . gabapentin (NEURONTIN) 100 MG capsule as needed.      Marland Kitchen GARLIC PO Take by mouth.      . metoprolol succinate (TOPROL-XL) 25 MG 24 hr tablet Take 1 tablet (25 mg total) by mouth daily.  90 tablet  1  . pantoprazole (PROTONIX) 40 MG tablet Take 1 tablet (40 mg total) by mouth daily.  90 tablet  3  . potassium chloride (K-DUR) 10 MEQ tablet Take 1 tablet (10 mEq total) by mouth daily.  30 tablet  6  . sulfamethoxazole-trimethoprim (BACTRIM DS) 800-160 MG per tablet Take 1 tablet by mouth 2 (two) times daily.       . traMADol (ULTRAM) 50 MG tablet Take 50 mg by mouth every 6 (six) hours as needed.       Marland Kitchen VITAMIN D, ERGOCALCIFEROL, PO Take by mouth daily.        No Known Allergies  Past Medical History  Diagnosis Date  . CAD (coronary artery disease)     a. s/p  remote MI 48  . Syncope   . TIA (transient ischemic attack)   . Zenker's diverticulum   . Hiatal hernia   . GERD (gastroesophageal reflux disease)   . Depression   . Nephrolithiasis   . Hyperlipidemia   . HTN (hypertension)   . Atrial fibrillation     a. Dx 04/2011    Past Surgical History  Procedure Date  . Laparoscopic cholecystectomy   . Implantation of a medtronic implantation loop recorder 03/01/2006    Dr. Ladona Ridgel    Family History  Problem Relation Age of Onset  . Coronary artery disease    . Cancer Daughter     eye cancer    History   Social History  . Marital Status: Married    Spouse Name: N/A    Number of Children: 2  . Years of Education: N/A   Occupational History  . retired     Research officer, political party   Social History Main Topics  . Smoking status: Former Smoker    Quit date: 05/07/1961  . Smokeless tobacco: Not on  file  . Alcohol Use: No  . Drug Use: No  . Sexually Active: Not on file   Other Topics Concern  . Not on file   Social History Narrative  . No narrative on file    ROS: Please see the HPI.  All other systems reviewed and negative.  PHYSICAL EXAM:  BP 128/60  Pulse 61  Resp 18  Ht 5\' 6"  (1.676 m)  Wt 130 lb (58.968 kg)  BMI 20.98 kg/m2  SpO2 93%  General: Well developed, well nourished, in no acute distress. Head:  Normocephalic and atraumatic. Neck: no JVD Lungs: Clear to auscultation and percussion.  Minimal ronchii Heart: irregularly irregular with soft LSE murmur.   Abdomen:  Normal bowel sounds; soft; non tender; no organomegaly Pulses: Pulses normal in all 4 extremities. Extremities: No clubbing or cyanosis. No edema. Neurologic: Alert and oriented x 3.  EKG:  Atrial fibrillation with controlled ventricular response.  Delay in  R wave progression, cannot exclude septal MI.  Inferior and T inversion.    ASSESSMENT AND PLAN:

## 2011-10-21 NOTE — Assessment & Plan Note (Signed)
Moderate to severe MR.  Modest Pulm Htn.  Not a surgical candidate.

## 2011-10-21 NOTE — Assessment & Plan Note (Signed)
Moderate bilateral disease.  Will reorder when seen in February 2014.

## 2011-10-22 LAB — BASIC METABOLIC PANEL
CO2: 28 mEq/L (ref 19–32)
Chloride: 106 mEq/L (ref 96–112)
Potassium: 4.4 mEq/L (ref 3.5–5.1)

## 2011-10-22 LAB — CBC WITH DIFFERENTIAL/PLATELET
Basophils Absolute: 0 10*3/uL (ref 0.0–0.1)
Eosinophils Absolute: 0.3 10*3/uL (ref 0.0–0.7)
Hemoglobin: 11.2 g/dL — ABNORMAL LOW (ref 13.0–17.0)
Lymphocytes Relative: 21.9 % (ref 12.0–46.0)
MCHC: 32.9 g/dL (ref 30.0–36.0)
Monocytes Relative: 3.9 % (ref 3.0–12.0)
Neutrophils Relative %: 69.1 % (ref 43.0–77.0)
Platelets: 99 10*3/uL — ABNORMAL LOW (ref 150.0–400.0)
RDW: 14 % (ref 11.5–14.6)

## 2012-01-11 ENCOUNTER — Encounter (INDEPENDENT_AMBULATORY_CARE_PROVIDER_SITE_OTHER): Payer: Medicare Other

## 2012-01-11 DIAGNOSIS — I6529 Occlusion and stenosis of unspecified carotid artery: Secondary | ICD-10-CM

## 2012-02-05 ENCOUNTER — Telehealth: Payer: Self-pay | Admitting: Cardiology

## 2012-02-05 MED ORDER — METOPROLOL SUCCINATE ER 25 MG PO TB24: 25.0000 mg | ORAL_TABLET | Freq: Every day | ORAL | Status: DC

## 2012-02-05 NOTE — Telephone Encounter (Addendum)
Pt needs refill on toprolol sent to [facility] on [address] he is out and it needs to be 25 mg

## 2012-02-24 ENCOUNTER — Ambulatory Visit (INDEPENDENT_AMBULATORY_CARE_PROVIDER_SITE_OTHER): Payer: Medicare Other | Admitting: Cardiology

## 2012-02-24 ENCOUNTER — Encounter: Payer: Self-pay | Admitting: Cardiology

## 2012-02-24 VITALS — BP 147/60 | HR 58 | Ht 68.0 in | Wt 129.0 lb

## 2012-02-24 DIAGNOSIS — I340 Nonrheumatic mitral (valve) insufficiency: Secondary | ICD-10-CM

## 2012-02-24 DIAGNOSIS — I059 Rheumatic mitral valve disease, unspecified: Secondary | ICD-10-CM

## 2012-02-24 DIAGNOSIS — I4891 Unspecified atrial fibrillation: Secondary | ICD-10-CM

## 2012-02-24 DIAGNOSIS — I10 Essential (primary) hypertension: Secondary | ICD-10-CM

## 2012-02-24 DIAGNOSIS — I6529 Occlusion and stenosis of unspecified carotid artery: Secondary | ICD-10-CM

## 2012-02-24 DIAGNOSIS — I2510 Atherosclerotic heart disease of native coronary artery without angina pectoris: Secondary | ICD-10-CM

## 2012-02-24 LAB — BASIC METABOLIC PANEL
CO2: 29 mEq/L (ref 19–32)
Chloride: 101 mEq/L (ref 96–112)
Creatinine, Ser: 0.9 mg/dL (ref 0.4–1.5)
Potassium: 4 mEq/L (ref 3.5–5.1)
Sodium: 138 mEq/L (ref 135–145)

## 2012-02-24 NOTE — Progress Notes (Signed)
HPI:  Patient seen today in a followup visit. From a cardiac standpoint he does remain stable. He is using a walker, this was less prone to falling. He does however remain fragile, has had one perhaps 1 episode of falling since we saw him last. He remains on dual antiplatelet therapy. He has a prior head contusion, and this is contributed to lack of antithrombin treatment. He denies any ongoing chest pain  Current Outpatient Prescriptions  Medication Sig Dispense Refill  . ALPRAZolam (XANAX) 0.5 MG tablet Take 0.5 mg by mouth daily.      Marland Kitchen amLODipine-atorvastatin (CADUET) 5-20 MG per tablet TAKE 1 TABLET DAILY  90 tablet  3  . aspirin EC 81 MG tablet Take 1 tablet (81 mg total) by mouth daily.  150 tablet  2  . clopidogrel (PLAVIX) 75 MG tablet TAKE 1 TABLET ONCE DAILY  90 tablet  3  . Cyanocobalamin (VITAMIN B12 PO) Take by mouth daily.      . diphenhydramine-acetaminophen (TYLENOL PM) 25-500 MG TABS Take 1 tablet by mouth at bedtime.      Marland Kitchen escitalopram (LEXAPRO) 10 MG tablet Take 10 mg by mouth daily.      . finasteride (PROSCAR) 5 MG tablet Take 5 mg by mouth daily.      . furosemide (LASIX) 20 MG tablet Take 1 tablet (20 mg total) by mouth daily.  30 tablet  3  . gabapentin (NEURONTIN) 100 MG capsule as needed.      Marland Kitchen GARLIC PO Take by mouth.      . metoprolol succinate (TOPROL-XL) 25 MG 24 hr tablet Take 1 tablet (25 mg total) by mouth daily.  90 tablet  2  . pantoprazole (PROTONIX) 40 MG tablet Take 1 tablet (40 mg total) by mouth daily.  90 tablet  3  . potassium chloride (K-DUR) 10 MEQ tablet Take 1 tablet (10 mEq total) by mouth daily.  30 tablet  6  . sulfamethoxazole-trimethoprim (BACTRIM DS) 800-160 MG per tablet Take 1 tablet by mouth 2 (two) times daily.       . traMADol (ULTRAM) 50 MG tablet Take 50 mg by mouth every 6 (six) hours as needed.       Marland Kitchen VITAMIN D, ERGOCALCIFEROL, PO Take by mouth daily.       No current facility-administered medications for this visit.    No  Known Allergies  Past Medical History  Diagnosis Date  . CAD (coronary artery disease)     a. s/p remote MI 77  . Syncope   . TIA (transient ischemic attack)   . Zenker's diverticulum   . Hiatal hernia   . GERD (gastroesophageal reflux disease)   . Depression   . Nephrolithiasis   . Hyperlipidemia   . HTN (hypertension)   . Atrial fibrillation     a. Dx 04/2011    Past Surgical History  Procedure Laterality Date  . Laparoscopic cholecystectomy    . Implantation of a medtronic implantation loop recorder  03/01/2006    Dr. Ladona Ridgel    Family History  Problem Relation Age of Onset  . Coronary artery disease    . Cancer Daughter     eye cancer    History   Social History  . Marital Status: Married    Spouse Name: N/A    Number of Children: 2  . Years of Education: N/A   Occupational History  . retired     Research officer, political party   Social History Main Topics  .  Smoking status: Former Smoker    Quit date: 05/07/1961  . Smokeless tobacco: Not on file  . Alcohol Use: No  . Drug Use: No  . Sexually Active: Not on file   Other Topics Concern  . Not on file   Social History Narrative  . No narrative on file    ROS: Please see the HPI.  All other systems reviewed and negative.  PHYSICAL EXAM:  BP 147/60  Pulse 58  Ht 5\' 8"  (1.727 m)  Wt 129 lb (58.514 kg)  BMI 19.62 kg/m2  SpO2 91%  General: elderly, somewhat fragile gentleman in good spirits telling me about the end of WWII.  He served in the [employer].  Head:  Normocephalic and atraumatic. Neck: no JVD Lungs: Clear to auscultation and percussion. Heart: irregularly, irregular with prominent holosystolic murmur at LSE and apex.   Abdomen:  Normal bowel sounds; soft; non tender; no organomegaly Pulses: Pulses normal in all 4 extremities. Extremities: No clubbing or cyanosis. No edema. Neurologic: Alert and oriented x 3.  EKG:  Atrial fib with slow vent response.  Non specific T changes.  Leftward axis.    ASSESSMENT AND PLAN:  More than thirty minutes contact time.

## 2012-02-24 NOTE — Patient Instructions (Addendum)
Your physician wants you to follow-up in: 4 months with [name]. You will receive a reminder letter in the mail two months in advance. If you don't receive a letter, please call our office to schedule the follow-up appointment.  LABS TODAY:  BMET

## 2012-03-11 NOTE — Assessment & Plan Note (Signed)
Prominent murmur but volume seems stable.  Continue conservative management.

## 2012-03-11 NOTE — Assessment & Plan Note (Signed)
Controlled.  

## 2012-03-11 NOTE — Assessment & Plan Note (Signed)
Rate is controlled.  Only on low dose metoprolol

## 2012-03-11 NOTE — Assessment & Plan Note (Signed)
Not currently having any chest pain.  Remains stable.

## 2012-03-11 NOTE — Assessment & Plan Note (Signed)
Stable bilateral disease which is stable at present.

## 2012-03-29 ENCOUNTER — Other Ambulatory Visit: Payer: Self-pay | Admitting: *Deleted

## 2012-03-29 MED ORDER — CLOPIDOGREL BISULFATE 75 MG PO TABS: 75.0000 mg | ORAL_TABLET | Freq: Every day | ORAL | Status: DC

## 2012-04-28 ENCOUNTER — Telehealth: Payer: Self-pay | Admitting: Cardiology

## 2012-04-28 NOTE — Telephone Encounter (Signed)
I spoke with the pt's daughter and yesterday the pt went to lunch and when he came home he fell while walking into the house. The pt did put a "gash in arm" with fall.  The pt did not black out and thinks he just got his feet tangled. The pt does not have any evidence of injury to head but the daughter did not ask the pt if he hit his head.  She did check on the pt this morning and he was okay. The pt's daughter was concerned because the pt complained of neck pain the day before fall and was unsure if this was heart related.  I advised her that this was probably muscular or arthritic type of pain. She also mentioned that the pt's speech has been difficult to understand for about 2-3 weeks, like the pt's tongue is full and thick. Juliette Alcide has not noticed any one sided weakness or other symptoms. I made her aware that they should continue to monitor the pt for any mental changes or other symptoms and call the PCP if needed. I also instructed her to make sure that the pt's arm does not get infected.  If his arm shows any signs of infection she will contact PCP.

## 2012-04-28 NOTE — Telephone Encounter (Signed)
New Problem:    Called in because patient is complaining of pain in his neck and she wanted to know if he needed to be seen.  Please call back.

## 2012-06-03 ENCOUNTER — Other Ambulatory Visit: Payer: Self-pay | Admitting: *Deleted

## 2012-06-03 MED ORDER — AMLODIPINE-ATORVASTATIN 5-20 MG PO TABS: ORAL_TABLET | ORAL | Status: DC

## 2012-06-30 ENCOUNTER — Encounter: Payer: Self-pay | Admitting: Cardiology

## 2012-06-30 ENCOUNTER — Ambulatory Visit (INDEPENDENT_AMBULATORY_CARE_PROVIDER_SITE_OTHER): Payer: Medicare Other | Admitting: Cardiology

## 2012-06-30 VITALS — BP 132/64 | HR 60 | Ht 68.0 in | Wt 124.4 lb

## 2012-06-30 DIAGNOSIS — I4891 Unspecified atrial fibrillation: Secondary | ICD-10-CM

## 2012-06-30 DIAGNOSIS — I340 Nonrheumatic mitral (valve) insufficiency: Secondary | ICD-10-CM

## 2012-06-30 DIAGNOSIS — I059 Rheumatic mitral valve disease, unspecified: Secondary | ICD-10-CM

## 2012-06-30 DIAGNOSIS — I10 Essential (primary) hypertension: Secondary | ICD-10-CM

## 2012-06-30 DIAGNOSIS — I6529 Occlusion and stenosis of unspecified carotid artery: Secondary | ICD-10-CM

## 2012-06-30 DIAGNOSIS — E785 Hyperlipidemia, unspecified: Secondary | ICD-10-CM

## 2012-06-30 DIAGNOSIS — I2510 Atherosclerotic heart disease of native coronary artery without angina pectoris: Secondary | ICD-10-CM

## 2012-06-30 NOTE — Assessment & Plan Note (Signed)
Continue Plavix and statin. Conservative measures given age.

## 2012-06-30 NOTE — Assessment & Plan Note (Signed)
Conservative measures given age. 

## 2012-06-30 NOTE — Progress Notes (Signed)
HPI: 79 year old male previously followed by Dr. Riley Kill for followup of atrial fibrillation, coronary disease and cerebrovascular disease. The patient has a history of remote myocardial infarction in 1982. Carotid Dopplers in December of 2013 showed 40-59% bilateral stenosis and followup recommended in one year. Echocardiogram in may of 2013 showed an ejection fraction of 55%, moderate to severe mitral regurgitation and moderate biatrial enlargement. There was a small pericardial effusion. The patient is felt not to be a Coumadin candidate because of recurrent falls. Last seen in February of 2014. Since then, patient denies dyspnea, chest pain, palpitations or syncope. He continues to fall occasionally.   Current Outpatient Prescriptions  Medication Sig Dispense Refill  . ALPRAZolam (XANAX) 0.5 MG tablet Take 0.5 mg by mouth daily.      Marland Kitchen amLODipine-atorvastatin (CADUET) 5-20 MG per tablet TAKE 1 TABLET DAILY  90 tablet  3  . clopidogrel (PLAVIX) 75 MG tablet Take 1 tablet (75 mg total) by mouth daily.  90 tablet  3  . Cyanocobalamin (VITAMIN B12 PO) Take by mouth daily.      . DiphenhydrAMINE HCl (BENADRYL PO) Take by mouth at bedtime.      Marland Kitchen escitalopram (LEXAPRO) 10 MG tablet Take 10 mg by mouth daily.      Marland Kitchen gabapentin (NEURONTIN) 100 MG capsule as needed.      Marland Kitchen GARLIC PO Take by mouth.      . metoprolol succinate (TOPROL-XL) 25 MG 24 hr tablet Take 1 tablet (25 mg total) by mouth daily.  90 tablet  2  . pantoprazole (PROTONIX) 40 MG tablet Take 1 tablet (40 mg total) by mouth daily.  90 tablet  3  . traMADol (ULTRAM) 50 MG tablet Take 50 mg by mouth every 6 (six) hours as needed.       Marland Kitchen VITAMIN D, ERGOCALCIFEROL, PO Take by mouth daily.      . potassium chloride (K-DUR) 10 MEQ tablet Take 1 tablet (10 mEq total) by mouth daily.  30 tablet  6   No current facility-administered medications for this visit.     Past Medical History  Diagnosis Date  . CAD (coronary artery disease)    a. s/p remote MI 25  . Syncope   . TIA (transient ischemic attack)   . Zenker's diverticulum   . Hiatal hernia   . GERD (gastroesophageal reflux disease)   . Depression   . Nephrolithiasis   . Hyperlipidemia   . HTN (hypertension)   . Atrial fibrillation     a. Dx 04/2011    Past Surgical History  Procedure Laterality Date  . Laparoscopic cholecystectomy    . Implantation of a medtronic implantation loop recorder  03/01/2006    Dr. Ladona Ridgel    History   Social History  . Marital Status: Married    Spouse Name: N/A    Number of Children: 2  . Years of Education: N/A   Occupational History  . retired     Research officer, political party   Social History Main Topics  . Smoking status: Former Smoker    Quit date: 05/07/1961  . Smokeless tobacco: Not on file  . Alcohol Use: No  . Drug Use: No  . Sexually Active: Not on file   Other Topics Concern  . Not on file   Social History Narrative  . No narrative on file    ROS: no fevers or chills, productive cough, hemoptysis, dysphasia, odynophagia, melena, hematochezia, dysuria, hematuria, rash, seizure activity, orthopnea, PND, pedal edema, claudication.  Remaining systems are negative.  Physical Exam: Well-developed frail in no acute distress.  Skin is warm and dry.  HEENT is normal.  Neck is supple.  Chest is clear to auscultation with normal expansion. Loop recorder in left chest Cardiovascular exam is irregular Abdominal exam nontender or distended. No masses palpated. Extremities show no edema. neuro grossly intact  ECG atrial fibrillation at a rate of 60. Left axis deviation. Nonspecific ST changes to

## 2012-06-30 NOTE — Assessment & Plan Note (Addendum)
Continue statin. Check lipids and liver. 

## 2012-06-30 NOTE — Assessment & Plan Note (Signed)
Patient remains in atrial fibrillation.continue Plavix. Not a Coumadin candidate given recurrent falls. Continue Toprol.

## 2012-06-30 NOTE — Patient Instructions (Addendum)
Your physician wants you to follow-up in: 6 MONTHS WITH DR Jens Som You will receive a reminder letter in the mail two months in advance. If you don't receive a letter, please call our office to schedule the follow-up appointment.   Your physician recommends that you return for lab work in: WHEN FASTING

## 2012-06-30 NOTE — Assessment & Plan Note (Addendum)
Blood pressure controlled. Continue present medications. Check potassium and renal function. 

## 2012-06-30 NOTE — Assessment & Plan Note (Signed)
-   Continue Plavix and statin 

## 2012-07-01 ENCOUNTER — Other Ambulatory Visit (INDEPENDENT_AMBULATORY_CARE_PROVIDER_SITE_OTHER): Payer: Medicare Other

## 2012-07-01 DIAGNOSIS — I10 Essential (primary) hypertension: Secondary | ICD-10-CM

## 2012-07-01 DIAGNOSIS — I059 Rheumatic mitral valve disease, unspecified: Secondary | ICD-10-CM

## 2012-07-01 DIAGNOSIS — I340 Nonrheumatic mitral (valve) insufficiency: Secondary | ICD-10-CM

## 2012-07-01 DIAGNOSIS — E785 Hyperlipidemia, unspecified: Secondary | ICD-10-CM

## 2012-07-01 DIAGNOSIS — I4891 Unspecified atrial fibrillation: Secondary | ICD-10-CM

## 2012-07-01 DIAGNOSIS — I6529 Occlusion and stenosis of unspecified carotid artery: Secondary | ICD-10-CM

## 2012-07-01 DIAGNOSIS — I2510 Atherosclerotic heart disease of native coronary artery without angina pectoris: Secondary | ICD-10-CM

## 2012-07-01 LAB — BASIC METABOLIC PANEL
CO2: 30 mEq/L (ref 19–32)
Chloride: 107 mEq/L (ref 96–112)
GFR: 85.89 mL/min (ref >60.00)
Glucose, Bld: 94 mg/dL (ref 70–99)
Potassium: 3.9 mEq/L (ref 3.5–5.1)
Sodium: 143 mEq/L (ref 135–145)

## 2012-07-01 LAB — LIPID PANEL
HDL: 35 mg/dL — ABNORMAL LOW (ref >39.00)
LDL Cholesterol: 61 mg/dL (ref 0–99)
Total CHOL/HDL Ratio: 3
VLDL: 13 mg/dL (ref 0.0–40.0)

## 2012-07-01 LAB — HEPATIC FUNCTION PANEL: Total Bilirubin: 0.7 mg/dL (ref 0.3–1.2)

## 2012-07-04 ENCOUNTER — Encounter: Payer: Self-pay | Admitting: *Deleted

## 2012-10-31 ENCOUNTER — Telehealth: Payer: Self-pay | Admitting: Internal Medicine

## 2012-10-31 NOTE — Telephone Encounter (Signed)
Pt is having problems swallowing. Pts daughter wants to know if he can have another dilation. If so they will be happy to bring him in for an OV, she wanted to check since he is 79 years old. Please advise.

## 2012-10-31 NOTE — Telephone Encounter (Signed)
He needs to be seen in office to decide if EGD/ dilation is appropriate.

## 2012-11-01 ENCOUNTER — Encounter: Payer: Self-pay | Admitting: *Deleted

## 2012-11-01 NOTE — Telephone Encounter (Signed)
Pt scheduled to see Doug Sou PA Friday at 3:30pm while Dr. Marina Goodell is the supervising physician. Daughter aware of appt.

## 2012-11-02 ENCOUNTER — Encounter: Payer: Self-pay | Admitting: Gastroenterology

## 2012-11-02 ENCOUNTER — Ambulatory Visit (INDEPENDENT_AMBULATORY_CARE_PROVIDER_SITE_OTHER): Payer: Medicare Other | Admitting: Gastroenterology

## 2012-11-02 VITALS — BP 110/60 | HR 58 | Ht 68.0 in | Wt 118.0 lb

## 2012-11-02 DIAGNOSIS — R1319 Other dysphagia: Secondary | ICD-10-CM

## 2012-11-02 NOTE — Patient Instructions (Signed)
You have been scheduled for a Barium Esophogram at [facility] (1st floor of the hospital) on 11/04/12 at 9:30am. Please arrive 15 minutes prior to your appointment for registration. Make certain not to have anything to eat or drink 6 hours prior to your test. If you need to reschedule for any reason, please contact radiology at [phone] to do so. __________________________________________________________________ A barium swallow is an examination that concentrates on views of the esophagus. This tends to be a double contrast exam (barium and two liquids which, when combined, create a gas to distend the wall of the oesophagus) or single contrast (non-ionic iodine based). The study is usually tailored to your symptoms so a good history is essential. Attention is paid during the study to the form, structure and configuration of the esophagus, looking for functional disorders (such as aspiration, dysphagia, achalasia, motility and reflux) EXAMINATION You may be asked to change into a gown, depending on the type of swallow being performed. A radiologist and radiographer will perform the procedure. The radiologist will advise you of the type of contrast selected for your procedure and direct you during the exam. You will be asked to stand, sit or lie in several different positions and to hold a small amount of fluid in your mouth before being asked to swallow while the imaging is performed .In some instances you may be asked to swallow barium coated marshmallows to assess the motility of a solid food bolus. The exam can be recorded as a digital or video fluoroscopy procedure. POST PROCEDURE It will take 1-2 days for the barium to pass through your system. To facilitate this, it is important, unless otherwise directed, to increase your fluids for the next 24-48hrs and to resume your normal diet.  This test typically takes about 30 minutes to  perform. __________________________________________________________________________________

## 2012-11-02 NOTE — Progress Notes (Signed)
Agree with esophagram as next at. Would not want to proceed with therapeutic endoscopy unless absolutely necessary

## 2012-11-02 NOTE — Progress Notes (Signed)
11/02/2012 Nathan Mills [id number] 1917-10-17   HISTORY OF PRESENT ILLNESS:  This is a pleasant 79 year old male who is a patient of Dr. Lamar Sprinkles. He has multiple significant medical problems including coronary artery disease, hypertension, cerebrovascular disease with recurrent stroke, trigeminal neuralgia, dyslipidemia, and COPD.  He is on Plavix.  He is here today with his daughter to discuss his recurrent dysphagia. He had similar complaints in the past. In January of 2011 he underwent EGD, which revealed a stricture in the distal esophagus, which was dilated. He was also found to have pedunculated polyps in the fundus/body of the stomach as well as a hiatal hernia and a Zenker's diverticulum. His dilation at that time was performed while on aspirin and Plavix. He seemed to only have relief from the dilation for a very short period of time. He returned complaining of recurrent dysphasia in 07/2009, and he underwent an esophagram which revealed a Zenker's diverticulum in the upper esophagus with reflux of contrast material from the diverticulum into the oral cavity. He was also found to have mild esophageal dysmotility.  He comes in today again with the same complaints. Describes dysphagia to solid food and pills and points to his cervical esophagus. He has lost a lot of weight and is down to 118 pounds.    Past Medical History  Diagnosis Date  . CAD (coronary artery disease)     a. s/p remote MI 100  . Syncope   . TIA (transient ischemic attack)   . Zenker's diverticulum   . Hiatal hernia   . GERD (gastroesophageal reflux disease)   . Depression   . Nephrolithiasis   . Hyperlipidemia   . HTN (hypertension)   . Atrial fibrillation     a. Dx 04/2011  . Gallstones   . COPD (chronic obstructive pulmonary disease)   . Peptic stricture of esophagus   . CVA (cerebral infarction)   . Trigeminal neuralgia   . Esophageal dysmotility   . Gastric polyp    Past Surgical History   Procedure Laterality Date  . Laparoscopic cholecystectomy    . Implantation of a medtronic implantation loop recorder  03/01/2006    Dr. Ladona Ridgel    reports that he quit smoking about 51 years ago. He has never used smokeless tobacco. He reports that he does not drink alcohol or use illicit drugs. family history includes Cancer in his daughter; Coronary artery disease in an other family member. No Known Allergies    Outpatient Encounter Prescriptions as of 11/02/2012  Medication Sig Dispense Refill  . ALPRAZolam (XANAX) 0.5 MG tablet Take 0.5 mg by mouth daily.      Marland Kitchen amLODipine-atorvastatin (CADUET) 5-20 MG per tablet TAKE 1 TABLET DAILY  90 tablet  3  . clopidogrel (PLAVIX) 75 MG tablet Take 1 tablet (75 mg total) by mouth daily.  90 tablet  3  . Cyanocobalamin (VITAMIN B12 PO) Take by mouth daily.      . DiphenhydrAMINE HCl (BENADRYL PO) Take by mouth at bedtime.      Marland Kitchen escitalopram (LEXAPRO) 10 MG tablet Take 10 mg by mouth daily.      Marland Kitchen gabapentin (NEURONTIN) 100 MG capsule as needed.      Marland Kitchen GARLIC PO Take by mouth.      . metoprolol succinate (TOPROL-XL) 25 MG 24 hr tablet Take 1 tablet (25 mg total) by mouth daily.  90 tablet  2  . pantoprazole (PROTONIX) 40 MG tablet Take 1 tablet (40 mg total) by mouth daily.  90 tablet  3  . potassium chloride (K-DUR) 10 MEQ tablet Take 1 tablet (10 mEq total) by mouth daily.  30 tablet  6  . traMADol (ULTRAM) 50 MG tablet Take 50 mg by mouth every 6 (six) hours as needed.       Marland Kitchen VITAMIN D, ERGOCALCIFEROL, PO Take by mouth daily.       No facility-administered encounter medications on file as of 11/02/2012.     REVIEW OF SYSTEMS  : All other systems reviewed and negative except where noted in the History of Present Illness.   PHYSICAL EXAM: BP 110/60  Pulse 58  Ht 5\' 8"  (1.727 m)  Wt 118 lb (53.524 kg)  BMI 17.95 kg/m2 General:  Frail elderly white male in no acute distress Head: Normocephalic and atraumatic Eyes:  Sclerae  anicteric, conjunctiva pink. Ears: Normal auditory acuity Lungs: Clear throughout to auscultation Heart:  Irregular Abdomen: Soft, non-tender, non-distended.  Normal bowel sounds. Musculoskeletal: Symmetrical with no gross deformities  Skin: No lesions on visible extremities Extremities: No edema  Neurological: Alert oriented x 4, grossly nonfocal Psychological:  Alert and cooperative. Normal mood and affect  ASSESSMENT AND PLAN: -Dysphagia to pills and solid food:  Patient has several reasons that could account for his dysphasia. He has a Zenker's diverticulum as well as esophageal dysmotility. He did have an esophageal stricture on esophagram in 2011. We will evaluate with a repeat barium esophagram to determine if there is a recurrent stricture or other cause amenable to EGD prior to scheduling for a procedure due to his advanced age. Advised him to began drinking Boost or Ensure shakes for calories and nutrition.

## 2012-11-04 ENCOUNTER — Ambulatory Visit (HOSPITAL_COMMUNITY)
Admission: RE | Admit: 2012-11-04 | Discharge: 2012-11-04 | Disposition: A | Discharge status: Home / Self Care | Payer: Medicare Other | Source: Ambulatory Visit | Attending: Gastroenterology | Admitting: Gastroenterology

## 2012-11-04 ENCOUNTER — Ambulatory Visit: Payer: Medicare Other | Admitting: Gastroenterology

## 2012-11-04 DIAGNOSIS — R1319 Other dysphagia: Secondary | ICD-10-CM

## 2012-11-04 DIAGNOSIS — R4181 Age-related cognitive decline: Secondary | ICD-10-CM | POA: Insufficient documentation

## 2012-11-04 DIAGNOSIS — K449 Diaphragmatic hernia without obstruction or gangrene: Secondary | ICD-10-CM | POA: Insufficient documentation

## 2012-11-04 DIAGNOSIS — K224 Dyskinesia of esophagus: Secondary | ICD-10-CM | POA: Insufficient documentation

## 2012-11-04 DIAGNOSIS — K225 Diverticulum of esophagus, acquired: Secondary | ICD-10-CM | POA: Insufficient documentation

## 2012-11-04 DIAGNOSIS — R059 Cough, unspecified: Secondary | ICD-10-CM | POA: Insufficient documentation

## 2012-11-04 DIAGNOSIS — R05 Cough: Secondary | ICD-10-CM | POA: Insufficient documentation

## 2012-11-07 ENCOUNTER — Telehealth: Payer: Self-pay | Admitting: Internal Medicine

## 2012-11-07 NOTE — Telephone Encounter (Signed)
Thanks Jay!

## 2012-11-07 NOTE — Telephone Encounter (Signed)
Pt had barium esophagram and is having difficulty swallowing due to his Zenker's diverticulum. Pt saw Doug Sou PA and family would like him seen at [facility] to see if he is a candidate for endoscopic repair for this. Dr. Rhea Belton which doctor at [facility] would be appropriate to send this pt to for an appt at [facility]. Please advise.

## 2012-11-07 NOTE — Telephone Encounter (Signed)
I was asked for advice regarding [facility] for possible repair of Zenker's Dr. Wyline Mood, at [facility] plans to see the patient in clinic to discuss possible endoscopic repair. I passed the patient's demographics to [facility], and they will contact the patient with this referral. The pt should call us if they have not heard from [facility] soon.

## 2012-11-08 ENCOUNTER — Telehealth: Payer: Self-pay

## 2012-11-08 NOTE — Telephone Encounter (Signed)
Message copied by Chrystie Nose on Tue Nov 08, 2012  9:01 AM ------      Message from: Beverley Fiedler      Created: Mon Nov 07, 2012  5:34 PM      Regarding: Cards       [facility] asked for cardiology notes (he is on plavix) and our GI notes be faxed to them before his appt with Dr. Caralyn Guile.      Thanks      Vonna Kotyk            Notes can be faxed to the below MD or Dr. Maryjo Rochester, MD      Assistant Professor of Medicine, Division of Gastroenterology      Director of Advanced Endoscopy      Associate Director of Clinical Endoscopy             [facility]      [address]      [place], [place] [address] ------

## 2012-11-08 NOTE — Telephone Encounter (Signed)
Received the following email from [facility]:  "Spoke with pt's daughter Juliette Alcide) and she has confirmed the appts as follows:   Consult with Dr. Wyline Mood 11/10 at 9am (aware this is an overbook and may have a wait. Advised to arrive at 8:45am) PAT to follow consult at 11am if it is decided to proceed with endoscopy. EGD(tentatively) scheduled  with Dr. Wyline Mood 11/24 at 9am arriving at 8am Daughter is aware to discuss blood thinners at consult and with prescribing provider. Daughter is aware that pat and Procedure are tentatively schedule based on outcome of consult.   CK     Katheran James, BSN RN Biliary Resource Nurse [facility] [address] [place], [place] [address] [phone] phone [phone] fax  "

## 2012-11-08 NOTE — Telephone Encounter (Signed)
Office phone number for Dr. Wyline Mood is [phone] Fax number is [phone]  Records faxed to [facility].

## 2012-11-08 NOTE — Telephone Encounter (Signed)
Pts daughter aware and given the office phone number to contact. Records have been faxed.

## 2012-12-22 ENCOUNTER — Encounter: Payer: Self-pay | Admitting: Cardiology

## 2013-01-03 ENCOUNTER — Other Ambulatory Visit: Payer: Self-pay | Admitting: Cardiology

## 2013-01-06 ENCOUNTER — Ambulatory Visit: Payer: Medicare Other | Admitting: Cardiology

## 2013-01-11 ENCOUNTER — Ambulatory Visit (HOSPITAL_COMMUNITY): Payer: Medicare Other | Attending: Cardiology

## 2013-01-11 ENCOUNTER — Encounter: Payer: Self-pay | Admitting: Cardiology

## 2013-01-11 DIAGNOSIS — I6529 Occlusion and stenosis of unspecified carotid artery: Secondary | ICD-10-CM | POA: Insufficient documentation

## 2013-01-11 DIAGNOSIS — I658 Occlusion and stenosis of other precerebral arteries: Secondary | ICD-10-CM | POA: Insufficient documentation

## 2013-01-11 DIAGNOSIS — I10 Essential (primary) hypertension: Secondary | ICD-10-CM | POA: Insufficient documentation

## 2013-01-11 DIAGNOSIS — Z8673 Personal history of transient ischemic attack (TIA), and cerebral infarction without residual deficits: Secondary | ICD-10-CM | POA: Insufficient documentation

## 2013-01-11 DIAGNOSIS — Z87891 Personal history of nicotine dependence: Secondary | ICD-10-CM | POA: Insufficient documentation

## 2013-01-11 DIAGNOSIS — I2510 Atherosclerotic heart disease of native coronary artery without angina pectoris: Secondary | ICD-10-CM | POA: Insufficient documentation

## 2013-01-11 DIAGNOSIS — E785 Hyperlipidemia, unspecified: Secondary | ICD-10-CM | POA: Insufficient documentation

## 2013-01-20 ENCOUNTER — Ambulatory Visit: Payer: Medicare Other | Admitting: Cardiology

## 2013-02-02 ENCOUNTER — Ambulatory Visit (INDEPENDENT_AMBULATORY_CARE_PROVIDER_SITE_OTHER): Payer: Medicare Other | Admitting: Cardiology

## 2013-02-02 ENCOUNTER — Encounter: Payer: Self-pay | Admitting: Cardiology

## 2013-02-02 VITALS — BP 122/64 | HR 64 | Wt 116.0 lb

## 2013-02-02 DIAGNOSIS — I10 Essential (primary) hypertension: Secondary | ICD-10-CM

## 2013-02-02 DIAGNOSIS — I340 Nonrheumatic mitral (valve) insufficiency: Secondary | ICD-10-CM

## 2013-02-02 DIAGNOSIS — I6529 Occlusion and stenosis of unspecified carotid artery: Secondary | ICD-10-CM

## 2013-02-02 DIAGNOSIS — I059 Rheumatic mitral valve disease, unspecified: Secondary | ICD-10-CM

## 2013-02-02 DIAGNOSIS — I4891 Unspecified atrial fibrillation: Secondary | ICD-10-CM

## 2013-02-02 DIAGNOSIS — E785 Hyperlipidemia, unspecified: Secondary | ICD-10-CM

## 2013-02-02 DIAGNOSIS — I2510 Atherosclerotic heart disease of native coronary artery without angina pectoris: Secondary | ICD-10-CM

## 2013-02-02 NOTE — Progress Notes (Signed)
HPI: FU atrial fibrillation, coronary disease and cerebrovascular disease. The patient has a history of remote myocardial infarction in 1982. Carotid Dopplers in December of 2014 showed 40-59% bilateral stenosis and followup recommended in one year. Echocardiogram in May of 2013 showed an ejection fraction of 55%, moderate to severe mitral regurgitation and moderate biatrial enlargement. There was a small pericardial effusion. The patient is felt not to be a Coumadin candidate because of recurrent falls. Last seen in June of 2014. Since then, patient has some DOE but no chest pain, palpitations or syncope. He continues to fall occasionally.   Current Outpatient Prescriptions  Medication Sig Dispense Refill  . ALPRAZolam (XANAX) 0.5 MG tablet Take 0.5 mg by mouth daily.      Marland Kitchen amLODipine-atorvastatin (CADUET) 5-20 MG per tablet TAKE 1 TABLET DAILY  90 tablet  3  . clopidogrel (PLAVIX) 75 MG tablet Take 1 tablet (75 mg total) by mouth daily.  90 tablet  3  . Cyanocobalamin (VITAMIN B12 PO) Take by mouth daily.      . DiphenhydrAMINE HCl (BENADRYL PO) Take by mouth at bedtime.      Marland Kitchen escitalopram (LEXAPRO) 10 MG tablet Take 10 mg by mouth daily.      . finasteride (PROSCAR) 5 MG tablet Take 1 tablet by mouth daily.      Marland Kitchen gabapentin (NEURONTIN) 100 MG capsule as needed.      Marland Kitchen GARLIC PO Take by mouth.      . metoprolol succinate (TOPROL-XL) 25 MG 24 hr tablet Take one tablet by mouth one time daily  90 tablet  0  . pantoprazole (PROTONIX) 40 MG tablet Take 1 tablet (40 mg total) by mouth daily.  90 tablet  3  . potassium chloride (K-DUR) 10 MEQ tablet Take 1 tablet (10 mEq total) by mouth daily.  30 tablet  6  . traMADol (ULTRAM) 50 MG tablet Take 50 mg by mouth every 6 (six) hours as needed.       Marland Kitchen VITAMIN D, ERGOCALCIFEROL, PO Take by mouth daily.       No current facility-administered medications for this visit.     Past Medical History  Diagnosis Date  . CAD (coronary artery  disease)     a. s/p remote MI 45  . Syncope   . TIA (transient ischemic attack)   . Zenker's diverticulum   . Hiatal hernia   . GERD (gastroesophageal reflux disease)   . Depression   . Nephrolithiasis   . Hyperlipidemia   . HTN (hypertension)   . Atrial fibrillation     a. Dx 04/2011  . Gallstones   . COPD (chronic obstructive pulmonary disease)   . Peptic stricture of esophagus   . CVA (cerebral infarction)   . Trigeminal neuralgia   . Esophageal dysmotility   . Gastric polyp     Past Surgical History  Procedure Laterality Date  . Laparoscopic cholecystectomy    . Implantation of a medtronic implantation loop recorder  03/01/2006    Dr. Ladona Ridgel    History   Social History  . Marital Status: Married    Spouse Name: N/A    Number of Children: 2  . Years of Education: N/A   Occupational History  . retired     Research officer, political party   Social History Main Topics  . Smoking status: Former Smoker    Quit date: 05/07/1961  . Smokeless tobacco: Never Used  . Alcohol Use: No  . Drug Use:  No  . Sexual Activity: Not on file   Other Topics Concern  . Not on file   Social History Narrative  . No narrative on file    ROS: no fevers or chills, productive cough, hemoptysis, dysphasia, odynophagia, melena, hematochezia, dysuria, hematuria, rash, seizure activity, orthopnea, PND, pedal edema, claudication. Remaining systems are negative.  Physical Exam: Well-developed frail in no acute distress.  Skin is warm and dry.  HEENT is normal.  Neck is supple.  Chest is clear to auscultation with normal expansion.  Cardiovascular exam is irregular Abdominal exam nontender or distended. No masses palpated. Extremities show no edema. neuro grossly intact  ECG atrial fibrillation at a rate of 64. Left anterior fascicular block. Poor R wave progression cannot rule out prior infarct. Nonspecific ST changes.

## 2013-02-02 NOTE — Assessment & Plan Note (Signed)
-   Continue Plavix and statin 

## 2013-02-02 NOTE — Assessment & Plan Note (Signed)
Continue statin. 

## 2013-02-02 NOTE — Assessment & Plan Note (Signed)
Blood pressure controlled. Continue present medications. 

## 2013-02-02 NOTE — Assessment & Plan Note (Signed)
Conservative measures given age. 

## 2013-02-02 NOTE — Patient Instructions (Signed)
Your physician wants you to follow-up in: 6 MONTHS WITH DR CRENSHAW You will receive a reminder letter in the mail two months in advance. If you don't receive a letter, please call our office to schedule the follow-up appointment.  

## 2013-02-02 NOTE — Assessment & Plan Note (Signed)
Continue beta blocker for rate control. Not on Coumadin given recurrent falls. Continue Plavix.

## 2013-02-22 ENCOUNTER — Telehealth: Payer: Self-pay | Admitting: Cardiology

## 2013-02-22 NOTE — Telephone Encounter (Signed)
New message    Last 3 visit heart rate between  43-51 . asymptotic also fall last week has open area in hand . Requesting order on xeroform twice a week .

## 2013-02-22 NOTE — Telephone Encounter (Signed)
Spoke with dawn, pt is asymptomatic with the heart rate. ? If need to decrease metoprolol. The pts wife reports he has weak spells. Will make dr Jens Somcrenshaw aware. She will contact the primary care regarding the hand wound.

## 2013-02-23 NOTE — Telephone Encounter (Signed)
Dc toprol and follow hr Olga MillersBrian Denijah Karrer

## 2013-02-23 NOTE — Telephone Encounter (Signed)
Spoke with DAWN, Aware of dr Ludwig Clarkscrenshaw's recommendations.

## 2013-03-12 ENCOUNTER — Encounter (HOSPITAL_COMMUNITY): Payer: Self-pay | Admitting: Emergency Medicine

## 2013-03-12 ENCOUNTER — Emergency Department (HOSPITAL_COMMUNITY): Payer: Medicare Other

## 2013-03-12 ENCOUNTER — Emergency Department (HOSPITAL_COMMUNITY)
Admission: EM | Admit: 2013-03-12 | Discharge: 2013-03-12 | Disposition: A | Discharge status: Home / Self Care | Payer: Medicare Other | Attending: Emergency Medicine | Admitting: Emergency Medicine

## 2013-03-12 DIAGNOSIS — W19XXXA Unspecified fall, initial encounter: Secondary | ICD-10-CM

## 2013-03-12 DIAGNOSIS — F3289 Other specified depressive episodes: Secondary | ICD-10-CM | POA: Insufficient documentation

## 2013-03-12 DIAGNOSIS — Z7902 Long term (current) use of antithrombotics/antiplatelets: Secondary | ICD-10-CM | POA: Insufficient documentation

## 2013-03-12 DIAGNOSIS — E785 Hyperlipidemia, unspecified: Secondary | ICD-10-CM | POA: Insufficient documentation

## 2013-03-12 DIAGNOSIS — S0100XA Unspecified open wound of scalp, initial encounter: Secondary | ICD-10-CM | POA: Insufficient documentation

## 2013-03-12 DIAGNOSIS — K006 Disturbances in tooth eruption: Secondary | ICD-10-CM | POA: Insufficient documentation

## 2013-03-12 DIAGNOSIS — I10 Essential (primary) hypertension: Secondary | ICD-10-CM | POA: Insufficient documentation

## 2013-03-12 DIAGNOSIS — I2510 Atherosclerotic heart disease of native coronary artery without angina pectoris: Secondary | ICD-10-CM | POA: Insufficient documentation

## 2013-03-12 DIAGNOSIS — Z9181 History of falling: Secondary | ICD-10-CM | POA: Insufficient documentation

## 2013-03-12 DIAGNOSIS — Z87442 Personal history of urinary calculi: Secondary | ICD-10-CM | POA: Insufficient documentation

## 2013-03-12 DIAGNOSIS — F329 Major depressive disorder, single episode, unspecified: Secondary | ICD-10-CM | POA: Insufficient documentation

## 2013-03-12 DIAGNOSIS — Y939 Activity, unspecified: Secondary | ICD-10-CM | POA: Insufficient documentation

## 2013-03-12 DIAGNOSIS — G319 Degenerative disease of nervous system, unspecified: Secondary | ICD-10-CM | POA: Insufficient documentation

## 2013-03-12 DIAGNOSIS — S0101XA Laceration without foreign body of scalp, initial encounter: Secondary | ICD-10-CM

## 2013-03-12 DIAGNOSIS — Y929 Unspecified place or not applicable: Secondary | ICD-10-CM | POA: Insufficient documentation

## 2013-03-12 DIAGNOSIS — W1809XA Striking against other object with subsequent fall, initial encounter: Secondary | ICD-10-CM | POA: Insufficient documentation

## 2013-03-12 DIAGNOSIS — I4891 Unspecified atrial fibrillation: Secondary | ICD-10-CM | POA: Insufficient documentation

## 2013-03-12 DIAGNOSIS — Z8669 Personal history of other diseases of the nervous system and sense organs: Secondary | ICD-10-CM | POA: Insufficient documentation

## 2013-03-12 DIAGNOSIS — I252 Old myocardial infarction: Secondary | ICD-10-CM | POA: Insufficient documentation

## 2013-03-12 DIAGNOSIS — R5383 Other fatigue: Secondary | ICD-10-CM

## 2013-03-12 DIAGNOSIS — Z8673 Personal history of transient ischemic attack (TIA), and cerebral infarction without residual deficits: Secondary | ICD-10-CM | POA: Insufficient documentation

## 2013-03-12 DIAGNOSIS — J4489 Other specified chronic obstructive pulmonary disease: Secondary | ICD-10-CM | POA: Insufficient documentation

## 2013-03-12 DIAGNOSIS — J449 Chronic obstructive pulmonary disease, unspecified: Secondary | ICD-10-CM | POA: Insufficient documentation

## 2013-03-12 DIAGNOSIS — Z87891 Personal history of nicotine dependence: Secondary | ICD-10-CM | POA: Insufficient documentation

## 2013-03-12 DIAGNOSIS — R5381 Other malaise: Secondary | ICD-10-CM | POA: Insufficient documentation

## 2013-03-12 DIAGNOSIS — K219 Gastro-esophageal reflux disease without esophagitis: Secondary | ICD-10-CM | POA: Insufficient documentation

## 2013-03-12 DIAGNOSIS — Z79899 Other long term (current) drug therapy: Secondary | ICD-10-CM | POA: Insufficient documentation

## 2013-03-12 MED ORDER — "THROMBI-PAD 3""X3"" EX PADS"
1.0000 | MEDICATED_PAD | Freq: Once | CUTANEOUS | Status: AC
  Administered 2013-03-12: 1 via TOPICAL
  Filled 2013-03-12: qty 1

## 2013-03-12 NOTE — ED Notes (Signed)
Patient taken to CT.

## 2013-03-12 NOTE — Progress Notes (Signed)
Met Patient / wife at bedside.Role of CM explained.Today's ED visit is secondary to the patient falling at home and sustaining a head laceration.  Patients wife reports they have Home health with [facility] services-[phone].They have wound care services.Family and patient report  In view of this fall they would like PT and Nurse aid services and wound care for the new head laceration.CM clarified Patient/ family request  And will contact [facility] and liaise with Dr Vanita Panda.

## 2013-03-12 NOTE — ED Notes (Signed)
Dressing changed again and thromipad placed to area. Dr. Jeraldine LootsLockwood at the bedside. Pt alert and oriented. Denies any pain at this time. Family remains at the bedside.

## 2013-03-12 NOTE — Discharge Instructions (Signed)
Please monitor your head wound.  Return here if blood soaked through your dressing.  You should also return here for any concerning changes in your condition.  Otherwise, please follow up with your primary care physician in one week for staple removal.

## 2013-03-12 NOTE — ED Provider Notes (Addendum)
CSN: [id number]     Arrival date & time 03/12/13  [id number] History   First MD Initiated Contact with Patient 03/12/13 [phone]     Chief Complaint  Patient presents with  . Fall    HPI  Patient presents after a mechanical fall with head laceration.  Patient's frequent falls.  Patient recalls the entirety of the event.  The event occurred about 6 hours prior to my evaluation.  Following the event the patient went back to sleep.  He woke with significant amounts of blood around his head. No persistent confusion, disorientation, head pain.  No visual loss, no syncope.   Past Medical History  Diagnosis Date  . CAD (coronary artery disease)     a. s/p remote MI 641982  . Syncope   . TIA (transient ischemic attack)   . Zenker's diverticulum   . Hiatal hernia   . GERD (gastroesophageal reflux disease)   . Depression   . Nephrolithiasis   . Hyperlipidemia   . HTN (hypertension)   . Atrial fibrillation     a. Dx 04/2011  . Gallstones   . COPD (chronic obstructive pulmonary disease)   . Peptic stricture of esophagus   . CVA (cerebral infarction)   . Trigeminal neuralgia   . Esophageal dysmotility   . Gastric polyp    Past Surgical History  Procedure Laterality Date  . Laparoscopic cholecystectomy    . Implantation of a medtronic implantation loop recorder  03/01/2006    Dr. Ladona Ridgelaylor   Family History  Problem Relation Age of Onset  . Coronary artery disease    . Cancer Daughter     eye cancer   History  Substance Use Topics  . Smoking status: Former Smoker    Quit date: 05/07/1961  . Smokeless tobacco: Never Used  . Alcohol Use: No    Review of Systems  Constitutional:       Per HPI, otherwise negative  HENT:       Per HPI, otherwise negative  Respiratory:       Per HPI, otherwise negative  Cardiovascular:       Per HPI, otherwise negative  Gastrointestinal: Negative for vomiting.  Endocrine:       Negative aside from HPI  Genitourinary:       Neg aside from HPI    Musculoskeletal:       Per HPI, otherwise negative  Skin: Negative.   Neurological: Positive for weakness. Negative for seizures and syncope.       Instability of gait      Allergies  Review of patient's allergies indicates no known allergies.  Home Medications   Current Outpatient Rx  Name  Route  Sig  Dispense  Refill  . amLODipine-atorvastatin (CADUET) 5-20 MG per tablet   Oral   Take 1 tablet by mouth daily.         . clopidogrel (PLAVIX) 75 MG tablet   Oral   Take 1 tablet (75 mg total) by mouth daily.   90 tablet   3   . ERGOCALCIFEROL PO   Oral   Take 1 capsule by mouth once a week.         . escitalopram (LEXAPRO) 10 MG tablet   Oral   Take 10 mg by mouth daily.         Marland Kitchen. omeprazole (PRILOSEC) 10 MG capsule   Oral   Take 10 mg by mouth daily.         . Cyanocobalamin (VITAMIN  B12 PO)   Oral   Take 1 tablet by mouth daily.           BP 184/80  Pulse 71  Temp(Src) 98.1 F (36.7 C) (Oral)  Resp 16  Ht 5\' 8"  (1.727 m)  Wt 118 lb (53.524 kg)  BMI 17.95 kg/m2  SpO2 97% Physical Exam  Nursing note and vitals reviewed. Constitutional: He is oriented to person, place, and time. He appears well-developed. No distress.  HENT:  Head: Normocephalic. Head is with laceration.  Patient has very poor dentition, though reports no new fractures or lesions.  Dried blood about the face and scalp  On the parietal right there is a proximally 7 cm total length stellate laceration  Eyes: Conjunctivae and EOM are normal.  Cardiovascular: Normal rate and regular rhythm.   Pulmonary/Chest: Effort normal. No stridor. No respiratory distress.  Abdominal: He exhibits no distension.  Musculoskeletal: He exhibits no edema.  Neurological: He is alert and oriented to person, place, and time. He displays atrophy. He displays no tremor. No cranial nerve deficit or sensory deficit. He exhibits normal muscle tone. He displays no seizure activity. Coordination normal.   Patient moves all extremities spontaneously, has appropriate strength throughout.  No facial asymmetry, speech difficulty  Skin: Skin is warm and dry.  Psychiatric: He has a normal mood and affect.    ED Course  Procedures (including critical care time) Labs Review Labs Reviewed - No data to display Imaging Review Ct Head Wo Contrast  03/12/2013   CLINICAL DATA:  Fall, right side laceration  EXAM: CT HEAD WITHOUT CONTRAST  CT CERVICAL SPINE WITHOUT CONTRAST  TECHNIQUE: Multidetector CT imaging of the head and cervical spine was performed following the standard protocol without intravenous contrast. Multiplanar CT image reconstructions of the cervical spine were also generated.  COMPARISON:  09/16/2009  FINDINGS: CT HEAD FINDINGS  No skull fracture is noted. There is scalp swelling in right parietal region high convexity. Probable laceration in right posterior parietal scalp axial image 30.  No intracranial hemorrhage, mass effect or midline shift. Stable cerebral atrophy. Again noted periventricular and patchy subcortical chronic white matter disease.  No acute cortical infarction. No mass lesion is noted on this unenhanced scan.  CT CERVICAL SPINE FINDINGS  Axial images of the cervical spine shows no acute fracture or subluxation. There is bilateral apical scarring right greater than left. No pneumothorax noted in visualized lung apices.  Computer processed images shows no acute fracture or subluxation. Extensive degenerative changes are noted C1-C2 articulation. There is significant disc space flattening with mild anterior and mild posterior spurring at C5-C6 level. Mild disc space flattening with mild anterior and posterior spurring at C6-C7 level. No prevertebral soft tissue swelling. Cervical airway is patent. Mild posterior spurring at C4-C5 level.  IMPRESSION: 1. No acute intracranial abnormality. Stable atrophy and chronic white matter disease. There is scalp swelling in right parietal region.  Probable scalp laceration right posterior parietal region. 2. No cervical spine acute fracture or subluxation. Degenerative changes as described above.   Electronically Signed   By: Natasha Mead M.D.   On: 03/12/2013 11:10   Ct Cervical Spine Wo Contrast  03/12/2013   CLINICAL DATA:  Fall, right side laceration  EXAM: CT HEAD WITHOUT CONTRAST  CT CERVICAL SPINE WITHOUT CONTRAST  TECHNIQUE: Multidetector CT imaging of the head and cervical spine was performed following the standard protocol without intravenous contrast. Multiplanar CT image reconstructions of the cervical spine were also generated.  COMPARISON:  09/16/2009  FINDINGS: CT HEAD FINDINGS  No skull fracture is noted. There is scalp swelling in right parietal region high convexity. Probable laceration in right posterior parietal scalp axial image 30.  No intracranial hemorrhage, mass effect or midline shift. Stable cerebral atrophy. Again noted periventricular and patchy subcortical chronic white matter disease.  No acute cortical infarction. No mass lesion is noted on this unenhanced scan.  CT CERVICAL SPINE FINDINGS  Axial images of the cervical spine shows no acute fracture or subluxation. There is bilateral apical scarring right greater than left. No pneumothorax noted in visualized lung apices.  Computer processed images shows no acute fracture or subluxation. Extensive degenerative changes are noted C1-C2 articulation. There is significant disc space flattening with mild anterior and mild posterior spurring at C5-C6 level. Mild disc space flattening with mild anterior and posterior spurring at C6-C7 level. No prevertebral soft tissue swelling. Cervical airway is patent. Mild posterior spurring at C4-C5 level.  IMPRESSION: 1. No acute intracranial abnormality. Stable atrophy and chronic white matter disease. There is scalp swelling in right parietal region. Probable scalp laceration right posterior parietal region. 2. No cervical spine acute fracture  or subluxation. Degenerative changes as described above.   Electronically Signed   By: Natasha Mead M.D.   On: 03/12/2013 11:10     LACERATION REPAIR Performed by: Gerhard Munch Authorized by: Gerhard Munch Consent: Verbal consent obtained. Risks and benefits: risks, benefits and alternatives were discussed Consent given by: patient Patient identity confirmed: provided demographic data Prepped and Draped in normal sterile fashion Wound explored  Laceration Location: scalp  Laceration Length: 7cm  No Foreign Bodies seen or palpated  Irrigation method: syringe and bucket Amount of cleaning: copious  Skin closure: staples  Number of sutures: 5  Technique: rough - as much as possible the wound edges were approximated.  Patient tolerance: Patient tolerated the procedure well with no immediate complications.  MDM   This elderly male with frequent falls presents after fall.  Patient is on Plavix.  Patient's exam is largely reassuring, though with the significant bleeding, his history, radiographic imaging was performed. No evidence of fracture or hemorrhage.  Patient's wound was stapled.  Patient required monitoring after pressure was applied due to ongoing bleeding.  Patient initially was stable for discharge. Discussed patient's case with our care management team.  Additional home health care has been arranged for nursing and wound care    Gerhard Munch, MD 03/12/13 1332  Gerhard Munch, MD 03/12/13 1334

## 2013-03-12 NOTE — ED Notes (Signed)
Dr. Jeraldine LootsLockwood at the bedside. Cleaned wound and put a pressure dressing on it.

## 2013-03-12 NOTE — ED Notes (Signed)
Pt to department via EMS- pt reports that he fell about 6 hours ago and hit his head. Family reports that he got himself back into bed, but woke up because the area was bleeding. Family placed bandages to the forehead. Pt takes plavix. Bleeding controlled with EMS. Pt A&O per his baseline per family. BP-184/116 HR-80

## 2013-03-12 NOTE — ED Notes (Signed)
Head bandage saturated six inches in diameter with blood surrounding wound.

## 2013-03-12 NOTE — Progress Notes (Signed)
Home health orders faxed to [phone].Transmission log received that fax was sent.

## 2013-03-12 NOTE — ED Notes (Signed)
Dressing changed on patients head and EDP informed. Will continue to watch patient, given something to eat and drink.

## 2013-03-12 NOTE — Progress Notes (Signed)
CM called [facility] regarding new home services for patient( spoke with Cherly HensenBernadette on call RN.)

## 2013-03-13 ENCOUNTER — Inpatient Hospital Stay (HOSPITAL_COMMUNITY)
Admission: EM | Admit: 2013-03-13 | Discharge: 2013-03-17 | DRG: 605 | Disposition: A | Discharge status: Skilled Nursing Facility | Payer: Medicare Other | Attending: Internal Medicine | Admitting: Internal Medicine

## 2013-03-13 ENCOUNTER — Encounter (HOSPITAL_COMMUNITY): Payer: Self-pay | Admitting: Emergency Medicine

## 2013-03-13 DIAGNOSIS — K225 Diverticulum of esophagus, acquired: Secondary | ICD-10-CM

## 2013-03-13 DIAGNOSIS — W1800XA Striking against unspecified object with subsequent fall, initial encounter: Secondary | ICD-10-CM

## 2013-03-13 DIAGNOSIS — Z9181 History of falling: Secondary | ICD-10-CM

## 2013-03-13 DIAGNOSIS — D62 Acute posthemorrhagic anemia: Secondary | ICD-10-CM | POA: Diagnosis present

## 2013-03-13 DIAGNOSIS — N200 Calculus of kidney: Secondary | ICD-10-CM

## 2013-03-13 DIAGNOSIS — I252 Old myocardial infarction: Secondary | ICD-10-CM

## 2013-03-13 DIAGNOSIS — K219 Gastro-esophageal reflux disease without esophagitis: Secondary | ICD-10-CM | POA: Diagnosis present

## 2013-03-13 DIAGNOSIS — S0101XA Laceration without foreign body of scalp, initial encounter: Secondary | ICD-10-CM

## 2013-03-13 DIAGNOSIS — Z87891 Personal history of nicotine dependence: Secondary | ICD-10-CM

## 2013-03-13 DIAGNOSIS — I10 Essential (primary) hypertension: Secondary | ICD-10-CM | POA: Diagnosis present

## 2013-03-13 DIAGNOSIS — F329 Major depressive disorder, single episode, unspecified: Secondary | ICD-10-CM

## 2013-03-13 DIAGNOSIS — E8770 Fluid overload, unspecified: Secondary | ICD-10-CM

## 2013-03-13 DIAGNOSIS — Z8249 Family history of ischemic heart disease and other diseases of the circulatory system: Secondary | ICD-10-CM

## 2013-03-13 DIAGNOSIS — S0100XA Unspecified open wound of scalp, initial encounter: Principal | ICD-10-CM | POA: Diagnosis present

## 2013-03-13 DIAGNOSIS — G459 Transient cerebral ischemic attack, unspecified: Secondary | ICD-10-CM

## 2013-03-13 DIAGNOSIS — R1319 Other dysphagia: Secondary | ICD-10-CM

## 2013-03-13 DIAGNOSIS — I340 Nonrheumatic mitral (valve) insufficiency: Secondary | ICD-10-CM

## 2013-03-13 DIAGNOSIS — D649 Anemia, unspecified: Secondary | ICD-10-CM

## 2013-03-13 DIAGNOSIS — J4489 Other specified chronic obstructive pulmonary disease: Secondary | ICD-10-CM | POA: Diagnosis present

## 2013-03-13 DIAGNOSIS — R58 Hemorrhage, not elsewhere classified: Secondary | ICD-10-CM

## 2013-03-13 DIAGNOSIS — Z8673 Personal history of transient ischemic attack (TIA), and cerebral infarction without residual deficits: Secondary | ICD-10-CM

## 2013-03-13 DIAGNOSIS — F3289 Other specified depressive episodes: Secondary | ICD-10-CM | POA: Diagnosis present

## 2013-03-13 DIAGNOSIS — W1809XA Striking against other object with subsequent fall, initial encounter: Secondary | ICD-10-CM | POA: Diagnosis present

## 2013-03-13 DIAGNOSIS — K449 Diaphragmatic hernia without obstruction or gangrene: Secondary | ICD-10-CM

## 2013-03-13 DIAGNOSIS — I679 Cerebrovascular disease, unspecified: Secondary | ICD-10-CM

## 2013-03-13 DIAGNOSIS — R21 Rash and other nonspecific skin eruption: Secondary | ICD-10-CM

## 2013-03-13 DIAGNOSIS — R55 Syncope and collapse: Secondary | ICD-10-CM

## 2013-03-13 DIAGNOSIS — I4891 Unspecified atrial fibrillation: Secondary | ICD-10-CM | POA: Diagnosis present

## 2013-03-13 DIAGNOSIS — I2510 Atherosclerotic heart disease of native coronary artery without angina pectoris: Secondary | ICD-10-CM | POA: Diagnosis present

## 2013-03-13 DIAGNOSIS — J8410 Pulmonary fibrosis, unspecified: Secondary | ICD-10-CM

## 2013-03-13 DIAGNOSIS — K222 Esophageal obstruction: Secondary | ICD-10-CM

## 2013-03-13 DIAGNOSIS — E785 Hyperlipidemia, unspecified: Secondary | ICD-10-CM | POA: Diagnosis present

## 2013-03-13 DIAGNOSIS — I6529 Occlusion and stenosis of unspecified carotid artery: Secondary | ICD-10-CM

## 2013-03-13 DIAGNOSIS — J449 Chronic obstructive pulmonary disease, unspecified: Secondary | ICD-10-CM | POA: Diagnosis present

## 2013-03-13 LAB — CBC
HCT: 28.9 % — ABNORMAL LOW (ref 39.0–52.0)
HEMOGLOBIN: 9.5 g/dL — AB (ref 13.0–17.0)
MCH: 32.3 pg (ref 26.0–34.0)
MCHC: 32.9 g/dL (ref 30.0–36.0)
MCV: 98.3 fL (ref 78.0–100.0)
PLATELETS: 147 10*3/uL — AB (ref 150–400)
RBC: 2.94 MIL/uL — AB (ref 4.22–5.81)
RDW: 14.6 % (ref 11.5–15.5)
WBC: 5.6 10*3/uL (ref 4.0–10.5)

## 2013-03-13 LAB — BASIC METABOLIC PANEL
BUN: 30 mg/dL — ABNORMAL HIGH (ref 6–23)
CALCIUM: 9.3 mg/dL (ref 8.4–10.5)
CHLORIDE: 107 meq/L (ref 96–112)
CO2: 26 meq/L (ref 19–32)
Creatinine, Ser: 0.92 mg/dL (ref 0.50–1.35)
GFR calc Af Amer: 82 mL/min — ABNORMAL LOW (ref >90)
GFR calc non Af Amer: 70 mL/min — ABNORMAL LOW (ref >90)
Glucose, Bld: 132 mg/dL — ABNORMAL HIGH (ref 70–99)
Potassium: 3.6 mEq/L — ABNORMAL LOW (ref 3.7–5.3)
Sodium: 145 mEq/L (ref 137–147)

## 2013-03-13 LAB — PRO B NATRIURETIC PEPTIDE: PRO B NATRI PEPTIDE: 2235 pg/mL — AB (ref 0–450)

## 2013-03-13 MED ORDER — AMLODIPINE-ATORVASTATIN 5-20 MG PO TABS: 1.0000 | ORAL_TABLET | Freq: Every day | ORAL | Status: DC

## 2013-03-13 MED ORDER — VITAMIN B-12 100 MCG PO TABS: 100.0000 ug | ORAL_TABLET | Freq: Every day | ORAL | Status: DC

## 2013-03-13 MED ORDER — GABAPENTIN 100 MG PO CAPS
100.0000 mg | ORAL_CAPSULE | Freq: Three times a day (TID) | ORAL | Status: DC
  Administered 2013-03-13 – 2013-03-17 (×11): 100 mg via ORAL
  Filled 2013-03-13 (×14): qty 1

## 2013-03-13 MED ORDER — ONDANSETRON HCL 4 MG PO TABS
4.0000 mg | ORAL_TABLET | Freq: Four times a day (QID) | ORAL | Status: DC | PRN
Start: 2013-03-13 — End: 2013-03-17

## 2013-03-13 MED ORDER — TAMSULOSIN HCL 0.4 MG PO CAPS
0.4000 mg | ORAL_CAPSULE | Freq: Every day | ORAL | Status: DC
Start: 2013-03-13 — End: 2013-03-17
  Administered 2013-03-13 – 2013-03-17 (×5): 0.4 mg via ORAL
  Filled 2013-03-13 (×5): qty 1

## 2013-03-13 MED ORDER — METOPROLOL SUCCINATE ER 25 MG PO TB24
25.0000 mg | ORAL_TABLET | Freq: Every day | ORAL | Status: DC
Start: 2013-03-13 — End: 2013-03-17
  Administered 2013-03-13 – 2013-03-16 (×2): 25 mg via ORAL
  Filled 2013-03-13 (×5): qty 1

## 2013-03-13 MED ORDER — FINASTERIDE 5 MG PO TABS
5.0000 mg | ORAL_TABLET | Freq: Every day | ORAL | Status: DC
Start: 2013-03-13 — End: 2013-03-17
  Administered 2013-03-13 – 2013-03-17 (×5): 5 mg via ORAL
  Filled 2013-03-13 (×5): qty 1

## 2013-03-13 MED ORDER — AMLODIPINE BESYLATE 5 MG PO TABS
5.0000 mg | ORAL_TABLET | Freq: Every day | ORAL | Status: DC
Start: 2013-03-13 — End: 2013-03-17
  Administered 2013-03-13 – 2013-03-17 (×5): 5 mg via ORAL
  Filled 2013-03-13 (×5): qty 1

## 2013-03-13 MED ORDER — ESCITALOPRAM OXALATE 10 MG PO TABS
10.0000 mg | ORAL_TABLET | Freq: Every day | ORAL | Status: DC
  Administered 2013-03-13 – 2013-03-17 (×5): 10 mg via ORAL
  Filled 2013-03-13 (×5): qty 1

## 2013-03-13 MED ORDER — ACETAMINOPHEN 650 MG RE SUPP: 650.0000 mg | Freq: Four times a day (QID) | RECTAL | Status: DC | PRN

## 2013-03-13 MED ORDER — SODIUM CHLORIDE 0.9 % IV SOLN
INTRAVENOUS | Status: DC
  Administered 2013-03-13: 21:00:00 via INTRAVENOUS

## 2013-03-13 MED ORDER — ACETAMINOPHEN 325 MG PO TABS: 650.0000 mg | ORAL_TABLET | Freq: Four times a day (QID) | ORAL | Status: DC | PRN

## 2013-03-13 MED ORDER — VITAMIN B-12 100 MCG PO TABS
100.0000 ug | ORAL_TABLET | Freq: Every day | ORAL | Status: DC
  Administered 2013-03-14 – 2013-03-17 (×4): 100 ug via ORAL
  Filled 2013-03-13 (×4): qty 1

## 2013-03-13 MED ORDER — ONDANSETRON HCL 4 MG/2ML IJ SOLN: 4.0000 mg | Freq: Four times a day (QID) | INTRAMUSCULAR | Status: DC | PRN

## 2013-03-13 MED ORDER — PANTOPRAZOLE SODIUM 40 MG PO TBEC
40.0000 mg | DELAYED_RELEASE_TABLET | Freq: Every day | ORAL | Status: DC
  Administered 2013-03-14 – 2013-03-17 (×4): 40 mg via ORAL
  Filled 2013-03-13 (×4): qty 1

## 2013-03-13 MED ORDER — ATORVASTATIN CALCIUM 20 MG PO TABS
20.0000 mg | ORAL_TABLET | Freq: Every day | ORAL | Status: DC
  Administered 2013-03-13 – 2013-03-17 (×5): 20 mg via ORAL
  Filled 2013-03-13 (×5): qty 1

## 2013-03-13 NOTE — ED Provider Notes (Signed)
CSN: [id number]     Arrival date & time 03/13/13  1037 History   First MD Initiated Contact with Patient 03/13/13 1050     Chief Complaint  Patient presents with  . Fall     (Consider location/radiation/quality/duration/timing/severity/associated sxs/prior Treatment) The history is provided by the patient, the spouse and a relative.    Patient returns to the ED today, d/c home yesterday after mechanical fall with scalp laceration.  Pt has multiple falls, was seen in ED and had scalp repaired with staples.  He is on plavix. There was some difficulty controlling bleeding per note, but it appeared to have been controlled upon discharge.  Family states that the bleeding started again and continued throughout the night, soaking multiple bandages.  Denies any headache, change in mental status, weakness, focal neurologic complaints.  Family supports that patient has had no complaints except for bleeding and is at his baseline mentally and physically.    Past Medical History  Diagnosis Date  . CAD (coronary artery disease)     a. s/p remote MI 771982  . Syncope   . TIA (transient ischemic attack)   . Zenker's diverticulum   . Hiatal hernia   . GERD (gastroesophageal reflux disease)   . Depression   . Nephrolithiasis   . Hyperlipidemia   . HTN (hypertension)   . Atrial fibrillation     a. Dx 04/2011  . Gallstones   . COPD (chronic obstructive pulmonary disease)   . Peptic stricture of esophagus   . CVA (cerebral infarction)   . Trigeminal neuralgia   . Esophageal dysmotility   . Gastric polyp    Past Surgical History  Procedure Laterality Date  . Laparoscopic cholecystectomy    . Implantation of a medtronic implantation loop recorder  03/01/2006    Dr. Ladona Ridgelaylor   Family History  Problem Relation Age of Onset  . Coronary artery disease    . Cancer Daughter     eye cancer   History  Substance Use Topics  . Smoking status: Former Smoker    Quit date: 05/07/1961  . Smokeless  tobacco: Never Used  . Alcohol Use: No    Review of Systems  Constitutional: Negative for fever.  Respiratory: Negative for cough and shortness of breath.   Cardiovascular: Negative for chest pain.  Gastrointestinal: Negative for nausea, vomiting, abdominal pain and diarrhea.  Genitourinary: Negative for dysuria, urgency and frequency.  All other systems reviewed and are negative.      Allergies  Review of patient's allergies indicates no known allergies.  Home Medications   Current Outpatient Rx  Name  Route  Sig  Dispense  Refill  . amLODipine-atorvastatin (CADUET) 5-20 MG per tablet   Oral   Take 1 tablet by mouth daily.         . clopidogrel (PLAVIX) 75 MG tablet   Oral   Take 1 tablet (75 mg total) by mouth daily.   90 tablet   3   . Cyanocobalamin (VITAMIN B12 PO)   Oral   Take 1 tablet by mouth daily.          . ergocalciferol (VITAMIN D2) 50000 UNITS capsule   Oral   Take 50,000 Units by mouth once a week.         . escitalopram (LEXAPRO) 10 MG tablet   Oral   Take 10 mg by mouth daily.         . finasteride (PROSCAR) 5 MG tablet   Oral  Take 5 mg by mouth daily.         Marland Kitchen gabapentin (NEURONTIN) 100 MG capsule   Oral   Take 100 mg by mouth 3 (three) times daily.         . metoprolol succinate (TOPROL-XL) 25 MG 24 hr tablet   Oral   Take 25 mg by mouth daily.         Marland Kitchen omeprazole (PRILOSEC) 10 MG capsule   Oral   Take 10 mg by mouth daily.         . tamsulosin (FLOMAX) 0.4 MG CAPS capsule   Oral   Take 1 capsule by mouth daily.          BP 180/89  Pulse 71  Temp(Src) 97.8 F (36.6 C) (Oral)  Resp 14  SpO2 100% Physical Exam  Nursing note and vitals reviewed. Constitutional: He appears well-developed and well-nourished. No distress.  HENT:  Head: Normocephalic.  Large amount of dark red blood matting hair and dried blood over face and head.  Bandage soaked through with blood removed by nurse.   After being  cleaned: irregular laceration with 4 staples in place, oozing blood from inferior aspect.    Neck: Neck supple.  Pulmonary/Chest: Effort normal.  Neurological: He is alert. He has normal strength. No cranial nerve deficit or sensory deficit. He exhibits normal muscle tone. GCS eye subscore is 4. GCS verbal subscore is 5. GCS motor subscore is 6.  Skin: He is not diaphoretic.    ED Course  Procedures (including critical care time) Labs Review Labs Reviewed  CBC - Abnormal; Notable for the following:    RBC 2.94 (*)    Hemoglobin 9.5 (*)    HCT 28.9 (*)    Platelets 147 (*)    All other components within normal limits  BASIC METABOLIC PANEL - Abnormal; Notable for the following:    Potassium 3.6 (*)    Glucose, Bld 132 (*)    BUN 30 (*)    GFR calc non Af Amer 70 (*)    GFR calc Af Amer 82 (*)    All other components within normal limits   Imaging Review Ct Head Wo Contrast  03/12/2013   CLINICAL DATA:  Fall, right side laceration  EXAM: CT HEAD WITHOUT CONTRAST  CT CERVICAL SPINE WITHOUT CONTRAST  TECHNIQUE: Multidetector CT imaging of the head and cervical spine was performed following the standard protocol without intravenous contrast. Multiplanar CT image reconstructions of the cervical spine were also generated.  COMPARISON:  09/16/2009  FINDINGS: CT HEAD FINDINGS  No skull fracture is noted. There is scalp swelling in right parietal region high convexity. Probable laceration in right posterior parietal scalp axial image 30.  No intracranial hemorrhage, mass effect or midline shift. Stable cerebral atrophy. Again noted periventricular and patchy subcortical chronic white matter disease.  No acute cortical infarction. No mass lesion is noted on this unenhanced scan.  CT CERVICAL SPINE FINDINGS  Axial images of the cervical spine shows no acute fracture or subluxation. There is bilateral apical scarring right greater than left. No pneumothorax noted in visualized lung apices.  Computer  processed images shows no acute fracture or subluxation. Extensive degenerative changes are noted C1-C2 articulation. There is significant disc space flattening with mild anterior and mild posterior spurring at C5-C6 level. Mild disc space flattening with mild anterior and posterior spurring at C6-C7 level. No prevertebral soft tissue swelling. Cervical airway is patent. Mild posterior spurring at C4-C5 level.  IMPRESSION:  1. No acute intracranial abnormality. Stable atrophy and chronic white matter disease. There is scalp swelling in right parietal region. Probable scalp laceration right posterior parietal region. 2. No cervical spine acute fracture or subluxation. Degenerative changes as described above.   Electronically Signed   By: Natasha Mead M.D.   On: 03/12/2013 11:10   Ct Cervical Spine Wo Contrast  03/12/2013   CLINICAL DATA:  Fall, right side laceration  EXAM: CT HEAD WITHOUT CONTRAST  CT CERVICAL SPINE WITHOUT CONTRAST  TECHNIQUE: Multidetector CT imaging of the head and cervical spine was performed following the standard protocol without intravenous contrast. Multiplanar CT image reconstructions of the cervical spine were also generated.  COMPARISON:  09/16/2009  FINDINGS: CT HEAD FINDINGS  No skull fracture is noted. There is scalp swelling in right parietal region high convexity. Probable laceration in right posterior parietal scalp axial image 30.  No intracranial hemorrhage, mass effect or midline shift. Stable cerebral atrophy. Again noted periventricular and patchy subcortical chronic white matter disease.  No acute cortical infarction. No mass lesion is noted on this unenhanced scan.  CT CERVICAL SPINE FINDINGS  Axial images of the cervical spine shows no acute fracture or subluxation. There is bilateral apical scarring right greater than left. No pneumothorax noted in visualized lung apices.  Computer processed images shows no acute fracture or subluxation. Extensive degenerative changes are  noted C1-C2 articulation. There is significant disc space flattening with mild anterior and mild posterior spurring at C5-C6 level. Mild disc space flattening with mild anterior and posterior spurring at C6-C7 level. No prevertebral soft tissue swelling. Cervical airway is patent. Mild posterior spurring at C4-C5 level.  IMPRESSION: 1. No acute intracranial abnormality. Stable atrophy and chronic white matter disease. There is scalp swelling in right parietal region. Probable scalp laceration right posterior parietal region. 2. No cervical spine acute fracture or subluxation. Degenerative changes as described above.   Electronically Signed   By: Natasha Mead M.D.   On: 03/12/2013 11:10     EKG Interpretation None      11:06 AM Discussed patient with Dr Denton Lank.   LACERATION REPAIR Performed by: Trixie Dredge Authorized by: Trixie Dredge Consent: Verbal consent obtained. Risks and benefits: risks, benefits and alternatives were discussed Consent given by: patient Patient identity confirmed: provided demographic data Prepped and Draped in normal sterile fashion Wound explored  Laceration Location: right scalp  Laceration Length: 7cm  No Foreign Bodies seen or palpated  Anesthesia: local infiltration  Local anesthetic: lidocaine 2% with epinephrine  Anesthetic total: 5 ml  Irrigation method: syringe Amount of cleaning: standard  Skin closure: 4-0 ethilon  Number of sutures: 8  Technique: simple interrupted  Patient tolerance: Patient tolerated the procedure well with no immediate complications.  SUTURE REMOVAL Performed by: Trixie Dredge  Consent: Verbal consent obtained. Consent given by: patient Required items: required blood products, implants, devices, and special equipment available Time out: Immediately prior to procedure a "time out" was called to verify the correct patient, procedure, equipment, support staff and site/side marked as required.  Location: right  scalp  Wound Appearance: clean  Sutures/Staples Removed: 2 staples.  2 staples remain in scalp.   Patient tolerance: Patient tolerated the procedure well with no immediate complications.      1:59 PM Admitted to [facility] for observation.    MDM   Final diagnoses:  Bleeding  Anemia  Scalp laceration   Pt with mechanical fall yesterday with scalp laceration, repaired in ED and d/c home.  Some continued bleeding at home overnight and into today.  Pt is on xarelto.  No other concerns or symptoms. I removed two of the inferiormost staples and sutured the wound in that area as this seemed to be the source of the continued bleeding.  Wound was hemostatic upon completion and upon recheck later in the ED visit.  As I did use lidocaine with epi, which likely contributed to the hemostasis, and patient's Hgb lower than his baseline (9.5 today, most recently (2013) over 11), admitted to observation overnight to ensure hemostasis and stable Hgb.  Admitted to [facility].      [place], PA-C 03/13/13 [phone]

## 2013-03-13 NOTE — ED Notes (Addendum)
Pt here for continued bleeding from scalp. Pt was here a day ago and had staples placed in his head. Per family pt head has been bleeding continuously since he left the hospital. When pt arrived he had multiple blood soaked bandaged on head and around face. All removed and lots of thick blood clots on head. Pt head currently soaking to loosen up some of the old blood and blood clots. Pt denies pain

## 2013-03-13 NOTE — H&P (Signed)
History and Physical       Hospital Admission Note Date: 03/13/2013  Patient name: Nathan Mills Medical record number: [id number] Date of birth: Jul 20, 1917 Age: 80 y.o. Gender: male  PCP:  Duane Lopeoss, Alan, MD    Chief Complaint:  Multiple falls with a scalp laceration and bleeding  HPI: Patient is a 80 year old male with history of CAD, syncope, GERD, CVA, atrial fibrillation on Plavix, had initially come to the ER yesterday with mechanical fall and hitting his head. History was obtained from the patient, his wife and the daughter in the room. Patient has had multiple falls recently (per daughter about 'dozen' in last year, and at least 3 times in the last 1 month). Patient usually ambulates with a walker. Yesterday at night, he was coming from the bathroom and was dark in the room when he stumbled upon a furniture in the bedroom and hit his head. Patient got up himself but he continued to bleed from his scalp. EMS was called, patient was seen by Dr. Jeraldine LootsLockwood, staples were placed on the laceration by EDP, Dr. Jeraldine LootsLockwood. Home health was arranged from the ER and patient was discharged. Patient however continued to bleed at home again whole night. Per his wife, his pillow was soaking with blood.  In the ED, multiple bloodsoaked bandages were removed and lots of thick blood clots were removed. Overnight observation admission was requested. At the time of my encounter, active bleeding has stopped, there is significant amount of dried blood on the head. He denied any chest pain, dizziness, lightheadedness or syncopal episode.   Review of Systems:  Constitutional: Denies fever, chills, diaphoresis, poor appetite and fatigue.  HEENT: Denies photophobia, eye pain, redness, hearing loss, ear pain, congestion, sore throat, rhinorrhea, sneezing, mouth sores, trouble swallowing, neck pain, neck stiffness and tinnitus.   Respiratory: Denies SOB, DOE, cough,  chest tightness,  and wheezing.   Cardiovascular: Denies chest pain, palpitations and leg swelling.  Gastrointestinal: Denies nausea, vomiting, abdominal pain, diarrhea, constipation, blood in stool and abdominal distention.  Genitourinary: Denies dysuria, urgency, frequency, hematuria, flank pain and difficulty urinating.  Musculoskeletal:  please see above  Skin: Denies pallor, rash and wound.  Neurological: Denies dizziness, seizures, syncope, weakness, light-headedness, numbness and headaches.  Hematological: Denies adenopathy. Easy bruising, personal or family bleeding history  Psychiatric/Behavioral: Denies suicidal ideation, mood changes, confusion, nervousness, sleep disturbance and agitation  Past Medical History: Past Medical History  Diagnosis Date  . CAD (coronary artery disease)     a. s/p remote MI 231982  . Syncope   . TIA (transient ischemic attack)   . Zenker's diverticulum   . Hiatal hernia   . GERD (gastroesophageal reflux disease)   . Depression   . Nephrolithiasis   . Hyperlipidemia   . HTN (hypertension)   . Atrial fibrillation     a. Dx 04/2011  . Gallstones   . COPD (chronic obstructive pulmonary disease)   . Peptic stricture of esophagus   . CVA (cerebral infarction)   . Trigeminal neuralgia   . Esophageal dysmotility   . Gastric polyp    Past Surgical History  Procedure Laterality Date  . Laparoscopic cholecystectomy    . Implantation of a medtronic implantation loop recorder  03/01/2006    Dr. Ladona Ridgelaylor    Medications: Prior to Admission medications   Medication Sig Start Date End Date Taking? Authorizing Provider  amLODipine-atorvastatin (CADUET) 5-20 MG per tablet Take 1 tablet by mouth daily.   Yes Historical Provider, MD  clopidogrel (  PLAVIX) 75 MG tablet Take 1 tablet (75 mg total) by mouth daily. 03/29/12  Yes Herby Abraham, MD  Cyanocobalamin (VITAMIN B12 PO) Take 1 tablet by mouth daily.    Yes Historical Provider, MD  ergocalciferol  (VITAMIN D2) 50000 UNITS capsule Take 50,000 Units by mouth once a week.   Yes Historical Provider, MD  escitalopram (LEXAPRO) 10 MG tablet Take 10 mg by mouth daily.   Yes Historical Provider, MD  finasteride (PROSCAR) 5 MG tablet Take 5 mg by mouth daily. 03/11/13  Yes Historical Provider, MD  gabapentin (NEURONTIN) 100 MG capsule Take 100 mg by mouth 3 (three) times daily. 03/13/13  Yes Historical Provider, MD  metoprolol succinate (TOPROL-XL) 25 MG 24 hr tablet Take 25 mg by mouth daily. 01/03/13  Yes Historical Provider, MD  omeprazole (PRILOSEC) 10 MG capsule Take 10 mg by mouth daily.   Yes Historical Provider, MD  tamsulosin (FLOMAX) 0.4 MG CAPS capsule Take 1 capsule by mouth daily. 02/05/13  Yes Historical Provider, MD    Allergies:  No Known Allergies  Social History:  reports that he quit smoking about 51 years ago. He has never used smokeless tobacco. He reports that he does not drink alcohol or use illicit drugs.He currently lives at home   Family History: Family History  Problem Relation Age of Onset  . Coronary artery disease    . Cancer Daughter     eye cancer    Physical Exam: Blood pressure 96/77, pulse 77, temperature 97.8 F (36.6 C), temperature source Oral, resp. rate 14, SpO2 97.00%. General: Alert, awake, oriented x3, in no acute distress, Very frail . HEENT: normocephalic, atraumatic, anicteric sclera, pink conjunctiva, pupils equal and reactive to light and accomodation, oropharynx clear significant amount of dried blood on head, staples on the right parietal scalp, also dried blood on the face, poor dentition  Neck: supple, no masses or lymphadenopathy, no goiter, no bruits  Heart: Regular rate and rhythm, without murmurs, rubs or gallops. Lungs: Clear to auscultation bilaterally, no wheezing, rales or rhonchi. Abdomen: Soft, nontender, nondistended, positive bowel sounds, no masses. Extremities: No clubbing, cyanosis or edema with positive pedal pulses.atrophy   Neuro: Grossly intact, no focal neurological deficits, strength 5/5 upper and lower extremities bilaterally Psych: alert and oriented x 3, normal mood and affect Skin: no rashes or lesions, warm and dry   LABS on Admission:  Basic Metabolic Panel:  Recent Labs Lab 03/13/13 1110  NA 145  K 3.6*  CL 107  CO2 26  GLUCOSE 132*  BUN 30*  CREATININE 0.92  CALCIUM 9.3   Liver Function Tests: No results found for this basename: AST, ALT, ALKPHOS, BILITOT, PROT, ALBUMIN,  in the last 168 hours No results found for this basename: LIPASE, AMYLASE,  in the last 168 hours No results found for this basename: AMMONIA,  in the last 168 hours CBC:  Recent Labs Lab 03/13/13 1110  WBC 5.6  HGB 9.5*  HCT 28.9*  MCV 98.3  PLT 147*   Cardiac Enzymes: No results found for this basename: CKTOTAL, CKMB, CKMBINDEX, TROPONINI,  in the last 168 hours BNP: No components found with this basename: POCBNP,  CBG: No results found for this basename: GLUCAP,  in the last 168 hours   Radiological Exams on Admission: Ct Head Wo Contrast  03/12/2013   CLINICAL DATA:  Fall, right side laceration  EXAM: CT HEAD WITHOUT CONTRAST  CT CERVICAL SPINE WITHOUT CONTRAST  TECHNIQUE: Multidetector CT imaging of the head  and cervical spine was performed following the standard protocol without intravenous contrast. Multiplanar CT image reconstructions of the cervical spine were also generated.  COMPARISON:  09/16/2009  FINDINGS: CT HEAD FINDINGS  No skull fracture is noted. There is scalp swelling in right parietal region high convexity. Probable laceration in right posterior parietal scalp axial image 30.  No intracranial hemorrhage, mass effect or midline shift. Stable cerebral atrophy. Again noted periventricular and patchy subcortical chronic white matter disease.  No acute cortical infarction. No mass lesion is noted on this unenhanced scan.  CT CERVICAL SPINE FINDINGS  Axial images of the cervical spine shows no  acute fracture or subluxation. There is bilateral apical scarring right greater than left. No pneumothorax noted in visualized lung apices.  Computer processed images shows no acute fracture or subluxation. Extensive degenerative changes are noted C1-C2 articulation. There is significant disc space flattening with mild anterior and mild posterior spurring at C5-C6 level. Mild disc space flattening with mild anterior and posterior spurring at C6-C7 level. No prevertebral soft tissue swelling. Cervical airway is patent. Mild posterior spurring at C4-C5 level.  IMPRESSION: 1. No acute intracranial abnormality. Stable atrophy and chronic white matter disease. There is scalp swelling in right parietal region. Probable scalp laceration right posterior parietal region. 2. No cervical spine acute fracture or subluxation. Degenerative changes as described above.   Electronically Signed   By: Natasha Mead M.D.   On: 03/12/2013 11:10   Ct Cervical Spine Wo Contrast  03/12/2013   CLINICAL DATA:  Fall, right side laceration  EXAM: CT HEAD WITHOUT CONTRAST  CT CERVICAL SPINE WITHOUT CONTRAST  TECHNIQUE: Multidetector CT imaging of the head and cervical spine was performed following the standard protocol without intravenous contrast. Multiplanar CT image reconstructions of the cervical spine were also generated.  COMPARISON:  09/16/2009  FINDINGS: CT HEAD FINDINGS  No skull fracture is noted. There is scalp swelling in right parietal region high convexity. Probable laceration in right posterior parietal scalp axial image 30.  No intracranial hemorrhage, mass effect or midline shift. Stable cerebral atrophy. Again noted periventricular and patchy subcortical chronic white matter disease.  No acute cortical infarction. No mass lesion is noted on this unenhanced scan.  CT CERVICAL SPINE FINDINGS  Axial images of the cervical spine shows no acute fracture or subluxation. There is bilateral apical scarring right greater than left. No  pneumothorax noted in visualized lung apices.  Computer processed images shows no acute fracture or subluxation. Extensive degenerative changes are noted C1-C2 articulation. There is significant disc space flattening with mild anterior and mild posterior spurring at C5-C6 level. Mild disc space flattening with mild anterior and posterior spurring at C6-C7 level. No prevertebral soft tissue swelling. Cervical airway is patent. Mild posterior spurring at C4-C5 level.  IMPRESSION: 1. No acute intracranial abnormality. Stable atrophy and chronic white matter disease. There is scalp swelling in right parietal region. Probable scalp laceration right posterior parietal region. 2. No cervical spine acute fracture or subluxation. Degenerative changes as described above.   Electronically Signed   By: Natasha Mead M.D.   On: 03/12/2013 11:10    Assessment/Plan Principal Problem:   Fall against object with scalp laceration and bleeding, multiple falls - Will admit overnight for observation, hemoglobin is currently 9.5, no prior baseline hemoglobin/hematocrit - Patient denies any syncopal episode but has had multiple falls in the last few months - Check UA to rule out any UTI, fall precautions - PT evaluation. I discussed in detail with  the patient and his family regarding possibly to increase the level of care with a caregiver or assisted living facility. He currently lives at home with his wife.   Active Problems:   HYPERTENSION: Currently stable, continue the beta blocker, amlodipine    CAD - Hold Plavix due to bleeding, continue beta blocker, amlodipine, statins    GERD - Continue PPI    A-fib - Continue beta blocker, hold Plavix  History of CVA: Currently no acute neurological deficits - Hold Plavix due to current bleeding issues  DVT prophylaxis: SCDs  CODE STATUS: Full code  Family Communication: Admission, patients condition and plan of care including tests being ordered have been discussed  with the patient and daughter, wife who indicates understanding and agree with the plan and Code Status   Further plan will depend as patient's clinical course evolves and further radiologic and laboratory data become available.   Time Spent on Admission: 45 minutes  RAI,RIPUDEEP M.D. [facility] 03/13/2013, 2:53 PM Pager: [phone]  If 7PM-7AM, please contact night-coverage [email or web address] Password [email or web address]

## 2013-03-13 NOTE — ED Notes (Signed)
Attempted to call report. Charge RN to call back.

## 2013-03-13 NOTE — ED Notes (Signed)
Pt here for bleeding from the head and bleeding not controlled. Pt has staples in his lead from a lac. Pt has been bleeding since he left the hospital. Soaked pillow this am and soaked bandages on arrival. VS 148/80 sys. Pt alert and speech garbled but normal due to denture plate.

## 2013-03-13 NOTE — ED Notes (Signed)
PA at bedside. Requested for meal tray for pt.

## 2013-03-14 DIAGNOSIS — I10 Essential (primary) hypertension: Secondary | ICD-10-CM

## 2013-03-14 DIAGNOSIS — D62 Acute posthemorrhagic anemia: Secondary | ICD-10-CM

## 2013-03-14 DIAGNOSIS — S0100XA Unspecified open wound of scalp, initial encounter: Principal | ICD-10-CM

## 2013-03-14 LAB — CBC
HCT: 22 % — ABNORMAL LOW (ref 39.0–52.0)
Hemoglobin: 7.3 g/dL — ABNORMAL LOW (ref 13.0–17.0)
MCH: 32.4 pg (ref 26.0–34.0)
MCHC: 33.2 g/dL (ref 30.0–36.0)
MCV: 97.8 fL (ref 78.0–100.0)
PLATELETS: 111 10*3/uL — AB (ref 150–400)
RBC: 2.25 MIL/uL — ABNORMAL LOW (ref 4.22–5.81)
RDW: 14.6 % (ref 11.5–15.5)
WBC: 5.3 10*3/uL (ref 4.0–10.5)

## 2013-03-14 LAB — BASIC METABOLIC PANEL
BUN: 33 mg/dL — ABNORMAL HIGH (ref 6–23)
CALCIUM: 8.3 mg/dL — AB (ref 8.4–10.5)
CO2: 25 meq/L (ref 19–32)
CREATININE: 0.93 mg/dL (ref 0.50–1.35)
Chloride: 113 mEq/L — ABNORMAL HIGH (ref 96–112)
GFR calc Af Amer: 81 mL/min — ABNORMAL LOW (ref >90)
GFR calc non Af Amer: 70 mL/min — ABNORMAL LOW (ref >90)
GLUCOSE: 112 mg/dL — AB (ref 70–99)
Potassium: 3.8 mEq/L (ref 3.7–5.3)
Sodium: 149 mEq/L — ABNORMAL HIGH (ref 137–147)

## 2013-03-14 LAB — IRON AND TIBC
Iron: 64 ug/dL (ref 42–135)
Saturation Ratios: 27 % (ref 20–55)
TIBC: 235 ug/dL (ref 215–435)
UIBC: 171 ug/dL (ref 125–400)

## 2013-03-14 LAB — HEMOGLOBIN A1C
Hgb A1c MFr Bld: 5.9 % — ABNORMAL HIGH (ref <5.7)
Mean Plasma Glucose: 123 mg/dL — ABNORMAL HIGH (ref <117)

## 2013-03-14 LAB — VITAMIN B12: Vitamin B-12: 762 pg/mL (ref 211–911)

## 2013-03-14 LAB — FOLATE: Folate: 17 ng/mL

## 2013-03-14 LAB — TSH: TSH: 5.244 u[IU]/mL — ABNORMAL HIGH (ref 0.350–4.500)

## 2013-03-14 LAB — HEMOGLOBIN AND HEMATOCRIT, BLOOD
HCT: 23.4 % — ABNORMAL LOW (ref 39.0–52.0)
Hemoglobin: 7.7 g/dL — ABNORMAL LOW (ref 13.0–17.0)

## 2013-03-14 LAB — FERRITIN: FERRITIN: 54 ng/mL (ref 22–322)

## 2013-03-14 MED ORDER — DEXTROSE-NACL 5-0.45 % IV SOLN
INTRAVENOUS | Status: AC
  Administered 2013-03-14: 11:00:00 via INTRAVENOUS

## 2013-03-14 MED ORDER — DIPHENHYDRAMINE HCL 50 MG PO CAPS
50.0000 mg | ORAL_CAPSULE | Freq: Once | ORAL | Status: AC
  Administered 2013-03-14: 50 mg via ORAL
  Filled 2013-03-14: qty 2
  Filled 2013-03-14: qty 1

## 2013-03-14 MED ORDER — DEXTROSE-NACL 5-0.45 % IV SOLN: INTRAVENOUS | Status: DC

## 2013-03-14 NOTE — Progress Notes (Signed)
UR completed. Patient changed to inpatient- requiring IVF @ 75cc/hr 

## 2013-03-14 NOTE — Evaluation (Signed)
Occupational Therapy Evaluation Patient Details Name: Nathan ParadiseWilliam E Mills MRN: [id number] DOB: [birth year] Today's Date: 03/14/2013 Time: [id number] OT Time Calculation (min): 30 min  OT Assessment / Plan / Recommendation History of present illness Pt is a pleasant 80 y/o male, s/p fall w/ scalp laceration and bleeding.   Clinical Impression   Pt w/ h/o falls increasing over last month, lives w/ spouse and he reports she is unable to assist him physically. Pt currently Min-mod A for functional mobility & safe transfers. Pt participated in ADL retraining session and also discussed possible need to increase level of care ?ALF. No family present at time of assessment, will follow acutely.     OT Assessment  Patient needs continued OT Services    Follow Up Recommendations  SNF;Supervision/Assistance - 24 hour    Barriers to Discharge Decreased caregiver support Pt reports spouse is not in good health and is unable to assist him physically.  Equipment Recommendations  3 in 1 bedside comode    Recommendations for Other Services    Frequency  Min 2X/week    Precautions / Restrictions Precautions Precautions: Fall Precaution Comments: Pt with multiple falls over last year including several falls over last month per H & P in chart. Restrictions Weight Bearing Restrictions: No   Pertinent Vitals/Pain No, denies pain    ADL  Grooming: Performed;Wash/dry hands;Min guard (Standing at sink in bathroom) Where Assessed - Grooming: Supported standing;Unsupported standing Upper Body Bathing: Simulated;Set up Where Assessed - Upper Body Bathing: Supported sitting Lower Body Bathing: Simulated;Minimal assistance Where Assessed - Lower Body Bathing: Supported sit to stand Upper Body Dressing: Simulated;Set up Where Assessed - Upper Body Dressing: Supported sitting Lower Body Dressing: Simulated;Minimal assistance Where Assessed - Lower Body Dressing: Supported sit to stand Toilet Transfer:  Performed;Minimal assistance;Moderate assistance (Pt required Min-mod assist prior to sitting on toilet. Pt attempted to sit prior to being behind toilet, was off to right side/high fall risk.) Toilet Transfer Method: Sit to stand Toilet Transfer Equipment: Comfort height toilet;Grab bars;Other (comment) (Recommend 3:1 over toilet) Toileting - Clothing Manipulation and Hygiene: Performed;Minimal assistance Where Assessed - Toileting Clothing Manipulation and Hygiene: Sit to stand from 3-in-1 or toilet Tub/Shower Transfer Method: Not assessed Equipment Used: Rolling walker Transfers/Ambulation Related to ADLs: Pt was min-mod assist for sit to stand and toilet transfer today. Pt requires min to mod vc's for safety, sequencing, hand positioning. See comments for toilet transfer above. ADL Comments: Pt w/ h/o falls increasing over last month, lives w/ spouse and reports she is unable to assist him physically. Pt currently Min-mod A for functional mobility & safe transfers. Pt participated in ADL retraining session and also discussed possible need to increase care ?ALF.No family present at time of assessment, will follow acutely.    OT Diagnosis: Generalized weakness;Other (comment) (H/o multiple falls over last month)  OT Problem List: Decreased strength;Decreased activity tolerance;Impaired balance (sitting and/or standing);Decreased knowledge of use of DME or AE;Other (comment) (H/o falls increasing over last month) OT Treatment Interventions: Self-care/ADL training;Patient/family education;Therapeutic activities;Balance training;Therapeutic exercise   OT Goals(Current goals can be found in the care plan section) Acute Rehab OT Goals Patient Stated Goal: Maybe I need some therapy Time For Goal Achievement: 03/28/13 Potential to Achieve Goals: Fair  Visit Information  Last OT Received On: 03/14/13 Assistance Needed: +1 History of Present Illness: Pt is a pleasant 80 y/o male, s/p fall w/ scalp  laceration and bleeding.       Prior Functioning     Home  Living Family/patient expects to be discharged to:: Private residence Living Arrangements: Spouse/significant other;Other (Comment) (Pt reports wife is not in good health. "She doesn't move very well, has a really bad knee and takes a lot of medication") Available Help at Discharge: Other (Comment) (Pt reports that he lives w/ his wife whom is not in good health. Has daughter whom lives nearby and assists w/ homemaking, grocery shopping etc.) Type of Home: House Home Access: Stairs to enter Secretary/administrator of Steps: 1 Entrance Stairs-Rails: Right;Left Home Layout: One level Home Equipment: Environmental consultant - 2 wheels;Shower seat;Grab bars - tub/shower Prior Function Level of Independence: Needs assistance Gait / Transfers Assistance Needed: Pt ambulates w/ RW and has history of multiple falls iver last year, increasing over last month per chart reivew. ADL's / Homemaking Assistance Needed: Pt states that he has a daughter that lives nearby whom assists w/ cleaning and grocery shopping.  Communication / Swallowing Assistance Needed: Somewhat garbled speech.  Comments: Pt states that his wife is not in good health or able to assist him physically. Communication Communication: HOH;Other (comment) (Somewhat garbled speech) Dominant Hand: Right    Vision/Perception Vision - History Baseline Vision: Wears glasses only for reading Patient Visual Report: No change from baseline Vision - Assessment Vision Assessment: Vision not tested   Cognition  Cognition Arousal/Alertness: Awake/alert Behavior During Therapy: WFL for tasks assessed/performed Overall Cognitive Status: Within Functional Limits for tasks assessed    Extremity/Trunk Assessment Upper Extremity Assessment Upper Extremity Assessment: Generalized weakness Lower Extremity Assessment Lower Extremity Assessment: Defer to PT evaluation     Mobility Bed  Mobility General bed mobility comments: Pt up in chair upon OT arrival  Transfers Overall transfer level: Needs assistance Equipment used: Rolling walker (2 wheeled) Transfers: Sit to/from Stand Sit to Stand: Min assist General transfer comment: VC's for safety and sequencing w/ RW, pt tends to "Pull up" on RW for sit to stand transfers and not fully behind commode before attempting to sit, HIGH fall risk. He required Mod VC's and Min-mod Assist to correct.        Balance Balance Overall balance assessment: History of Falls;Needs assistance Sitting-balance support: Feet supported Sitting balance-Leahy Scale: Fair Standing balance support: Bilateral upper extremity supported Standing balance-Leahy Scale: Fair   End of Session OT - End of Session Equipment Utilized During Treatment: Rolling walker Activity Tolerance: Patient tolerated treatment well Patient left: in chair;with call bell/phone within reach Nurse Communication: Mobility status;Other (comment) (Recommending possible ALF, or SNF w/ 24/hr A prior to d/c home)  GO Functional Assessment Tool Used: Clinical judgement Functional Limitation: Self care Self Care Current Status (Z6109): At least 20 percent but less than 40 percent impaired, limited or restricted Self Care Goal Status (U0454): At least 1 percent but less than 20 percent impaired, limited or restricted   Alm Bustard 03/14/2013, 11:25 AM

## 2013-03-14 NOTE — ED Provider Notes (Signed)
Medical screening examination/treatment/procedure(s) were conducted as a shared visit with non-physician practitioner(s) and myself.  I personally evaluated the patient during the encounter.   EKG Interpretation None      Pt with scalp lac yesterday, noted to persistently bleed post closing yesterday, now w large amount blood on hair/persistent bleeding from wound. Pts hair matted w clots around wound. Spine nt.   Suzi RootsKevin E Tijuana Scheidegger, MD 03/14/13 [phone]

## 2013-03-14 NOTE — Progress Notes (Signed)
[facility] PROGRESS NOTE  Nathan Mills:[id number] DOB: October 18, 1917 DOA: 03/13/2013 PCP:  Duane Lope, MD  Assessment/Plan: #1 scalp laceration Secondary to falling against an object/multiple falls.hemoglobin is down to 7.3. Repeat H&H. PT/OT. His hemoglobin stabilizes over the next 24-48 hours and patient is stable likely need skilled nursing facility. Continue to hold Plavix.  #2 anemia/acute blood loss anemia Secondary to fall. Hemoglobin currently 7.3. Follow. Transfusion threshold hemoglobin less than 7.  #3 hypertension Stable. Continue Norvasc and metoprolol and Flomax.  #4 hyperlipidemia Continue Lipitor.  #5 coronary artery disease Continue Norvasc, Lipitor, metoprolol. Plavix on hold secondary to problem #1.  #6 atrial fibrillation Metoprolol for rate control. Plavix on hold secondary to problems #1 and 2.  #7 gastroesophageal reflux disease PPI.  #8 history of CVA Plavix on hold secondary to problems #1 and 2. Follow.  #9 DVT prophylaxis SCDs.   Code Status: full Family Communication: updated the patient no family present. Disposition Plan: SNF when medically stable.   Consultants:  none  Procedures:  CT head and CT C-spine 03/12/2013    Antibiotics:  none  HPI/Subjective: Patient with no complaints. Patient denies any further bleeding.  Objective: Filed Vitals:   03/14/13 1430  BP: 117/101  Pulse: 45  Temp: 97.7 F (36.5 C)  Resp: 20    Intake/Output Summary (Last 24 hours) at 03/14/13 1730 Last data filed at 03/14/13 1400  Gross per 24 hour  Intake 2171.25 ml  Output    575 ml  Net 1596.25 ml   Filed Weights   03/13/13 1626  Weight: 53.524 kg (118 lb)    Exam:   General:  Nad. Dried blood on head.  Cardiovascular: RRR  Respiratory: CTAB  Abdomen: soft, nontender, nondistended, positive bowel sounds.  Musculoskeletal: no clubbing cyanosis or edema.  Data Reviewed: Basic Metabolic Panel:  Recent  Labs Lab 03/13/13 1110 03/14/13 0340  NA 145 149*  K 3.6* 3.8  CL 107 113*  CO2 26 25  GLUCOSE 132* 112*  BUN 30* 33*  CREATININE 0.92 0.93  CALCIUM 9.3 8.3*   Liver Function Tests: No results found for this basename: AST, ALT, ALKPHOS, BILITOT, PROT, ALBUMIN,  in the last 168 hours No results found for this basename: LIPASE, AMYLASE,  in the last 168 hours No results found for this basename: AMMONIA,  in the last 168 hours CBC:  Recent Labs Lab 03/13/13 1110 03/14/13 0340 03/14/13 0927  WBC 5.6 5.3  --   HGB 9.5* 7.3* 7.7*  HCT 28.9* 22.0* 23.4*  MCV 98.3 97.8  --   PLT 147* 111*  --    Cardiac Enzymes: No results found for this basename: CKTOTAL, CKMB, CKMBINDEX, TROPONINI,  in the last 168 hours BNP (last 3 results)  Recent Labs  03/13/13 1920  PROBNP 2235.0*   CBG: No results found for this basename: GLUCAP,  in the last 168 hours  No results found for this or any previous visit (from the past 240 hour(s)).   Studies: No results found.  Scheduled Meds: . amLODipine  5 mg Oral Daily  . atorvastatin  20 mg Oral Daily  . escitalopram  10 mg Oral Daily  . finasteride  5 mg Oral Daily  . gabapentin  100 mg Oral TID  . metoprolol succinate  25 mg Oral Daily  . pantoprazole  40 mg Oral Daily  . tamsulosin  0.4 mg Oral Daily  . vitamin B-12  100 mcg Oral Daily   Continuous Infusions: . dextrose  5 % and 0.45% NaCl 75 mL/hr at 03/14/13 1055    Principal Problem:   Scalp laceration Active Problems:   HYPERLIPIDEMIA   DEPRESSION   HYPERTENSION   CAD   GERD   A-fib   Bleeding   Fall against object   Acute blood loss anemia    Time spent: 8735 MINS    Unity Medical CenterHOMPSON,Eulala Newcombe MD [facility] Pager [phone]. If 7PM-7AM, please contact night-coverage at [email or web address], password [email or web address] 03/14/2013, 5:30 PM  LOS: 1 day

## 2013-03-14 NOTE — Evaluation (Signed)
Physical Therapy Evaluation Patient Details Name: Nathan Mills MRN: [id number] DOB: 01/29/1917 Today's Date: 03/14/2013 Time: 4098-1191 PT Time Calculation (min): 55 min  PT Assessment / Plan / Recommendation History of Present Illness  Pt is a pleasant 80 y/o male, s/p fall w/ scalp laceration and bleeding.  Clinical Impression  Pt generally weak and deconditioned with poor balance.  Pt is adamant that he wants to go home today, however wife stating that she can not continue to care for pt and wife is 52 yrs old and uses a 69WW.  Lengthy discussion with pt, wife, and daughter about need for A and planning for D/C.  Family was unsure they could afford to pay for private duty A and at this time pt is adamant that he will not go to SNF.  At this time SNF is safest D/C option as pt had near fall with PT when pt attempted to sit without getting close enough to chair.  When pt is questioned about near fall he attempts to down play incident and seems to get angry when PT questions his and his wife's safety at home.  Daughter spoke with PT outside of room and is planning to get more family involved to attempt to talk to pt.  If pt does D/C to home, he will need to consider W/C, cushion, HHPT, HHOT, HHAide, HHRN, and HHSW.  Will continue to follow.      PT Assessment  Patient needs continued PT services    Follow Up Recommendations  SNF    Does the patient have the potential to tolerate intense rehabilitation      Barriers to Discharge Decreased caregiver support Wife stating to PT and pt that she is unable to continue to care for pt at home.  pt denies wife needing to help him.      Equipment Recommendations  Wheelchair (measurements PT);Wheelchair cushion (measurements PT)    Recommendations for Other Services     Frequency Min 3X/week    Precautions / Restrictions Precautions Precautions: Fall Precaution Comments: Pt with multiple falls over last year including several falls over last  month per H & P in chart. Restrictions Weight Bearing Restrictions: No   Pertinent Vitals/Pain Denied pain.        Mobility  Bed Mobility General bed mobility comments: Pt up in chair upon OT arrival  Transfers Overall transfer level: Needs assistance Equipment used: Rolling walker (2 wheeled) Transfers: Sit to/from Stand Sit to Stand: Min assist;Mod assist General transfer comment: pt with posterior lean in coming to stand and requires MinA to wt shift forwards.  Repeated transfer x2 with pt blaming the socks for his need of A.  when coming to sit, pt with uncontrolled descent to sitting the first time and when coming to sit after ambulating pt attempts to sit when he is not at chair and needed ModA to pivot hips around to chair to prevent sitting on floor.  When asked about this pt states he was about to make it if PT hadn't intervened.   Ambulation/Gait Ambulation/Gait assistance: Min assist Ambulation Distance (Feet): 100 Feet Assistive device: Rolling walker (2 wheeled) Gait Pattern/deviations: Step-through pattern;Decreased stride length;Trunk flexed;Narrow base of support Gait velocity interpretation: Below normal speed for age/gender General Gait Details: pt remains flexed and leans on RW.  pt unsteady and needs close guarding for safety.      Exercises     PT Diagnosis: Difficulty walking;Generalized weakness  PT Problem List: Decreased strength;Decreased activity tolerance;Decreased balance;Decreased  mobility;Decreased coordination;Decreased knowledge of use of DME;Decreased safety awareness PT Treatment Interventions: DME instruction;Gait training;Stair training;Functional mobility training;Therapeutic activities;Therapeutic exercise;Balance training;Patient/family education     PT Goals(Current goals can be found in the care plan section) Acute Rehab PT Goals Patient Stated Goal: Home! PT Goal Formulation: With patient/family Time For Goal Achievement:  03/28/13 Potential to Achieve Goals: Fair  Visit Information  Last PT Received On: 03/14/13 Assistance Needed: +1 History of Present Illness: Pt is a pleasant 80 y/o male, s/p fall w/ scalp laceration and bleeding.       Prior Functioning  Home Living Family/patient expects to be discharged to:: Private residence Living Arrangements: Spouse/significant other Available Help at Discharge: Family;Available 24 hours/day Type of Home: House Home Access: Stairs to enter Entergy CorporationEntrance Stairs-Number of Steps: 1 Entrance Stairs-Rails: Right;Left Home Layout: One level Home Equipment: Walker - 2 wheels;Shower seat;Grab bars - tub/shower Additional Comments: pt's wife uses 4WW and is 78 yrs old.  One of pt's daughters lives nearby and performs all homemaking tasks for pt and wife.  Daughter and son-in-law also drive for pt and wife.   Prior Function Level of Independence: Needs assistance Gait / Transfers Assistance Needed: Pt ambulates w/ RW and has history of multiple falls iver last year, increasing over last month per chart reivew. ADL's / Homemaking Assistance Needed: Pt states that he has a daughter that lives nearby whom assists w/ cleaning and grocery shopping.  Communication / Swallowing Assistance Needed: Somewhat garbled speech.  Comments: Pt states that his wife is not in good health or able to assist him physically. Communication Communication: HOH Dominant Hand: Right    Cognition  Cognition Arousal/Alertness: Awake/alert Behavior During Therapy: WFL for tasks assessed/performed Overall Cognitive Status: Within Functional Limits for tasks assessed    Extremity/Trunk Assessment Upper Extremity Assessment Upper Extremity Assessment: Defer to OT evaluation Lower Extremity Assessment Lower Extremity Assessment: Generalized weakness Cervical / Trunk Assessment Cervical / Trunk Assessment: Kyphotic   Balance Balance Overall balance assessment: History of Falls;Needs  assistance Sitting-balance support: Feet supported Sitting balance-Leahy Scale: Fair Standing balance support: Bilateral upper extremity supported Standing balance-Leahy Scale: Poor Standing balance comment: pt unable to remain standing without Bil UE support.    End of Session PT - End of Session Equipment Utilized During Treatment: Gait belt Activity Tolerance: Patient tolerated treatment well Patient left: in chair;with call bell/phone within reach;with family/visitor present Nurse Communication: Mobility status  GP Functional Assessment Tool Used: Clinical Judgement Functional Limitation: Mobility: Walking and moving around Mobility: Walking and Moving Around Current Status (Z6109(G8978): At least 20 percent but less than 40 percent impaired, limited or restricted Mobility: Walking and Moving Around Goal Status [id number](G8979): At least 1 percent but less than 20 percent impaired, limited or restricted   Sunny SchleinRitenour, Jaimie Pippins F, [place] CarolinaPT [phone] 03/14/2013, 2:44 PM

## 2013-03-15 DIAGNOSIS — R21 Rash and other nonspecific skin eruption: Secondary | ICD-10-CM

## 2013-03-15 DIAGNOSIS — W1809XA Striking against other object with subsequent fall, initial encounter: Secondary | ICD-10-CM

## 2013-03-15 LAB — CBC
HCT: 20.3 % — ABNORMAL LOW (ref 39.0–52.0)
Hemoglobin: 6.8 g/dL — CL (ref 13.0–17.0)
MCH: 33 pg (ref 26.0–34.0)
MCHC: 33.5 g/dL (ref 30.0–36.0)
MCV: 98.5 fL (ref 78.0–100.0)
PLATELETS: 101 10*3/uL — AB (ref 150–400)
RBC: 2.06 MIL/uL — AB (ref 4.22–5.81)
RDW: 14.5 % (ref 11.5–15.5)
WBC: 6 10*3/uL (ref 4.0–10.5)

## 2013-03-15 LAB — URINE CULTURE
CULTURE: NO GROWTH
Colony Count: NO GROWTH

## 2013-03-15 LAB — BASIC METABOLIC PANEL
BUN: 29 mg/dL — ABNORMAL HIGH (ref 6–23)
CHLORIDE: 108 meq/L (ref 96–112)
CO2: 25 meq/L (ref 19–32)
Calcium: 8.3 mg/dL — ABNORMAL LOW (ref 8.4–10.5)
Creatinine, Ser: 0.97 mg/dL (ref 0.50–1.35)
GFR calc Af Amer: 80 mL/min — ABNORMAL LOW (ref >90)
GFR calc non Af Amer: 69 mL/min — ABNORMAL LOW (ref >90)
Glucose, Bld: 120 mg/dL — ABNORMAL HIGH (ref 70–99)
Potassium: 3.9 mEq/L (ref 3.7–5.3)
Sodium: 145 mEq/L (ref 137–147)

## 2013-03-15 LAB — PREPARE RBC (CROSSMATCH)

## 2013-03-15 LAB — HEMOGLOBIN AND HEMATOCRIT, BLOOD
HCT: 25 % — ABNORMAL LOW (ref 39.0–52.0)
HEMOGLOBIN: 8.4 g/dL — AB (ref 13.0–17.0)

## 2013-03-15 LAB — ABO/RH: ABO/RH(D): A POS

## 2013-03-15 MED ORDER — DIPHENHYDRAMINE HCL 25 MG PO CAPS
50.0000 mg | ORAL_CAPSULE | Freq: Every evening | ORAL | Status: DC | PRN
  Administered 2013-03-16: 50 mg via ORAL
  Filled 2013-03-15: qty 2

## 2013-03-15 NOTE — Progress Notes (Signed)
Physical Therapy Treatment Patient Details Name: EUSEBIO BLAZEJEWSKI MRN: [id number] DOB: 1917/06/19 Today's Date: 03/15/2013 Time: 1324-4010 PT Time Calculation (min): 24 min  PT Assessment / Plan / Recommendation  History of Present Illness Pt is a pleasant 80 y/o male, s/p fall w/ scalp laceration and bleeding.   PT Comments   Patient demonstrates some improvements in mobility and activity tolerance today. Ambulated in hall with RW, performed there-ex as well.  Will continue to see and progress as tolerated.  Follow Up Recommendations  SNF     Does the patient have the potential to tolerate intense rehabilitation     Barriers to Discharge        Equipment Recommendations  Wheelchair (measurements PT);Wheelchair cushion (measurements PT)    Recommendations for Other Services    Frequency Min 3X/week   Progress towards PT Goals Progress towards PT goals: Progressing toward goals  Plan Current plan remains appropriate    Precautions / Restrictions Precautions Precautions: Fall Precaution Comments: Pt with multiple falls over last year including several falls over last month per H & P in chart. Restrictions Weight Bearing Restrictions: No   Pertinent Vitals/Pain No pain reported at this time    Mobility  Bed Mobility Overal bed mobility: Needs Assistance Bed Mobility: Supine to Sit;Sit to Supine Supine to sit: Min assist Sit to supine: Min guard General bed mobility comments: Increased time to perform activity of coming to EOB Transfers Overall transfer level: Needs assistance Equipment used: Rolling walker (2 wheeled) Transfers: Sit to/from Stand Sit to Stand: Min guard General transfer comment: patient required VCs for hand placement and min guard for stability upon standing Ambulation/Gait Ambulation/Gait assistance: Min guard Ambulation Distance (Feet): 420 Feet (with 3 standing rest breaks) Assistive device: Rolling walker (2 wheeled) Gait Pattern/deviations:  Step-through pattern;Decreased stride length;Trunk flexed;Narrow base of support Gait velocity: decreased Gait velocity interpretation: Below normal speed for age/gender General Gait Details: VCs for positioning within RW, cues for upright posture and min guard for stability    Exercises     PT Diagnosis:    PT Problem List:   PT Treatment Interventions:     PT Goals (current goals can now be found in the care plan section) Acute Rehab PT Goals Patient Stated Goal: to go home PT Goal Formulation: With patient/family Time For Goal Achievement: 03/28/13 Potential to Achieve Goals: Fair  Visit Information  Last PT Received On: 03/15/13 Assistance Needed: +1 History of Present Illness: Pt is a pleasant 80 y/o male, s/p fall w/ scalp laceration and bleeding.    Subjective Data  Subjective: I feel pretty good Patient Stated Goal: to go home   Cognition  Cognition Arousal/Alertness: Awake/alert Behavior During Therapy: WFL for tasks assessed/performed Overall Cognitive Status: Within Functional Limits for tasks assessed    Balance  Balance Overall balance assessment: History of Falls Standing balance support: Bilateral upper extremity supported Standing balance-Leahy Scale: Fair General Comments General comments (skin integrity, edema, etc.): had patient demonstrates ther-ex that he performs, patient performed knee flexion with 4 different hip rotation positions in supine on both legs x5, patient also perfomred ankle circles, as well as straight leg raises. Patient verbalized that he was performed abd/add exercises with T-band but that his band broke, provided patient with orange T band to continued general LE strengthening exercises.   End of Session PT - End of Session Equipment Utilized During Treatment: Gait belt Activity Tolerance: Patient tolerated treatment well Patient left: in bed;with call bell/phone within reach;with bed alarm  set;with family/visitor present Nurse  Communication: Mobility status   GP     Fabio AsaWerner, Mattheus Rauls J 03/15/2013, 3:09 PM Charlotte Crumbevon Lovey Crupi, PT DPT  [phone]

## 2013-03-15 NOTE — Clinical Social Work Psychosocial (Signed)
Clinical Social Work Department BRIEF PSYCHOSOCIAL ASSESSMENT 03/15/2013  Patient:  Nathan Mills, Nathan Mills     Account Number:  [id number]     Admit date:  03/13/2013  Clinical Social Worker:  Donna Christen  Date/Time:  03/15/2013 10:13 AM  Referred by:  Physician  Date Referred:  03/15/2013 Referred for  SNF Placement   Other Referral:   none.   Interview type:  Patient Other interview type:   none.    PSYCHOSOCIAL DATA Living Status:  WIFE Admitted from facility:   Level of care:   Primary support name:  Shaurya Rawdon Primary support relationship to patient:  SPOUSE Degree of support available:   strong support.    CURRENT CONCERNS Current Concerns  Post-Acute Placement   Other Concerns:   none.    SOCIAL WORK ASSESSMENT / PLAN CSW met with pt at bedside to discuss discharge disposition. CSW explained CSW role at [facility]. Pt was agreeable to speaking with CSW. Pt stated that he is from home with wife (living in a condo), but is agreeable to SNF placement at time of discharge. Per pt, his son-in-law has two suggestions of SNF placements but pt was unable to recall the SNF names. CSW to fax clinical information to [facility] and continue to follow for SNF placement.   Assessment/plan status:  Psychosocial Support/Ongoing Assessment of Needs Other assessment/ plan:   none.   Information/referral to community resources:   [facility] placement.    PATIENTS/FAMILYS RESPONSE TO PLAN OF CARE: Pt was both understanding and agreeable to CSW plan of care.       Pati Gallo, Lane Social Worker [phone]

## 2013-03-15 NOTE — Progress Notes (Signed)
[facility] PROGRESS NOTE  Nathan Mills [id number] DOB: Aug 17, 1917 DOA: 03/13/2013 PCP:  Duane Lopeoss, Alan, MD  Assessment/Plan: #1 scalp laceration Secondary to falling against an object/multiple falls.hemoglobin is down to 7.3. Repeat hemoglobin 7.2 this a.m.Continue to hold Plavix. -The patient and family declined skilled nursing at this time, his management consult for home health services -Will transfuse 1 unit of packed red blood cells, Plan DC In a.m. if hemoglobin remained stable on recheck.  #2 anemia/acute blood loss anemia -Secondary to #1. Hemoglobin on followup today 7.2(from 9.5 on 3/2) -We'll transfuse a unit of packed red blood cells, follow and recheck  #3 hypertension Stable. Continue Norvasc and metoprolol and Flomax.  #4 hyperlipidemia Continue Lipitor.  #5 coronary artery disease Continue Norvasc, Lipitor, metoprolol. Plavix on hold secondary to problem #1.  #6 atrial fibrillation Metoprolol for rate control. Plavix on hold secondary to problems #1 and 2.  #7 gastroesophageal reflux disease PPI.  #8 history of CVA Plavix on hold secondary to problems #1 and 2. Follow.  #9 DVT prophylaxis SCDs.   Code Status: full Family Communication: Wife and daughter at bedside Disposition Plan: SNF when medically stable.   Consultants:  none  Procedures:  CT head and CT C-spine 03/12/2013    Antibiotics:  none  HPI/Subjective:  Patient denies any further bleeding. Denies chest pain and no shortness of breath. Family at bedside  Objective: Filed Vitals:   03/15/13 1427  BP: 123/43  Pulse: 58  Temp: 98 F (36.7 C)  Resp: 18    Intake/Output Summary (Last 24 hours) at 03/15/13 1859 Last data filed at 03/15/13 1115  Gross per 24 hour  Intake   1220 ml  Output    475 ml  Net    745 ml   Filed Weights   03/13/13 1626  Weight: 53.524 kg (118 lb)    Exam:   General:  Nad. Dried/old blood on head.  Cardiovascular:  RRR  Respiratory: CTAB  Abdomen: soft, nontender, nondistended, positive bowel sounds.  Musculoskeletal: no clubbing cyanosis or edema.  Data Reviewed: Basic Metabolic Panel:  Recent Labs Lab 03/13/13 1110 03/14/13 0340 03/15/13 0345  NA 145 149* 145  K 3.6* 3.8 3.9  CL 107 113* 108  CO2 26 25 25   GLUCOSE 132* 112* 120*  BUN 30* 33* 29*  CREATININE 0.92 0.93 0.97  CALCIUM 9.3 8.3* 8.3*   Liver Function Tests: No results found for this basename: AST, ALT, ALKPHOS, BILITOT, PROT, ALBUMIN,  in the last 168 hours No results found for this basename: LIPASE, AMYLASE,  in the last 168 hours No results found for this basename: AMMONIA,  in the last 168 hours CBC:  Recent Labs Lab 03/13/13 1110 03/14/13 0340 03/14/13 0927 03/15/13 0345 03/15/13 1610  WBC 5.6 5.3  --  6.0  --   HGB 9.5* 7.3* 7.7* 6.8* 8.4*  HCT 28.9* 22.0* 23.4* 20.3* 25.0*  MCV 98.3 97.8  --  98.5  --   PLT 147* 111*  --  101*  --    Cardiac Enzymes: No results found for this basename: CKTOTAL, CKMB, CKMBINDEX, TROPONINI,  in the last 168 hours BNP (last 3 results)  Recent Labs  03/13/13 1920  PROBNP 2235.0*   CBG: No results found for this basename: GLUCAP,  in the last 168 hours  Recent Results (from the past 240 hour(s))  URINE CULTURE     Status: None   Collection Time    03/13/13  6:45 PM  Result Value Ref Range Status   Specimen Description URINE, RANDOM   Final   Special Requests NONE   Final   Culture  Setup Time     Final   Value: 03/13/2013 19:43     Performed at [facility]   [place] Count     Final   Value: NO GROWTH     Performed at [facility]   Culture     Final   Value: NO GROWTH     Performed at [facility]   Report Status 03/15/2013 FINAL   Final     Studies: No results found.  Scheduled Meds: . amLODipine  5 mg Oral Daily  . atorvastatin  20 mg Oral Daily  . escitalopram  10 mg Oral Daily  . finasteride  5 mg Oral Daily  .  gabapentin  100 mg Oral TID  . metoprolol succinate  25 mg Oral Daily  . pantoprazole  40 mg Oral Daily  . tamsulosin  0.4 mg Oral Daily  . vitamin B-12  100 mcg Oral Daily   Continuous Infusions:    Principal Problem:   Scalp laceration Active Problems:   HYPERLIPIDEMIA   DEPRESSION   HYPERTENSION   CAD   GERD   A-fib   Bleeding   Fall against object   Acute blood loss anemia    Time spent: 35 MINS    Kela Millin MD [facility] Pager [phone]. If 7PM-7AM, please contact night-coverage at [email or web address], password [facility] 03/15/2013, 6:59 PM  LOS: 2 days

## 2013-03-15 NOTE — Progress Notes (Signed)
Critical lab result of hgb 6.8  Text paged to md on call,awaiting reply.

## 2013-03-15 NOTE — Clinical Social Work Placement (Addendum)
Clinical Social Work Department CLINICAL SOCIAL WORK PLACEMENT NOTE 03/15/2013  Patient:  Nathan ParadiseHOWELL,[name]  Account Number:  [id number] Admit date:  03/13/2013  Clinical Social Worker:  Irving BurtonEMILY SUM[place], LCSWA  Date/time:  03/15/2013 03:37 PM  Clinical Social Work is seeking post-discharge placement for this patient at the following level of care:   SKILLED NURSING   (*CSW will update this form in Epic as items are completed)   03/15/2013  Patient/family provided with [facility] of Clinical Social Works list of facilities offering this level of care within the geographic area requested by the patient (or if unable, by the patients family).  03/15/2013  Patient/family informed of their freedom to choose among providers that offer the needed level of care, that participate in Medicare, Medicaid or managed care program needed by the patient, have an available bed and are willing to accept the patient.  03/15/2013  Patient/family informed of [facility] ownership interest in [facility], as well as of the fact that they are under no obligation to receive care at this facility.  PASARR submitted to EDS on 03/15/2013 PASARR number received from EDS on 03/15/2013  FL2 transmitted to all facilities in geographic area requested by pt/family on  03/15/2013 FL2 transmitted to all facilities within larger geographic area on   Patient informed that his/her managed care company has contracts with or will negotiate with  certain facilities, including the following:     Patient/family informed of bed offers received:  03/16/2013 [facility] Physician recommends and patient chooses bed at    Patient to be transferred to  on  03/17/2013 [facility]  The following physician request were entered in Epic:   Additional Comments:  Darlyn ChamberEmily [place], Theresia MajorsLCSWA Clinical Social Worker [phone]

## 2013-03-15 NOTE — Progress Notes (Signed)
PT Cancellation Note  Patient Details Name: Nathan Mills MRN: [id number] DOB: 06-16-1917   Cancelled Treatment:    Reason Eval/Treat Not Completed: Medical issues which prohibited therapy. Patient Hgb 6.8. Per RN patient to receive transfusion. Patient has been lethargic this AM per RN. Will follow up as appropriate   Fredrich BirksRobinette, Nessa Ramaker Nathan Mills 03/15/2013, 8:47 AM

## 2013-03-16 DIAGNOSIS — E785 Hyperlipidemia, unspecified: Secondary | ICD-10-CM

## 2013-03-16 LAB — CBC
HCT: 29.6 % — ABNORMAL LOW (ref 39.0–52.0)
Hemoglobin: 9.7 g/dL — ABNORMAL LOW (ref 13.0–17.0)
MCH: 31.4 pg (ref 26.0–34.0)
MCHC: 32.8 g/dL (ref 30.0–36.0)
MCV: 95.8 fL (ref 78.0–100.0)
PLATELETS: 130 10*3/uL — AB (ref 150–400)
RBC: 3.09 MIL/uL — ABNORMAL LOW (ref 4.22–5.81)
RDW: 15.7 % — ABNORMAL HIGH (ref 11.5–15.5)
WBC: 6.7 10*3/uL (ref 4.0–10.5)

## 2013-03-16 LAB — TYPE AND SCREEN
ABO/RH(D): A POS
Antibody Screen: NEGATIVE
Unit division: 0

## 2013-03-16 NOTE — Progress Notes (Signed)
[facility] PROGRESS NOTE  Nathan ParadiseWilliam E Mills ZOX:[id number] DOB: 05-13-1917 DOA: 03/13/2013 PCP:  Duane Lopeoss, Alan, MD  Assessment/Plan: #1 scalp laceration Secondary to falling against an object/multiple falls.hemoglobin is down to 7.3. Hemoglobin improved to 9.7 status post transfusion and 3/5 -Change of mind per family and would like SNF>> awaiting bed.  #2 anemia/acute blood loss anemia -Secondary to #1. Hemoglobin on followup today 7.2(from 9.5 on 3/2) -Hemoglobin improved to 9.7 status post transfusion on 3/5  #3 hypertension Stable. Continue Norvasc and metoprolol and Flomax.  #4 hyperlipidemia Continue Lipitor.  #5 coronary artery disease Continue Norvasc, Lipitor, metoprolol. Plavix on hold secondary to problem #1.  #6 atrial fibrillation Metoprolol for rate control. Plavix on hold secondary to problems #1 and 2.  #7 gastroesophageal reflux disease PPI.  #8 history of CVA Plavix on hold secondary to problems #1 and 2. Follow.  #9 DVT prophylaxis SCDs.   Code Status: full Family Communication: Wife and daughter at bedside Disposition Plan: SNF when medically stable.   Consultants:  none  Procedures:  CT head and CT C-spine 03/12/2013    Antibiotics:  none  HPI/Subjective:  Patient denies any further bleeding. No complaints reported  Objective: Filed Vitals:   03/16/13 1424  BP: 116/51  Pulse: 67  Temp: 97.2 F (36.2 C)  Resp: 20    Intake/Output Summary (Last 24 hours) at 03/16/13 1458 Last data filed at 03/16/13 1426  Gross per 24 hour  Intake    630 ml  Output   1575 ml  Net   -945 ml   Filed Weights   03/13/13 1626  Weight: 53.524 kg (118 lb)    Exam:   General:  Nad. Dried/old blood on head.  Cardiovascular: RRR  Respiratory: CTAB  Abdomen: soft, nontender, nondistended, positive bowel sounds.  Musculoskeletal: no clubbing cyanosis or edema.  Data Reviewed: Basic Metabolic Panel:  Recent Labs Lab 03/13/13 1110  03/14/13 0340 03/15/13 0345  NA 145 149* 145  K 3.6* 3.8 3.9  CL 107 113* 108  CO2 26 25 25   GLUCOSE 132* 112* 120*  BUN 30* 33* 29*  CREATININE 0.92 0.93 0.97  CALCIUM 9.3 8.3* 8.3*   Liver Function Tests: No results found for this basename: AST, ALT, ALKPHOS, BILITOT, PROT, ALBUMIN,  in the last 168 hours No results found for this basename: LIPASE, AMYLASE,  in the last 168 hours No results found for this basename: AMMONIA,  in the last 168 hours CBC:  Recent Labs Lab 03/13/13 1110 03/14/13 0340 03/14/13 0927 03/15/13 0345 03/15/13 1610 03/16/13 1225  WBC 5.6 5.3  --  6.0  --  6.7  HGB 9.5* 7.3* 7.7* 6.8* 8.4* 9.7*  HCT 28.9* 22.0* 23.4* 20.3* 25.0* 29.6*  MCV 98.3 97.8  --  98.5  --  95.8  PLT 147* 111*  --  101*  --  130*   Cardiac Enzymes: No results found for this basename: CKTOTAL, CKMB, CKMBINDEX, TROPONINI,  in the last 168 hours BNP (last 3 results)  Recent Labs  03/13/13 1920  PROBNP 2235.0*   CBG: No results found for this basename: GLUCAP,  in the last 168 hours  Recent Results (from the past 240 hour(s))  URINE CULTURE     Status: None   Collection Time    03/13/13  6:45 PM      Result Value Ref Range Status   Specimen Description URINE, RANDOM   Final   Special Requests NONE   Final   Culture  Setup  Time     Final   Value: 03/13/2013 19:43     Performed at [facility] Count     Final   Value: NO GROWTH     Performed at [facility]   Culture     Final   Value: NO GROWTH     Performed at [facility]   Report Status 03/15/2013 FINAL   Final     Studies: No results found.  Scheduled Meds: . amLODipine  5 mg Oral Daily  . atorvastatin  20 mg Oral Daily  . escitalopram  10 mg Oral Daily  . finasteride  5 mg Oral Daily  . gabapentin  100 mg Oral TID  . metoprolol succinate  25 mg Oral Daily  . pantoprazole  40 mg Oral Daily  . tamsulosin  0.4 mg Oral Daily  . vitamin B-12  100 mcg Oral Daily    Continuous Infusions:    Principal Problem:   Scalp laceration Active Problems:   HYPERLIPIDEMIA   DEPRESSION   HYPERTENSION   CAD   GERD   A-fib   Bleeding   Fall against object   Acute blood loss anemia    Time spent: 25 MINS    Kela Millin MD [facility] Pager [phone]. If 7PM-7AM, please contact night-coverage at [email or web address], password [facility] 03/16/2013, 2:58 PM  LOS: 3 days

## 2013-03-16 NOTE — Progress Notes (Signed)
Occupational Therapy Treatment Patient Details Name: Nathan Mills MRN: [id number] DOB: 05/22/1917 Today's Date: 03/16/2013 Time: 1100-1117 OT Time Calculation (min): 17 min  OT Assessment / Plan / Recommendation  History of present illness Pt is a pleasant 80 y/o male, s/p fall w/ scalp laceration and bleeding.   OT comments  Pt continues to demonstrate deficits in his ability to perform safe transfers related to ADL's/toileting. Strongly recommend short term rehab at SNF, however pt currently refusing. Pt will need 24/7 Assistance and supervision w/ HHOT secondary to h/o falls, decreased awareness of deficits/denial. Will follow acutely.   Follow Up Recommendations  Home health OT;Supervision/Assistance - 24 hour (SNF recommended, however pt/family are declining.)    Barriers to Discharge       Equipment Recommendations  3 in 1 bedside comode    Recommendations for Other Services    Frequency Min 2X/week   Progress towards OT Goals Progress towards OT goals: Progressing toward goals  Plan Discharge plan needs to be updated;Other (comment) (Chart review indicates pt refusing SNF at this time.)    Precautions / Restrictions Precautions Precautions: Fall Precaution Comments: Pt with multiple falls over last year including several falls over last month per H & P in chart. Restrictions Weight Bearing Restrictions: No   Pertinent Vitals/Pain Denies pain    ADL  Grooming: Performed;Supervision/safety;Min guard;Wash/dry hands Where Assessed - Grooming: Unsupported standing Toilet Transfer: Performed;Moderate assistance (Pt required Mod assist prior t ositting on toilet secondary to attempting to sit before properly behind (pt was to left side, not over toilet when attempting to sit), high fall risk. Verbal & tactile cues needed today for safety. ) Toilet Transfer Method: Sit to stand Toilet Transfer Equipment: Comfort height toilet;Grab bars Toileting - Clothing Manipulation and  Hygiene: Performed;Minimal assistance Where Assessed - Toileting Clothing Manipulation and Hygiene: Sit to stand from 3-in-1 or toilet Tub/Shower Transfer Method: Not assessed Equipment Used: Rolling walker Transfers/Ambulation Related to ADLs: Min A for sit to stand from chair, Mod A prior to sitting on toilet secondary to pt attempts to sit when off to left of toilet, high fall risk, decreased safety. Verbal & tactile cues for safety, sequencing and hand positioning. ADL Comments: Pt continues to demonstrate deficits w/ safety awareness and awareness of deficits. Pt was mod A for toilet transfer today secondary to attempting to sit before properly behind it (off to left side), high fall risk. Discussed this w/ pt whom states he plans to go home, declining SNF Rehab. Pt was educated verbally in backing up to chair, toilet, bed etc until he feels both LE's touching to make sure he is fully behind object prior to sitting. Demonstrated this for pt in room and verbalized understanding. Pt will need 24/7 A & HHOT, 3:1 if going home and declining SNF.    OT Diagnosis:    OT Problem List:   OT Treatment Interventions:     OT Goals(current goals can now be found in the care plan section) Acute Rehab OT Goals Patient Stated Goal: to go home  Visit Information  Last OT Received On: 03/16/13 Assistance Needed: +1 History of Present Illness: Pt is a pleasant 80 y/o male, s/p fall w/ scalp laceration and bleeding.    Subjective Data      Prior Functioning       Cognition  Cognition Arousal/Alertness: Awake/alert Behavior During Therapy: WFL for tasks assessed/performed Overall Cognitive Status:  (Decreased awareness of deficits)    Mobility  Bed Mobility Overal bed mobility:  (  Pt up in chair) Transfers Overall transfer level: Needs assistance Equipment used: Rolling walker (2 wheeled) Transfers: Sit to/from Stand Sit to Stand: Min guard General transfer comment: Pt requires vc's for hand  placement & stability upon standing.    Exercises      Balance Balance Overall balance assessment: History of Falls Sitting-balance support: Feet supported Sitting balance-Leahy Scale: Fair Standing balance support: Bilateral upper extremity supported Standing balance-Leahy Scale: Fair  End of Session OT - End of Session Equipment Utilized During Treatment: Rolling walker Activity Tolerance: Patient tolerated treatment well Patient left: in chair;with call bell/phone within reach  GO     Nathan Mills, Nathan Mills 03/16/2013, 11:34 AM

## 2013-03-16 NOTE — Care Management Note (Signed)
    Page 1 of 1   03/16/2013     10:23:06 AM   CARE MANAGEMENT NOTE 03/16/2013  Patient:  Malachi ParadiseHOWELL,Sevastian E   Account Number:  [id number]  Date Initiated:  03/16/2013  Documentation initiated by:  Ronny FlurryWILE,Adonica Fukushima  Subjective/Objective Assessment:     Action/Plan:   Anticipated DC Date:     Anticipated DC Plan:  SKILLED NURSING FACILITY         Choice offered to / List presented to:             Status of service:   Medicare Important Message given?   (If response is "NO", the following Medicare IM given date fields will be blank) Date Medicare IM given:   Date Additional Medicare IM given:    Discharge Disposition:    Per UR Regulation:    If discussed at Long Length of Stay Meetings, dates discussed:    Comments:  03-16-13 Referral for home health . Spoke to patinet , he asked me to call his wife and daughters . Called wife she referred me to daughter Juliette AlcideMelinda cell [phone]. Juliette AlcideMelinda explained patinet recently has had [facility] for home health and believed he was getting stronger with HHPT , however, continued to fall . Read PT note and [facility] and she does want to speak to social worker regarding short term placement . Irving Burtonmily SW will call [facility] . Ronny FlurryHeather Lexany Belknap RN BSN [phone]

## 2013-03-17 ENCOUNTER — Encounter: Payer: Self-pay | Admitting: *Deleted

## 2013-03-17 DIAGNOSIS — F3289 Other specified depressive episodes: Secondary | ICD-10-CM

## 2013-03-17 DIAGNOSIS — F329 Major depressive disorder, single episode, unspecified: Secondary | ICD-10-CM

## 2013-03-17 MED ORDER — CLOPIDOGREL BISULFATE 75 MG PO TABS: 75.0000 mg | ORAL_TABLET | Freq: Every day | ORAL | Status: AC

## 2013-03-17 MED ORDER — DIPHENHYDRAMINE HCL 25 MG PO CAPS: 25.0000 mg | ORAL_CAPSULE | Freq: Every evening | ORAL | Status: DC | PRN

## 2013-03-17 NOTE — Progress Notes (Signed)
Physical Therapy Treatment Patient Details Name: Nathan Mills MRN: [id number] DOB: 1917/04/30 Today's Date: 03/17/2013 Time: 4132-44011106-1123 PT Time Calculation (min): 17 min  PT Assessment / Plan / Recommendation  History of Present Illness Pt is a pleasant 80 y/o male, s/p fall w/ scalp laceration and bleeding.   PT Comments   Patient continues to be motivated to ambulate. Making some progress. Continue to recommend SNF for ongoing Physical Therapy.     Follow Up Recommendations  SNF     Does the patient have the potential to tolerate intense rehabilitation     Barriers to Discharge        Equipment Recommendations  Wheelchair (measurements PT);Wheelchair cushion (measurements PT)    Recommendations for Other Services    Frequency Min 3X/week   Progress towards PT Goals Progress towards PT goals: Progressing toward goals  Plan Current plan remains appropriate    Precautions / Restrictions Precautions Precautions: Fall Precaution Comments: Pt with multiple falls over last year including several falls over last month per H & P in chart.   Pertinent Vitals/Pain Denied pain    Mobility  Bed Mobility Overal bed mobility: Needs Assistance General bed mobility comments: Patient in recliner before and after session.  Transfers Overall transfer level: Needs assistance Equipment used: Rolling walker (2 wheeled) Transfers: Sit to/from [employer]Stand;Stand Pivot Transfers Sit to Stand: Min guard Stand pivot transfers: Min guard General transfer comment: Pt requires vc's for hand placement and increased effort to stand without assistance Ambulation/Gait Ambulation/Gait assistance: Min guard Ambulation Distance (Feet): 300 Feet Assistive device: Rolling walker (2 wheeled) Gait velocity: decreased General Gait Details: VCs for positioning within RW, cues for upright posture and min guard for stability. 2 standing rest breaks with ambulation    Exercises     PT Diagnosis:    PT Problem  List:   PT Treatment Interventions:     PT Goals (current goals can now be found in the care plan section)    Visit Information  Last PT Received On: 03/17/13 Assistance Needed: +1 History of Present Illness: Pt is a pleasant 80 y/o male, s/p fall w/ scalp laceration and bleeding.    Subjective Data      Cognition  Cognition Arousal/Alertness: Awake/alert Behavior During Therapy: WFL for tasks assessed/performed Overall Cognitive Status: Within Functional Limits for tasks assessed    Balance     End of Session PT - End of Session Equipment Utilized During Treatment: Gait belt Activity Tolerance: Patient tolerated treatment well Patient left: in chair;with call bell/phone within reach Nurse Communication: Mobility status   GP     Fredrich BirksRobinette, Julia Elizabeth 03/17/2013, 12:03 PM 03/17/2013 Fredrich Birksobinette, Julia Elizabeth PTA [phone] pager [phone] office

## 2013-03-17 NOTE — Progress Notes (Signed)
Occupational Therapy Treatment Patient Details Name: Nathan Mills MRN: [id number] DOB: 1917/06/25 Today's Date: 03/17/2013 Time: 6010-93230838-0910 OT Time Calculation (min): 32 min  OT Assessment / Plan / Recommendation  History of present illness Pt is a pleasant 80 y/o male, s/p fall w/ scalp laceration and bleeding.   OT comments  Pt. Still requiring cues for hand placement and sequencing for safety during functional transfers.  Able to complete ub/lb dressing with s/cga but overall will need con't. S and assistance if con't. To insist on d/c home. Reports he also cares for his wife, there is concerns with this also given his un steady nature and hx. Of falls.  Follow Up Recommendations  Supervision/Assistance - 24 hour;Home health OT           Equipment Recommendations  3 in 1 bedside comode        Frequency Min 2X/week   Progress towards OT Goals Progress towards OT goals: Progressing toward goals  Plan Discharge plan remains appropriate    Precautions / Restrictions Precautions Precautions: Fall Precaution Comments: Pt with multiple falls over last year including several falls over last month per H & P in chart.   Pertinent Vitals/Pain No c/o pain    ADL  Upper Body Dressing: Performed;Min guard Where Assessed - Upper Body Dressing: Supported sitting Lower Body Dressing: Performed;Supervision/safety Where Assessed - Lower Body Dressing: Supported sitting Toilet Transfer: Automotive engineerimulated Toilet Transfer Method: Stand pivot;Sit to stand Toileting - Clothing Manipulation and Hygiene: Simulated Where Assessed - Toileting Clothing Manipulation and Hygiene: Standing Transfers/Ambulation Related to ADLs: min cues for hand placement and cga for initiation of safe pivotal steps ADL Comments: able to to perform ub/ lb dressing with s/cga with supported sit/stand.  cues for hand placement and sequencing of steps for safety    OT Goals(current goals can now be found in the care plan  section)    Visit Information  Last OT Received On: 03/17/13 History of Present Illness: Pt is a pleasant 80 y/o male, s/p fall w/ scalp laceration and bleeding.                 Cognition  Cognition Arousal/Alertness: Awake/alert Behavior During Therapy: WFL for tasks assessed/performed    Mobility  Bed Mobility Overal bed mobility: Needs Assistance General bed mobility comments: able to bring b les out of bed without assistance but cues for scooting eob prior to sit/stand Transfers Overall transfer level: Needs assistance Equipment used: Rolling walker (2 wheeled) Transfers: Sit to/from [employer]Stand;Stand Pivot Transfers Sit to Stand: Min guard Stand pivot transfers: Min guard General transfer comment: Pt requires vc's for hand placement & stability upon standing.              End of Session OT - End of Session Equipment Utilized During Treatment: Rolling walker Activity Tolerance: Patient tolerated treatment well Patient left: in chair;with call bell/phone within reach    Robet LeuMorris, Neil Brickell Lorraine, COTA/L 03/17/2013, 9:36 AM

## 2013-03-17 NOTE — Clinical Social Work Note (Signed)
[facility]. [name] with [facility]. [facility]. CSW has spoken to [facility]ison [facility] who stated pt is [facility]. CSW completing discharge packet and to place on pt's shadow chart.  CSW has spoken to RN regarding information above. RN has been given number for report.  [address], [phone] [facility] (ambulance) [phone]  Darlyn ChamberEmily [place], [facility] Clinical Social Worker [phone]

## 2013-03-17 NOTE — Progress Notes (Signed)
Report given to LIZ, A, RN at [facility].

## 2013-03-17 NOTE — Discharge Summary (Signed)
Physician Discharge Summary  Nathan Mills WUJ:[id number] DOB: 05/04/1917 DOA: 03/13/2013  PCP:  Duane Lope, MD  Admit date: 03/13/2013 Discharge date: 03/17/2013  Time spent: >30 minutes Follow-up Information   Please follow up. (SNF MD in 1-2days)       Follow up with Olga Millers, MD. (in 1week, call for appt upon discharge)    Specialty:  Cardiology   Contact information:   [address] [place] [address] [phone]        Discharge Diagnoses:  Principal Problem:   Scalp laceration Active Problems:   HYPERLIPIDEMIA   DEPRESSION   HYPERTENSION   CAD   GERD   A-fib   Bleeding   Fall against object   Acute blood loss anemia   Discharge Condition: Improved/stable  Diet recommendation: Low sodium heart healthy  Filed Weights   03/13/13 1626  Weight: 53.524 kg (118 lb)    History of present illness:  Patient is a 80 year old male with history of CAD, syncope, GERD, CVA, atrial fibrillation on Plavix, had initially come to the ER yesterday with mechanical fall and hitting his head. History was obtained from the patient, his wife and the daughter in the room. Patient has had multiple falls recently (per daughter about 'dozen' in last year, and at least 3 times in the last 1 month). Patient usually ambulates with a walker. Yesterday at night, he was coming from the bathroom and was dark in the room when he stumbled upon a furniture in the bedroom and hit his head. Patient got up himself but he continued to bleed from his scalp. EMS was called, patient was seen by Dr. Jeraldine Loots, staples were placed on the laceration by EDP, Dr. Jeraldine Loots. Home health was arranged from the ER and patient was discharged. Patient however continued to bleed at home again whole night. Per his wife, his pillow was soaking with blood.  In the ED, multiple bloodsoaked bandages were removed and lots of thick blood clots were removed. Overnight observation admission was requested. At  the time of my encounter, active bleeding has stopped, there is significant amount of dried blood on the head. He denied any chest pain, dizziness, lightheadedness or syncopal episode.   Hospital Course:  #1 scalp laceration -Secondary to falling against an object/multiple falls.hemoglobin was monitored and dropped down down to 7.2 . She was transfused  packed red blood cells and hemoglobin improved to 9.7 status post transfusion on 3/5  -Patient was seen in the ED and the laceration sutured with staples on 3/1>> followup nursing facility. He has had no further bleeding in the hospital. -PT OT was consulted full patient and recommended SNF -Change of mind per family and would like SNF>> awaiting bed.  #2 anemia/acute blood loss anemia  -Secondary to #1. Hemoglobin on followup today 7.2(from 9.5 on 3/2)  -Hemoglobin improved to 9.7 status post transfusion on 3/5  #3 hypertension  Stable. Continue Norvasc and metoprolol and Flomax.  #4 hyperlipidemia  Continue Lipitor.  #5 coronary artery disease  Continue Norvasc, Lipitor, metoprolol. Plavix was held secondary to the bleeding and recommendation is to resume it on 3/8 nursing facility #6 atrial fibrillation  Metoprolol for rate control. Plavix was held as above and to be resumed on 3/8. His to followup with [name] outpatient #7 gastroesophageal reflux disease  PPI.  #8 history of CVA  As above Plavix was on hold secondary to #1, to be resumed on 3/8 as above  Exam:  General: Nad.  Dried/old blood on head.  Cardiovascular: RRR  Respiratory: CTAB  Abdomen: soft, nontender, nondistended, positive bowel sounds.  Musculoskeletal: no clubbing cyanosis or edema.    Procedures:  none  Consultations:  none  Discharge Exam: Filed Vitals:   03/17/13 0922  BP: 102/40  Pulse: 56  Temp:   Resp:       Discharge Instructions  Discharge Orders   Future Orders Complete By Expires   Diet - low sodium heart healthy  As  directed    Increase activity slowly  As directed        Medication List    STOP taking these medications       omeprazole 10 MG capsule  Commonly known as:  PRILOSEC      TAKE these medications       amLODipine-atorvastatin 5-20 MG per tablet  Commonly known as:  CADUET  Take 1 tablet by mouth daily.     clopidogrel 75 MG tablet  Commonly known as:  PLAVIX  Take 1 tablet (75 mg total) by mouth daily.  Start taking on:  03/19/2013     diphenhydrAMINE 25 mg capsule  Commonly known as:  BENADRYL  Take 1 capsule (25 mg total) by mouth at bedtime as needed for sleep.     ergocalciferol 50000 UNITS capsule  Commonly known as:  VITAMIN D2  Take 50,000 Units by mouth once a week.     escitalopram 10 MG tablet  Commonly known as:  LEXAPRO  Take 10 mg by mouth daily.     finasteride 5 MG tablet  Commonly known as:  PROSCAR  Take 5 mg by mouth daily.     gabapentin 100 MG capsule  Commonly known as:  NEURONTIN  Take 100 mg by mouth 3 (three) times daily.     metoprolol succinate 25 MG 24 hr tablet  Commonly known as:  TOPROL-XL  Take 25 mg by mouth daily.     tamsulosin 0.4 MG Caps capsule  Commonly known as:  FLOMAX  Take 1 capsule by mouth daily.     VITAMIN B12 PO  Take 1 tablet by mouth daily.       No Known Allergies    The results of significant diagnostics from this hospitalization (including imaging, microbiology, ancillary and laboratory) are listed below for reference.    Significant Diagnostic Studies: Ct Head Wo Contrast  03/12/2013   CLINICAL DATA:  Fall, right side laceration  EXAM: CT HEAD WITHOUT CONTRAST  CT CERVICAL SPINE WITHOUT CONTRAST  TECHNIQUE: Multidetector CT imaging of the head and cervical spine was performed following the standard protocol without intravenous contrast. Multiplanar CT image reconstructions of the cervical spine were also generated.  COMPARISON:  09/16/2009  FINDINGS: CT HEAD FINDINGS  No skull fracture is noted. There is  scalp swelling in right parietal region high convexity. Probable laceration in right posterior parietal scalp axial image 30.  No intracranial hemorrhage, mass effect or midline shift. Stable cerebral atrophy. Again noted periventricular and patchy subcortical chronic white matter disease.  No acute cortical infarction. No mass lesion is noted on this unenhanced scan.  CT CERVICAL SPINE FINDINGS  Axial images of the cervical spine shows no acute fracture or subluxation. There is bilateral apical scarring right greater than left. No pneumothorax noted in visualized lung apices.  Computer processed images shows no acute fracture or subluxation. Extensive degenerative changes are noted C1-C2 articulation. There is significant disc space flattening with mild anterior and mild posterior spurring at C5-C6  level. Mild disc space flattening with mild anterior and posterior spurring at C6-C7 level. No prevertebral soft tissue swelling. Cervical airway is patent. Mild posterior spurring at C4-C5 level.  IMPRESSION: 1. No acute intracranial abnormality. Stable atrophy and chronic white matter disease. There is scalp swelling in right parietal region. Probable scalp laceration right posterior parietal region. 2. No cervical spine acute fracture or subluxation. Degenerative changes as described above.   Electronically Signed   By: Natasha Mead M.D.   On: 03/12/2013 11:10   Ct Cervical Spine Wo Contrast  03/12/2013   CLINICAL DATA:  Fall, right side laceration  EXAM: CT HEAD WITHOUT CONTRAST  CT CERVICAL SPINE WITHOUT CONTRAST  TECHNIQUE: Multidetector CT imaging of the head and cervical spine was performed following the standard protocol without intravenous contrast. Multiplanar CT image reconstructions of the cervical spine were also generated.  COMPARISON:  09/16/2009  FINDINGS: CT HEAD FINDINGS  No skull fracture is noted. There is scalp swelling in right parietal region high convexity. Probable laceration in right posterior  parietal scalp axial image 30.  No intracranial hemorrhage, mass effect or midline shift. Stable cerebral atrophy. Again noted periventricular and patchy subcortical chronic white matter disease.  No acute cortical infarction. No mass lesion is noted on this unenhanced scan.  CT CERVICAL SPINE FINDINGS  Axial images of the cervical spine shows no acute fracture or subluxation. There is bilateral apical scarring right greater than left. No pneumothorax noted in visualized lung apices.  Computer processed images shows no acute fracture or subluxation. Extensive degenerative changes are noted C1-C2 articulation. There is significant disc space flattening with mild anterior and mild posterior spurring at C5-C6 level. Mild disc space flattening with mild anterior and posterior spurring at C6-C7 level. No prevertebral soft tissue swelling. Cervical airway is patent. Mild posterior spurring at C4-C5 level.  IMPRESSION: 1. No acute intracranial abnormality. Stable atrophy and chronic white matter disease. There is scalp swelling in right parietal region. Probable scalp laceration right posterior parietal region. 2. No cervical spine acute fracture or subluxation. Degenerative changes as described above.   Electronically Signed   By: Natasha Mead M.D.   On: 03/12/2013 11:10    Microbiology: Recent Results (from the past 240 hour(s))  URINE CULTURE     Status: None   Collection Time    03/13/13  6:45 PM      Result Value Ref Range Status   Specimen Description URINE, RANDOM   Final   Special Requests NONE   Final   Culture  Setup Time     Final   Value: 03/13/2013 19:43     Performed at [facility]   Colony Count     Final   Value: NO GROWTH     Performed at [facility]   Culture     Final   Value: NO GROWTH     Performed at [facility]   Report Status 03/15/2013 FINAL   Final     Labs: Basic Metabolic Panel:  Recent Labs Lab 03/13/13 1110 03/14/13 0340 03/15/13 0345  NA  145 149* 145  K 3.6* 3.8 3.9  CL 107 113* 108  CO2 26 25 25   GLUCOSE 132* 112* 120*  BUN 30* 33* 29*  CREATININE 0.92 0.93 0.97  CALCIUM 9.3 8.3* 8.3*   Liver Function Tests: No results found for this basename: AST, ALT, ALKPHOS, BILITOT, PROT, ALBUMIN,  in the last 168 hours No results found for this basename: LIPASE, AMYLASE,  in the last 168 hours No results found for this basename: AMMONIA,  in the last 168 hours CBC:  Recent Labs Lab 03/13/13 1110 03/14/13 0340 03/14/13 0927 03/15/13 0345 03/15/13 1610 03/16/13 1225  WBC 5.6 5.3  --  6.0  --  6.7  HGB 9.5* 7.3* 7.7* 6.8* 8.4* 9.7*  HCT 28.9* 22.0* 23.4* 20.3* 25.0* 29.6*  MCV 98.3 97.8  --  98.5  --  95.8  PLT 147* 111*  --  101*  --  130*   Cardiac Enzymes: No results found for this basename: CKTOTAL, CKMB, CKMBINDEX, TROPONINI,  in the last 168 hours BNP: BNP (last 3 results)  Recent Labs  03/13/13 1920  PROBNP 2235.0*   CBG: No results found for this basename: GLUCAP,  in the last 168 hours     Signed:  Priscila Bean C  [facility] 03/17/2013, 11:06 AM

## 2013-03-17 NOTE — Progress Notes (Signed)
Discharge to [facility] transported by family . Patient is alert and oriented , not in any distress upon transfer, Transfer packet given to daughter.

## 2013-03-22 ENCOUNTER — Non-Acute Institutional Stay (SKILLED_NURSING_FACILITY): Payer: Medicare Other | Admitting: Adult Health

## 2013-03-22 DIAGNOSIS — I679 Cerebrovascular disease, unspecified: Secondary | ICD-10-CM

## 2013-03-22 DIAGNOSIS — E785 Hyperlipidemia, unspecified: Secondary | ICD-10-CM

## 2013-03-22 DIAGNOSIS — I4891 Unspecified atrial fibrillation: Secondary | ICD-10-CM

## 2013-03-22 DIAGNOSIS — I10 Essential (primary) hypertension: Secondary | ICD-10-CM

## 2013-03-22 DIAGNOSIS — I2510 Atherosclerotic heart disease of native coronary artery without angina pectoris: Secondary | ICD-10-CM

## 2013-03-22 DIAGNOSIS — S0100XA Unspecified open wound of scalp, initial encounter: Secondary | ICD-10-CM

## 2013-03-22 DIAGNOSIS — F3289 Other specified depressive episodes: Secondary | ICD-10-CM

## 2013-03-22 DIAGNOSIS — D62 Acute posthemorrhagic anemia: Secondary | ICD-10-CM

## 2013-03-22 DIAGNOSIS — K219 Gastro-esophageal reflux disease without esophagitis: Secondary | ICD-10-CM

## 2013-03-22 DIAGNOSIS — F329 Major depressive disorder, single episode, unspecified: Secondary | ICD-10-CM

## 2013-03-22 DIAGNOSIS — G500 Trigeminal neuralgia: Secondary | ICD-10-CM

## 2013-03-22 DIAGNOSIS — S0101XA Laceration without foreign body of scalp, initial encounter: Secondary | ICD-10-CM

## 2013-03-25 ENCOUNTER — Non-Acute Institutional Stay (SKILLED_NURSING_FACILITY): Payer: Medicare Other | Admitting: Internal Medicine

## 2013-03-25 ENCOUNTER — Encounter: Payer: Self-pay | Admitting: Internal Medicine

## 2013-03-25 DIAGNOSIS — I10 Essential (primary) hypertension: Secondary | ICD-10-CM

## 2013-03-25 DIAGNOSIS — I4891 Unspecified atrial fibrillation: Secondary | ICD-10-CM

## 2013-03-25 DIAGNOSIS — S0101XA Laceration without foreign body of scalp, initial encounter: Secondary | ICD-10-CM

## 2013-03-25 DIAGNOSIS — D62 Acute posthemorrhagic anemia: Secondary | ICD-10-CM

## 2013-03-25 DIAGNOSIS — G500 Trigeminal neuralgia: Secondary | ICD-10-CM

## 2013-03-25 DIAGNOSIS — I2510 Atherosclerotic heart disease of native coronary artery without angina pectoris: Secondary | ICD-10-CM

## 2013-03-25 DIAGNOSIS — I679 Cerebrovascular disease, unspecified: Secondary | ICD-10-CM

## 2013-03-25 DIAGNOSIS — S0100XA Unspecified open wound of scalp, initial encounter: Secondary | ICD-10-CM

## 2013-03-25 DIAGNOSIS — E785 Hyperlipidemia, unspecified: Secondary | ICD-10-CM

## 2013-03-25 DIAGNOSIS — F3289 Other specified depressive episodes: Secondary | ICD-10-CM

## 2013-03-25 DIAGNOSIS — F329 Major depressive disorder, single episode, unspecified: Secondary | ICD-10-CM

## 2013-03-25 NOTE — Assessment & Plan Note (Signed)
Rate controlled with toprol and prophylaxed with plavix starting again on 3/8

## 2013-03-25 NOTE — Assessment & Plan Note (Signed)
Continue neurontin TID

## 2013-03-25 NOTE — Assessment & Plan Note (Signed)
Continue lexapro  ?

## 2013-03-25 NOTE — Assessment & Plan Note (Signed)
Prophlaxed with plavix which will restart on 3/8

## 2013-03-25 NOTE — Assessment & Plan Note (Signed)
Continue toprol and norvasc

## 2013-03-25 NOTE — Assessment & Plan Note (Addendum)
From a fall in the middle of the night; scalp lac was sutured 3/1 because it wouldn't stop bleeding; pt was overnight obs then admitted to SNF; sutures and staple can be removed 3/14

## 2013-03-25 NOTE — Assessment & Plan Note (Signed)
Continue atrorvastatin in the form of caduet

## 2013-03-25 NOTE — Assessment & Plan Note (Signed)
From scalp lac;Hb dropped to 6.8 and pt was tx with 1 unit PRBC and was 9.7 post transfusion;will monitor

## 2013-03-25 NOTE — Progress Notes (Signed)
MRN: [id number] Name: Nathan Mills  Sex: male Age: 80 y.o. DOB: 1917/09/10  PSC #: [facility] Facility/Room: 807 Level Of Care: SNF Provider: Merrilee Seashore D Emergency Contacts: Extended Emergency Contact Information Primary Emergency Contact: [name] Address: [address] [place], [place] Home Phone: [phone] Relation: Spouse Secondary Emergency Contact: [name],[name] Address: [address]          [place], [place] [address] [place] Phone: [phone] Relation: Daughter  Code Status: FULL  Allergies: Review of patient's allergies indicates no known allergies.  Chief Complaint  Patient presents with  . nursing home admission    HPI: Patient is 80 y.o. male who fell and sustained a scalp lac that kept bleeding, with significant blood loss secondarily who is admitted to SNF for OT/PT to improve his fall risk.  Past Medical History  Diagnosis Date  . CAD (coronary artery disease)     a. s/p remote MI 56  . Syncope   . TIA (transient ischemic attack)   . Zenker's diverticulum   . GERD (gastroesophageal reflux disease)   . Depression   . Nephrolithiasis   . Hyperlipidemia   . HTN (hypertension)   . Atrial fibrillation     a. Dx 04/2011  . Gallstones   . COPD (chronic obstructive pulmonary disease)   . Peptic stricture of esophagus   . CVA (cerebral infarction)   . Gastric polyp   . Bleeding   . Fall against object   . Acute blood loss anemia   . Syncope   . Hiatal hernia   . Trigeminal neuralgia   . Esophageal dysmotility   . Hiatal hernia     Past Surgical History  Procedure Laterality Date  . Laparoscopic cholecystectomy    . Implantation of a medtronic implantation loop recorder  03/01/2006    Dr. Ladona Ridgel      Medication List       This list is accurate as of: 03/25/13  4:31 PM.  Always use your most recent med list.               acetaminophen 650 MG CR tablet   Commonly known as:  TYLENOL  Take 1 every 6 hours by mouth as needed for mild pain or Fever ./= 101     amLODipine-atorvastatin 5-20 MG per tablet  Commonly known as:  CADUET  Take 1 tablet by mouth daily.     clopidogrel 75 MG tablet  Commonly known as:  PLAVIX  Take 1 tablet (75 mg total) by mouth daily.     ergocalciferol 50000 UNITS capsule  Commonly known as:  VITAMIN D2  Take 50,000 Units by mouth once a week.     escitalopram 10 MG tablet  Commonly known as:  LEXAPRO  Take 10 mg by mouth daily.     finasteride 5 MG tablet  Commonly known as:  PROSCAR  Take 5 mg by mouth daily.     gabapentin 100 MG capsule  Commonly known as:  NEURONTIN  Take 100 mg by mouth 3 (three) times daily.     metoprolol succinate 25 MG 24 hr tablet  Commonly known as:  TOPROL-XL  Take 25 mg by mouth daily.     tamsulosin 0.4 MG Caps capsule  Commonly known as:  FLOMAX  Take 1 capsule by mouth daily.     VITAMIN B12 PO  Take 1 tablet  by mouth daily.        No orders of the defined types were placed in this encounter.     There is no immunization history on file for this patient.  History  Substance Use Topics  . Smoking status: Former Smoker    Quit date: 05/07/1961  . Smokeless tobacco: Never Used  . Alcohol Use: No    Family history is noncontributory    Review of Systems  DATA OBTAINED: from patient; Pt has no c/o GENERAL: Feels well no fevers, fatigue, appetite changes SKIN: No itching, rash or wounds EYES: No eye pain, redness, discharge EARS: No earache, tinnitus, change in hearing NOSE: No congestion, drainage or bleeding  MOUTH/THROAT: No mouth or tooth pain, No sore throat RESPIRATORY: No cough, wheezing, SOB CARDIAC: No chest pain, palpitations, lower extremity edema  GI: No abdominal pain, No N/V/D or constipation, No heartburn or reflux  GU: No dysuria, frequency or urgency, or incontinence  MUSCULOSKELETAL: No unrelieved bone/joint pain NEUROLOGIC:  No headache, dizziness or focal weakness PSYCHIATRIC: No overt anxiety or sadness. Sleeps well. No behavior issue.   Filed Vitals:   03/25/13 1537  BP: 127/49  Pulse: 62  Temp: 96.8 F (36 C)  Resp: 18    Physical Exam  GENERAL APPEARANCE: Alert, conversant. Appropriately groomed. No acute distress.  SKIN: No diaphoresis rash; L scalp laceration healing without infection HEAD: Normocephalic, atraumatic  EYES: Conjunctiva/lids clear. Pupils round, reactive. EOMs intact.  EARS: External exam WNL, canals clear. Hearing grossly normal.  NOSE: No deformity or discharge.  MOUTH/THROAT: Lips w/o lesions  RESPIRATORY: Breathing is even, unlabored. Lung sounds are clear   CARDIOVASCULAR: Heart RRR no murmurs, rubs or gallops. No peripheral edema. GASTROINTESTINAL: Abdomen is soft, non-tender, not distended w/ normal bowel sounds. GENITOURINARY: Bladder non tender, not distended  MUSCULOSKELETAL: No abnormal joints or musculature NEUROLOGIC: Oriented X3. Cranial nerves 2-12 grossly intact. Moves all extremities no tremor. PSYCHIATRIC: Mood and affect appropriate to situation, no behavioral issues  Patient Active Problem List   Diagnosis Date Noted  . Trigeminal neuralgia   . Acute blood loss anemia 03/14/2013  . Bleeding 03/13/2013  . Scalp laceration 03/13/2013  . Fall against object 03/13/2013  . Pulmonary fibrosis 05/30/2011  . Mitral regurgitation 05/18/2011  . Volume overload 05/18/2011  . A-fib 05/08/2011  . SKIN RASH 02/25/2010  . CAROTID ARTERY DISEASE 11/22/2009  . CEREBROVASCULAR DISEASE 12/24/2008  . ESOPHAGEAL STRICTURE 12/24/2008  . DYSPHAGIA 12/24/2008  . HYPERLIPIDEMIA 09/29/2008  . DEPRESSION 09/29/2008  . HYPERTENSION 09/29/2008  . CAD 09/29/2008  . TIA 09/29/2008  . ZENKER'S DIVERTICULUM 09/29/2008  . GERD 09/29/2008  . HIATAL HERNIA 09/29/2008  . NEPHROLITHIASIS 09/29/2008  . SYNCOPE 09/29/2008    CBC    Component Value Date/Time   WBC 6.7 03/16/2013  1225   RBC 3.09* 03/16/2013 1225   HGB 9.7* 03/16/2013 1225   HCT 29.6* 03/16/2013 1225   PLT 130* 03/16/2013 1225   MCV 95.8 03/16/2013 1225   LYMPHSABS 1.4 10/21/2011 1709   MONOABS 0.2 10/21/2011 1709   EOSABS 0.3 10/21/2011 1709   BASOSABS 0.0 10/21/2011 1709    CMP     Component Value Date/Time   NA 145 03/15/2013 0345   K 3.9 03/15/2013 0345   CL 108 03/15/2013 0345   CO2 25 03/15/2013 0345   GLUCOSE 120* 03/15/2013 0345   BUN 29* 03/15/2013 0345   CREATININE 0.97 03/15/2013 0345   CALCIUM 8.3* 03/15/2013 0345   PROT 6.8 07/01/2012 0845  ALBUMIN 3.3* 07/01/2012 0845   AST 14 07/01/2012 0845   ALT 13 07/01/2012 0845   ALKPHOS 66 07/01/2012 0845   BILITOT 0.7 07/01/2012 0845   GFRNONAA 69* 03/15/2013 0345   GFRAA 80* 03/15/2013 0345    Assessment and Plan  Scalp laceration From a fall in the middle of the night; scalp lac was sutured 3/1 because it wouldn't stop bleeding; pt was overnight obs then admitted to SNF; sutures and staple can be removed 3/14  Acute blood loss anemia From scalp lac;Hb dropped to 6.8 and pt was tx with 1 unit PRBC and was 9.7 post transfusion;will monitor  HYPERTENSION Continue toprol and norvasc  CAD Continue toprol, statin and plavix will restart on 3/8  A-fib Rate controlled with toprol and prophylaxed with plavix starting again on 3/8  CEREBROVASCULAR DISEASE Prophlaxed with plavix which will restart on 3/8  DEPRESSION Continue lexapro  HYPERLIPIDEMIA Continue atrorvastatin in the form of caduet  Trigeminal neuralgia Continue neurontin TID    Margit Hanks, MD

## 2013-03-25 NOTE — Assessment & Plan Note (Signed)
Continue toprol, statin and plavix will restart on 3/8

## 2013-03-28 ENCOUNTER — Non-Acute Institutional Stay (SKILLED_NURSING_FACILITY): Payer: Medicare Other | Admitting: Adult Health

## 2013-03-28 ENCOUNTER — Encounter: Payer: Self-pay | Admitting: Adult Health

## 2013-03-28 DIAGNOSIS — I10 Essential (primary) hypertension: Secondary | ICD-10-CM

## 2013-03-28 DIAGNOSIS — K219 Gastro-esophageal reflux disease without esophagitis: Secondary | ICD-10-CM

## 2013-03-28 DIAGNOSIS — E785 Hyperlipidemia, unspecified: Secondary | ICD-10-CM

## 2013-03-28 DIAGNOSIS — D62 Acute posthemorrhagic anemia: Secondary | ICD-10-CM

## 2013-03-28 DIAGNOSIS — S0101XA Laceration without foreign body of scalp, initial encounter: Secondary | ICD-10-CM

## 2013-03-28 DIAGNOSIS — I2510 Atherosclerotic heart disease of native coronary artery without angina pectoris: Secondary | ICD-10-CM

## 2013-03-28 DIAGNOSIS — I679 Cerebrovascular disease, unspecified: Secondary | ICD-10-CM

## 2013-03-28 DIAGNOSIS — I4891 Unspecified atrial fibrillation: Secondary | ICD-10-CM

## 2013-03-28 DIAGNOSIS — F329 Major depressive disorder, single episode, unspecified: Secondary | ICD-10-CM

## 2013-03-28 DIAGNOSIS — F3289 Other specified depressive episodes: Secondary | ICD-10-CM

## 2013-03-28 DIAGNOSIS — S0100XA Unspecified open wound of scalp, initial encounter: Secondary | ICD-10-CM

## 2013-03-28 DIAGNOSIS — G500 Trigeminal neuralgia: Secondary | ICD-10-CM

## 2013-03-28 NOTE — Progress Notes (Signed)
Patient ID: Nathan Mills, male   DOB: May 07, 1917, 80 y.o.   MRN: [id number]               PROGRESS NOTE  DATE: 03/22/2013  FACILITY: Nursing Home Location: [facility]  LEVEL OF CARE: SNF (31)  Acute Visit  CHIEF COMPLAINT:  Follow-up Hospitalization  HISTORY OF PRESENT ILLNESS: This is a 80 year old male who has been admitted to [facility] on 03/18/10 from [facility]. He fell at home and sustained a scalp laceration. Staples were place ad was discharged home. Patient continued to bleed at home and went back to ED. He is now admitted to [facility] for a short-term rehabilitation.  REASSESSMENT OF ONGOING PROBLEM(S):  HTN: Pt 's HTN remains stable.  Denies CP, sob, DOE, pedal edema, headaches, dizziness or visual disturbances.  No complications from the medications currently being used.  Last BP : 142/66  CAD: The angina has been stable. The patient denies dyspnea on exertion, orthopnea, pedal edema, palpitations and paroxysmal nocturnal dyspnea. No complications noted from the medication presently being used.  ATRIAL FIBRILLATION: the patients atrial fibrillation remains stable.  The patient denies DOE, tachycardia, orthopnea, transient neurological sx, pedal edema, palpitations, & PNDs.  No complications noted from the medications currently being used.  PAST MEDICAL HISTORY : Reviewed.  No changes.  CURRENT MEDICATIONS: Reviewed per [facility]  REVIEW OF SYSTEMS:  GENERAL: no change in appetite, no fatigue, no weight changes, no fever, chills or weakness RESPIRATORY: no cough, SOB, DOE, wheezing, hemoptysis CARDIAC: no chest pain, edema or palpitations GI: no abdominal pain, diarrhea, constipation, heart burn, nausea or vomiting  PHYSICAL EXAMINATION  GENERAL: no acute distress, normal body habitus SKIN: scalp laceration with staples and sutures on right scalp is dry, no redness noted EYES: conjunctivae normal, sclerae normal, normal eye lids NECK:  supple, trachea midline, no neck masses, no thyroid tenderness, no thyromegaly LYMPHATICS: no LAN in the neck, no supraclavicular LAN RESPIRATORY: breathing is even & unlabored, BS CTAB CARDIAC: RRR, no murmur,no extra heart sounds, no edema GI: abdomen soft, normal BS, no masses, no tenderness, no hepatomegaly, no splenomegaly EXTREMITIES: able to move all 4 extremities PSYCHIATRIC: the patient is alert & oriented to person, affect & behavior appropriate  LABS/RADIOLOGY: Labs reviewed: Basic Metabolic Panel:  Recent Labs  40/98/11 1110 03/14/13 0340 03/15/13 0345  NA 145 149* 145  K 3.6* 3.8 3.9  CL 107 113* 108  CO2 26 25 25   GLUCOSE 132* 112* 120*  BUN 30* 33* 29*  CREATININE 0.92 0.93 0.97  CALCIUM 9.3 8.3* 8.3*   Liver Function Tests:  Recent Labs  07/01/12 0845  AST 14  ALT 13  ALKPHOS 66  BILITOT 0.7  PROT 6.8  ALBUMIN 3.3*    CBC:  Recent Labs  03/14/13 0340  03/15/13 0345 03/15/13 1610 03/16/13 1225  WBC 5.3  --  6.0  --  6.7  HGB 7.3*  < > 6.8* 8.4* 9.7*  HCT 22.0*  < > 20.3* 25.0* 29.6*  MCV 97.8  --  98.5  --  95.8  PLT 111*  --  101*  --  130*  < > = values in this interval not displayed.   Lipid Panel:  Recent Labs  07/01/12 0845  HDL 35.00*    ASSESSMENT/PLAN:  Scalp laceration with staples and sutures - monitor for bleeding Hypertension - well controlled; continue with Norvasc and Caduet Hyperlipidemia - continue Lipitor CAD - Plavix restarted on 03/19/13  Atrial fibrillation - rate controlled; continue metoprolol GERD - continue Prilosec History of CVA - continue Plavix Depression - continue Lexapro Trigeminal Neuralgia - continue Nerontin Anemia - check CBC and BMP      CPT CODE: [id number]  Nathan Mills - NP [facility] [phone]

## 2013-03-28 NOTE — Progress Notes (Signed)
Patient ID: Nathan Mills, male   DOB: [birth year], 80 y.o.   MRN: [id number]                 PROGRESS NOTE  DATE: 03/28/13  FACILITY: Nursing Home Location: [facility]  LEVEL OF CARE: SNF (31)  Acute Visit  CHIEF COMPLAINT:  Discharge Notes  HISTORY OF PRESENT ILLNESS: This is a 80 year old male who is for discharge home with Home health OT, PT and Nursing. He has been [facility] 03[facility]18[facility]10 [facility]. He fell at home and sustained a scalp laceration. Staples were place ad was discharged home. Patient continued to bleed at home and went back to ED. Patient was admitted to this facility for short-term rehabilitation after the patient's recent hospitalization.  Patient has completed SNF rehabilitation and therapy has cleared the patient for discharge.   REASSESSMENT OF ONGOING PROBLEM(S):  HTN: Pt 's HTN remains stable.  Denies CP, sob, DOE, pedal edema, headaches, dizziness or visual disturbances.  No complications from the medications currently being used.  Last BP : 126/58  GERD: pt's GERD is stable.  Denies ongoing heartburn, abd. Pain, nausea or vomiting.  Currently on a PPI & tolerates it without any adverse reactions.  DEPRESSION: The depression remains stable. Patient denies ongoing feelings of sadness, insomnia, anedhonia or lack of appetite. No complications reported from the medications currently being used. Staff do not report behavioral problems.   PAST MEDICAL HISTORY : Reviewed.  No changes.  CURRENT MEDICATIONS: Reviewed per [facility]  REVIEW OF SYSTEMS:  GENERAL: no change in appetite, no fatigue, no weight changes, no fever, chills or weakness RESPIRATORY: no cough, SOB, DOE, wheezing, hemoptysis CARDIAC: no chest pain, edema or palpitations GI: no abdominal pain, diarrhea, constipation, heart burn, nausea or vomiting  PHYSICAL EXAMINATION  GENERAL: no acute distress, normal body habitus SKIN: scalp laceration on right  scalp is dry, no redness noted; no staples nor sutures NECK: supple, trachea midline, no neck masses, no thyroid tenderness, no thyromegaly LYMPHATICS: no LAN in the neck, no supraclavicular LAN RESPIRATORY: breathing is even & unlabored, BS CTAB CARDIAC: RRR, no murmur,no extra heart sounds, no edema GI: abdomen soft, normal BS, no masses, no tenderness, no hepatomegaly, no splenomegaly EXTREMITIES: able to move all 4 extremities PSYCHIATRIC: the patient is alert & oriented to person, affect & behavior appropriate  LABS/RADIOLOGY: Labs reviewed: Basic Metabolic Panel:  Recent Labs  78/29/5601/02/26 1110 03/14/13 0340 03/15/13 0345  NA 145 149* 145  K 3.6* 3.8 3.9  CL 107 113* 108  CO2 26 25 25   GLUCOSE 132* 112* 120*  BUN 30* 33* 29*  CREATININE 0.92 0.93 0.97  CALCIUM 9.3 8.3* 8.3*   Liver Function Tests:  Recent Labs  07/01/12 0845  AST 14  ALT 13  ALKPHOS 66  BILITOT 0.7  PROT 6.8  ALBUMIN 3.3*    CBC:  Recent Labs  03/14/13 0340  03/15/13 0345 03/15/13 1610 03/16/13 1225  WBC 5.3  --  6.0  --  6.7  HGB 7.3*  < > 6.8* 8.4* 9.7*  HCT 22.0*  < > 20.3* 25.0* 29.6*  MCV 97.8  --  98.5  --  95.8  PLT 111*  --  101*  --  130*  < > = values in this interval not displayed.   Lipid Panel:  Recent Labs  07/01/12 0845  HDL 35.00*    ASSESSMENT/PLAN:  Scalp laceration  - healed; no bleeding Hypertension -  well controlled; continue with Norvasc and Caduet Hyperlipidemia - continue Lipitor CAD - Plavix restarted on 03/19/13 Atrial fibrillation - rate controlled; continue metoprolol GERD - continue Prilosec History of CVA - continue Plavix Depression - continue Lexapro Trigeminal Neuralgia - continue Nerontin Anemia - stable    I have filled out patient's discharge paperwork and written prescriptions.  Patient will receive home health PT, OT and Nursing.   Total discharge time: Less than 30 minutes Discharge time involved coordination of the discharge  process with Child psychotherapist, nursing staff and therapy department. Medical justification for home health services verified.   CPT CODE: 16109 Ella Bodo - NP [facility] [phone]

## 2013-03-31 ENCOUNTER — Encounter: Payer: Medicare Other | Admitting: Physician Assistant

## 2013-04-25 ENCOUNTER — Telehealth: Payer: Self-pay | Admitting: Cardiology

## 2013-04-25 NOTE — Telephone Encounter (Signed)
New message   Home health [facility] in the home now.     Patient had heart rate today of  50 . PCP was contacted Dr. Gildardo Crankerharles Ross advise home health to call Dr. Jens Somrenshaw.

## 2013-04-26 NOTE — Telephone Encounter (Signed)
Left message for Nathan Mills to call

## 2013-04-27 NOTE — Telephone Encounter (Signed)
New message     Pt has crackles in rt lower lobe and also is coughing.

## 2013-04-27 NOTE — Telephone Encounter (Signed)
Follow up  ° ° ° °Returning call back to nurse  °

## 2013-04-27 NOTE — Telephone Encounter (Signed)
Left message for dawn to call  

## 2013-04-27 NOTE — Telephone Encounter (Signed)
Left message for Nathan Mills to call  

## 2013-05-08 NOTE — Telephone Encounter (Signed)
No return call 

## 2013-07-10 ENCOUNTER — Other Ambulatory Visit: Payer: Self-pay | Admitting: Adult Health

## 2013-07-11 ENCOUNTER — Telehealth: Payer: Self-pay | Admitting: Internal Medicine

## 2013-07-11 NOTE — Telephone Encounter (Signed)
Unable to leave message on cell phone. Left message on home phone for pt to call back.

## 2013-07-11 NOTE — Telephone Encounter (Signed)
Pt scheduled to see Doug SouJessica Zehr PA Monday 07/17/13@2pm . Pt aware of appt. Pt is having problems swallowing.

## 2013-07-12 ENCOUNTER — Encounter: Payer: Self-pay | Admitting: *Deleted

## 2013-07-13 ENCOUNTER — Other Ambulatory Visit: Payer: Self-pay

## 2013-07-17 ENCOUNTER — Encounter: Payer: Self-pay | Admitting: Gastroenterology

## 2013-07-17 ENCOUNTER — Ambulatory Visit (INDEPENDENT_AMBULATORY_CARE_PROVIDER_SITE_OTHER): Payer: Medicare Other | Admitting: Gastroenterology

## 2013-07-17 VITALS — BP 138/80 | HR 72 | Ht 67.5 in | Wt 112.8 lb

## 2013-07-17 DIAGNOSIS — K224 Dyskinesia of esophagus: Secondary | ICD-10-CM

## 2013-07-17 DIAGNOSIS — K225 Diverticulum of esophagus, acquired: Secondary | ICD-10-CM

## 2013-07-17 DIAGNOSIS — I2510 Atherosclerotic heart disease of native coronary artery without angina pectoris: Secondary | ICD-10-CM

## 2013-07-17 DIAGNOSIS — R1319 Other dysphagia: Secondary | ICD-10-CM

## 2013-07-17 NOTE — Patient Instructions (Addendum)
You have been scheduled for a Barium Esophogram at [facility] (1st floor of the hospital) on 07-21-2013 at 11 am. Please arrive 15 minutes prior to your appointment for registration. Make certain not to have anything to eat or drink 6 hours prior to your test. If you need to reschedule for any reason, please contact radiology at [phone] to do so. __________________________________________________________________ A barium swallow is an examination that concentrates on views of the esophagus. This tends to be a double contrast exam (barium and two liquids which, when combined, create a gas to distend the wall of the oesophagus) or single contrast (non-ionic iodine based). The study is usually tailored to your symptoms so a good history is essential. Attention is paid during the study to the form, structure and configuration of the esophagus, looking for functional disorders (such as aspiration, dysphagia, achalasia, motility and reflux) EXAMINATION You may be asked to change into a gown, depending on the type of swallow being performed. A radiologist and radiographer will perform the procedure. The radiologist will advise you of the type of contrast selected for your procedure and direct you during the exam. You will be asked to stand, sit or lie in several different positions and to hold a small amount of fluid in your mouth before being asked to swallow while the imaging is performed .In some instances you may be asked to swallow barium coated marshmallows to assess the motility of a solid food bolus. The exam can be recorded as a digital or video fluoroscopy procedure. POST PROCEDURE It will take 1-2 days for the barium to pass through your system. To facilitate this, it is important, unless otherwise directed, to increase your fluids for the next 24-48hrs and to resume your normal diet.  This test typically takes about 30 minutes to  perform. __________________________________________________________________________________

## 2013-07-17 NOTE — Progress Notes (Signed)
07/17/2013 Nathan Mills [id number] Aug 20, 1917   History of Present Illness:  This is a pleasant 80 year old male who is a patient of Dr. Lamar SprinklesPerry's. He has multiple significant medical problems including coronary artery disease, hypertension, cerebrovascular disease with recurrent stroke, trigeminal neuralgia, dyslipidemia, and COPD.  He is on Plavix.  He is here today with his daughter to discuss his recurrent dysphagia. He had similar complaints in the past. In January of 2011 he underwent EGD, which revealed a stricture in the distal esophagus, which was dilated. He was also found to have pedunculated polyps in the fundus/body of the stomach as well as a hiatal hernia and a Zenker's diverticulum. His dilation at that time was performed while on aspirin and Plavix. He seemed to only have relief from the dilation for a very short period of time. He returned complaining of recurrent dysphasia in 07/2009, and he underwent an esophagram which revealed a Zenker's diverticulum in the upper esophagus with reflux of contrast material from the diverticulum into the oral cavity. He was also found to have mild esophageal dysmotility.  He came in again in 10/2012 with the same complaints. Described dysphagia to solid food and pills and points to his cervical esophagus. He had lost a lot of weight and was down to 118 pounds.  We repeat esophagram and swallowing evaluation.  Esophagram showed the following:  "IMPRESSION:  1. Reduced sensitivity and specificity of today's exam due to the patient's frailty/inability to turn, and also due to coughing and retching with individual swallows which limited our ability to image the patient when he would swallow. Speech pathology assessment to  check for incoordinated swallowing/tracheal aspiration may be warranted.  2. 18 mm in diameter diverticulum at the junction a cervical and thoracic esophagus. The 13 mm barium tablet impacted in this diverticulum and could not be  coaxed to move further. This tablet is designed to dissolve.  3. Nonspecific esophageal dysmotility disorder.  4. Distal esophagus could only be distended to about 16 mm. If this represents a true narrowing, it can be associated with symptoms of occasional transient food impaction."  Subsequently he was sent to [facility] where he had endoscopic repair of his Zenker's by Dr. Wyline MoodBranch.  The patient states that his symptoms resolved after his surgery but within the past 2-3 months the symptoms have recurred again.  Very similar symptoms as described previously with food and pills getting stuck (pointing to his cervical esophagus).  Weight is down to 112 pounds, which is 6 pounds less than when I saw him in 10/2012.   Current Medications, Allergies, Past Medical History, Past Surgical History, Family History and Social History were reviewed in [facility] electronic medical record.   Physical Exam: BP 138/80  Pulse 72  Ht 5' 7.5" (1.715 m)  Wt 112 lb 12.8 oz (51.166 kg)  BMI 17.40 kg/m2 General:  Thin and frail elderly white male in no acute distress Head: Normocephalic and atraumatic Eyes:  Sclerae anicteric, conjunctiva pink  Ears:  HOH Lungs: Clear throughout to auscultation Heart: Regular rate and rhythm Abdomen: Soft, non-distended.  Normal bowel sounds.  Non-tender.   Musculoskeletal: Symmetrical with no gross deformities  Extremities: No edema  Neurological: Alert oriented x 4, grossly non-focal Psychological:  Alert and cooperative. Normal mood and affect  Assessment and Recommendations: -Dysphagia to pills and solid food:  Patient has several reasons that could account for his dysphasia. He has history of Zenker's diverticulum (repaired endosc[facility] at [facility] in 12/2012) as  well as esophageal dysmotility. He did have an esophageal stricture on esophagram in 2011. We will evaluate with a repeat barium esophagram to determine if there is a recurrent Zenker's, etc.  Symptoms sound similar  to previous complaints prior to Zenker's repair.

## 2013-07-17 NOTE — Progress Notes (Signed)
Agree 

## 2013-07-20 ENCOUNTER — Ambulatory Visit (HOSPITAL_COMMUNITY): Payer: Medicare Other

## 2013-07-21 ENCOUNTER — Ambulatory Visit (HOSPITAL_COMMUNITY)
Admission: RE | Admit: 2013-07-21 | Discharge: 2013-07-21 | Disposition: A | Discharge status: Home / Self Care | Payer: Medicare Other | Source: Ambulatory Visit | Attending: Gastroenterology | Admitting: Gastroenterology

## 2013-07-21 DIAGNOSIS — K225 Diverticulum of esophagus, acquired: Secondary | ICD-10-CM | POA: Insufficient documentation

## 2013-07-21 DIAGNOSIS — R1319 Other dysphagia: Secondary | ICD-10-CM

## 2013-08-02 ENCOUNTER — Telehealth: Payer: Self-pay | Admitting: Internal Medicine

## 2013-08-03 ENCOUNTER — Encounter: Payer: Self-pay | Admitting: Cardiology

## 2013-08-03 ENCOUNTER — Ambulatory Visit (INDEPENDENT_AMBULATORY_CARE_PROVIDER_SITE_OTHER): Payer: Medicare Other | Admitting: Cardiology

## 2013-08-03 VITALS — BP 142/62 | HR 61 | Ht 68.0 in | Wt 115.7 lb

## 2013-08-03 DIAGNOSIS — I340 Nonrheumatic mitral (valve) insufficiency: Secondary | ICD-10-CM

## 2013-08-03 DIAGNOSIS — I10 Essential (primary) hypertension: Secondary | ICD-10-CM

## 2013-08-03 DIAGNOSIS — I4891 Unspecified atrial fibrillation: Secondary | ICD-10-CM

## 2013-08-03 DIAGNOSIS — I6529 Occlusion and stenosis of unspecified carotid artery: Secondary | ICD-10-CM

## 2013-08-03 DIAGNOSIS — I2510 Atherosclerotic heart disease of native coronary artery without angina pectoris: Secondary | ICD-10-CM

## 2013-08-03 DIAGNOSIS — I059 Rheumatic mitral valve disease, unspecified: Secondary | ICD-10-CM

## 2013-08-03 NOTE — Assessment & Plan Note (Signed)
Blood pressure controlled. Continue present medications. 

## 2013-08-03 NOTE — Assessment & Plan Note (Signed)
Plan no further echocardiograms as he would not be a surgical candidate. Patient in agreement. Continue conservative measures.

## 2013-08-03 NOTE — Assessment & Plan Note (Addendum)
-   Continue Plavix and statin 

## 2013-08-03 NOTE — Assessment & Plan Note (Signed)
Continue beta blocker for rate control. Continue Plavix. Given recurrent falls he is not a good Coumadin candidate.

## 2013-08-03 NOTE — Patient Instructions (Addendum)
Your physician wants you to follow-up in: 6 MONTHS WITH DR CRENSHAW You will receive a reminder letter in the mail two months in advance. If you don't receive a letter, please call our office to schedule the follow-up appointment.  

## 2013-08-03 NOTE — Assessment & Plan Note (Addendum)
Continue Plavix and statin. I do not think he would be a good surgical candidate given his age and overall medical condition. I therefore do not think we should pursue further carotid Dopplers. Discussed with patient and wife and they are in agreement.

## 2013-08-03 NOTE — Progress Notes (Signed)
HPI: FU atrial fibrillation, coronary disease and cerebrovascular disease. The patient has a history of remote myocardial infarction in 1982. Carotid Dopplers in December of 2014 showed 40-59% bilateral stenosis and followup recommended in one year. Echocardiogram in May of 2013 showed an ejection fraction of 55%, moderate to severe mitral regurgitation and moderate biatrial enlargement. There was a small pericardial effusion. The patient is felt not to be a Coumadin candidate because of recurrent falls. Since last seen, patient has some DOE but no chest pain, palpitations or syncope. He continues to fall occasionally.   Current Outpatient Prescriptions  Medication Sig Dispense Refill  . acetaminophen (TYLENOL) 650 MG CR tablet Take 1 every 6 hours by mouth as needed for mild pain or Fever ./= 101      . amLODipine-atorvastatin (CADUET) 5-20 MG per tablet Take 1 tablet by mouth daily.      . clopidogrel (PLAVIX) 75 MG tablet Take 1 tablet (75 mg total) by mouth daily.  90 tablet  3  . Cyanocobalamin (VITAMIN B12 PO) Take 1 tablet by mouth daily.       . ergocalciferol (VITAMIN D2) 50000 UNITS capsule Take 50,000 Units by mouth once a week.      . escitalopram (LEXAPRO) 10 MG tablet TAKE ONE TABLET BY MOUTH ONE TIME DAILY   30 tablet  0  . finasteride (PROSCAR) 5 MG tablet Take 5 mg by mouth daily.      Marland Kitchen gabapentin (NEURONTIN) 100 MG capsule Take 100 mg by mouth 3 (three) times daily.      . metoprolol succinate (TOPROL-XL) 25 MG 24 hr tablet Take 25 mg by mouth daily.      . tamsulosin (FLOMAX) 0.4 MG CAPS capsule Take 1 capsule by mouth daily.       No current facility-administered medications for this visit.     Past Medical History  Diagnosis Date  . CAD (coronary artery disease)     a. s/p remote MI 53  . Syncope   . TIA (transient ischemic attack)   . Zenker's diverticulum   . GERD (gastroesophageal reflux disease)   . Depression   . Nephrolithiasis   . Hyperlipidemia    . HTN (hypertension)   . Atrial fibrillation     a. Dx 04/2011  . Gallstones   . COPD (chronic obstructive pulmonary disease)   . Peptic stricture of esophagus   . CVA (cerebral infarction)   . Gastric polyp   . Bleeding   . Fall against object   . Acute blood loss anemia   . Syncope   . Hiatal hernia   . Trigeminal neuralgia   . Esophageal dysmotility   . Hiatal hernia     Past Surgical History  Procedure Laterality Date  . Laparoscopic cholecystectomy    . Implantation of a medtronic implantation loop recorder  03/01/2006    Dr. Ladona Ridgel  . Esophagogastroduodenoscopy  01/24/2009    Esophageal Stricture--Multiple EGD's    History   Social History  . Marital Status: Married    Spouse Name: N/A    Number of Children: 2  . Years of Education: N/A   Occupational History  . retired     Research officer, political party   Social History Main Topics  . Smoking status: Former Smoker    Quit date: 05/07/1961  . Smokeless tobacco: Never Used  . Alcohol Use: No  . Drug Use: No  . Sexual Activity: Not on file   Other Topics Concern  .  Not on file   Social History Narrative  . No narrative on file    ROS: no fevers or chills, productive cough, hemoptysis, dysphasia, odynophagia, melena, hematochezia, dysuria, hematuria, rash, seizure activity, orthopnea, PND, pedal edema, claudication. Remaining systems are negative.  Physical Exam: Well-developed frail in no acute distress.  Skin is warm and dry.  HEENT is normal.  Neck is supple.  Chest is clear to auscultation with normal expansion.  Cardiovascular exam is irregular Abdominal exam nontender or distended. No masses palpated. Extremities show no edema. neuro grossly intact  ECG Atrial fibrillation with PVCs or aberrantly conducted beats. Cannot rule out prior inferior infarct.

## 2013-08-08 ENCOUNTER — Other Ambulatory Visit: Payer: Self-pay | Admitting: Cardiology

## 2013-09-02 ENCOUNTER — Other Ambulatory Visit: Payer: Self-pay | Admitting: Adult Health

## 2013-09-25 ENCOUNTER — Telehealth: Payer: Self-pay | Admitting: Cardiology

## 2013-09-25 NOTE — Telephone Encounter (Signed)
Will fax this note to [facility]

## 2013-09-25 NOTE — Telephone Encounter (Signed)
Nathan Mills is calling to see if [name] can come off of his plavix for 7 days prior to his procedure (EGD) also she is faxing over the paperwork about coming off of the plavix. Please call if you have any questions .Marland Kitchen   Thanks

## 2013-09-25 NOTE — Telephone Encounter (Signed)
Ok to hold plavix 7 days prior to procedure and resume day after [name]  

## 2013-09-25 NOTE — Telephone Encounter (Signed)
Will forward for dr crenshaw review  

## 2013-10-10 NOTE — Telephone Encounter (Signed)
Noted  

## 2013-11-15 ENCOUNTER — Encounter: Payer: Self-pay | Admitting: Physician Assistant

## 2013-11-15 ENCOUNTER — Ambulatory Visit (INDEPENDENT_AMBULATORY_CARE_PROVIDER_SITE_OTHER): Payer: Medicare Other | Admitting: Physician Assistant

## 2013-11-15 ENCOUNTER — Telehealth: Payer: Self-pay | Admitting: Internal Medicine

## 2013-11-15 ENCOUNTER — Other Ambulatory Visit (INDEPENDENT_AMBULATORY_CARE_PROVIDER_SITE_OTHER): Payer: Medicare Other

## 2013-11-15 VITALS — BP 128/72 | HR 68 | Ht 68.0 in | Wt 116.5 lb

## 2013-11-15 DIAGNOSIS — E46 Unspecified protein-calorie malnutrition: Secondary | ICD-10-CM

## 2013-11-15 DIAGNOSIS — K225 Diverticulum of esophagus, acquired: Secondary | ICD-10-CM

## 2013-11-15 DIAGNOSIS — I2510 Atherosclerotic heart disease of native coronary artery without angina pectoris: Secondary | ICD-10-CM

## 2013-11-15 DIAGNOSIS — R634 Abnormal weight loss: Secondary | ICD-10-CM

## 2013-11-15 DIAGNOSIS — R197 Diarrhea, unspecified: Secondary | ICD-10-CM

## 2013-11-15 LAB — CBC WITH DIFFERENTIAL/PLATELET
Basophils Absolute: 0 10*3/uL (ref 0.0–0.1)
Basophils Relative: 0.3 % (ref 0.0–3.0)
Eosinophils Absolute: 0.2 10*3/uL (ref 0.0–0.7)
Eosinophils Relative: 3.4 % (ref 0.0–5.0)
HCT: 35.8 % — ABNORMAL LOW (ref 39.0–52.0)
Hemoglobin: 11.7 g/dL — ABNORMAL LOW (ref 13.0–17.0)
Lymphocytes Relative: 24.1 % (ref 12.0–46.0)
Lymphs Abs: 1.4 10*3/uL (ref 0.7–4.0)
MCHC: 32.8 g/dL (ref 30.0–36.0)
MCV: 98 fl (ref 78.0–100.0)
MONOS PCT: 6 % (ref 3.0–12.0)
Monocytes Absolute: 0.3 10*3/uL (ref 0.1–1.0)
Neutro Abs: 3.8 10*3/uL (ref 1.4–7.7)
Neutrophils Relative %: 66.2 % (ref 43.0–77.0)
PLATELETS: 161 10*3/uL (ref 150.0–400.0)
RBC: 3.65 Mil/uL — ABNORMAL LOW (ref 4.22–5.81)
RDW: 14 % (ref 11.5–15.5)
WBC: 5.7 10*3/uL (ref 4.0–10.5)

## 2013-11-15 LAB — COMPREHENSIVE METABOLIC PANEL
ALBUMIN: 3.3 g/dL — AB (ref 3.5–5.2)
ALT: 8 U/L (ref 0–53)
AST: 15 U/L (ref 0–37)
Alkaline Phosphatase: 72 U/L (ref 39–117)
BUN: 25 mg/dL — AB (ref 6–23)
CO2: 22 mEq/L (ref 19–32)
Calcium: 9.2 mg/dL (ref 8.4–10.5)
Chloride: 109 mEq/L (ref 96–112)
Creatinine, Ser: 1.1 mg/dL (ref 0.4–1.5)
GFR: 69.85 mL/min (ref >60.00)
Glucose, Bld: 132 mg/dL — ABNORMAL HIGH (ref 70–99)
POTASSIUM: 4 meq/L (ref 3.5–5.1)
SODIUM: 141 meq/L (ref 135–145)
Total Bilirubin: 0.7 mg/dL (ref 0.2–1.2)
Total Protein: 7.1 g/dL (ref 6.0–8.3)

## 2013-11-15 LAB — IGA: IGA: 471 mg/dL — AB (ref 68–378)

## 2013-11-15 MED ORDER — BENEFIBER PO POWD: ORAL | Status: AC

## 2013-11-15 MED ORDER — LOPERAMIDE HCL 2 MG PO TABS: 2.0000 mg | ORAL_TABLET | ORAL | Status: AC | PRN

## 2013-11-15 NOTE — Progress Notes (Signed)
Patient ID: Nathan ParadiseWilliam E Mills, male   DOB: 1917-08-11, 80 y.o.   MRN: [id number]     History of Present Illness: Nathan Mills is a 80 year old male who has been followed by Dr. Marina GoodellPerry for hiatal hernia, Zenker's diverticulum, and dysphagia. He is here today accompanied by his daughter due to complaints of 6 months of "diarrhea". The daughter states that the patient has been having increasing trouble with his dysphasia. He was recently seen by his [facility] and had a barium swallow that showed he is aspirating. She states that they were told he needs to eat. Carefully and either deal with his coughing and sputtering and the risk of pneumonia or get a feeding tube. The patient does not want a feeding tube. He has been able to eat only very small amounts of food. The daughter states he will have a small bowl of special K for breakfast he can only eat approximately one quarter breakfast sandwich at lunch and will have a small amount of beef tips and mashed potatoes at supper. She states he has been losing weight though on our records his weight appears to be stable. The patient and his daughter say that he is having a large mushy or watery bowel movement daily. He is unable to hold his stool and has difficulty cleaning up and has to shower every day after his bowel movement. He has no bright red blood per rectum and no melena. He has not had any recent antibiotics. His stools are not oily.  Past Medical History  Diagnosis Date  . CAD (coronary artery disease)     a. s/p remote MI 411982  . Syncope   . TIA (transient ischemic attack)   . Zenker's diverticulum   . GERD (gastroesophageal reflux disease)   . Depression   . Nephrolithiasis   . Hyperlipidemia   . HTN (hypertension)   . Atrial fibrillation     a. Dx 04/2011  . Gallstones   . COPD (chronic obstructive pulmonary disease)   . Peptic stricture of esophagus   . CVA (cerebral infarction)   . Gastric polyp   . Bleeding   . Fall against  object   . Acute blood loss anemia   . Syncope   . Hiatal hernia   . Trigeminal neuralgia   . Esophageal dysmotility   . Hiatal hernia     Past Surgical History  Procedure Laterality Date  . Laparoscopic cholecystectomy    . Implantation of a medtronic implantation loop recorder  03/01/2006    Dr. Ladona Ridgelaylor  . Esophagogastroduodenoscopy  01/24/2009    Esophageal Stricture--Multiple EGD's   Family History  Problem Relation Age of Onset  . Coronary artery disease    . Cancer Daughter     eye cancer   History  Substance Use Topics  . Smoking status: Former Smoker    Quit date: 05/07/1961  . Smokeless tobacco: Never Used  . Alcohol Use: No   Current Outpatient Prescriptions  Medication Sig Dispense Refill  . acetaminophen (TYLENOL) 650 MG CR tablet Take 1 every 6 hours by mouth as needed for mild pain or Fever ./= 101    . amLODipine-atorvastatin (CADUET) 5-20 MG per tablet TAKE 1 TABLET DAILY 90 tablet 1  . clopidogrel (PLAVIX) 75 MG tablet Take 1 tablet (75 mg total) by mouth daily. 90 tablet 3  . Cyanocobalamin (VITAMIN B12 PO) Take 1 tablet by mouth daily.     . ergocalciferol (VITAMIN D2) 50000 UNITS capsule  Take 50,000 Units by mouth once a week.    . escitalopram (LEXAPRO) 10 MG tablet TAKE ONE TABLET BY MOUTH ONE TIME DAILY  30 tablet 0  . finasteride (PROSCAR) 5 MG tablet Take 5 mg by mouth daily.    Marland Kitchen. gabapentin (NEURONTIN) 100 MG capsule Take 100 mg by mouth 3 (three) times daily.    Marland Kitchen. loperamide (IMODIUM A-D) 2 MG tablet Take 1 tablet (2 mg total) by mouth as needed for diarrhea or loose stools. 30 tablet 0  . metoprolol succinate (TOPROL-XL) 25 MG 24 hr tablet Take 25 mg by mouth daily.    . tamsulosin (FLOMAX) 0.4 MG CAPS capsule Take 1 capsule by mouth daily.    . Wheat Dextrin (BENEFIBER) POWD Take 1 tablespoon by mouth in water or juice once daily  0   No current facility-administered medications for this visit.   No Known Allergies    Review of  Systems: Gen: Denies any fever, chills, sweats, anorexia, fatigue, weakness, malaise, weight loss, and sleep disorder CV: Denies chest pain, angina, palpitations, syncope, orthopnea, PND, peripheral edema, and claudication. Resp: Denies dyspnea at rest, dyspnea with exercise, cough, sputum, wheezing, coughing up blood, and pleurisy. GI: Has transfer dysphagia and a Zenkers diverticulum. GU : Denies urinary burning, blood in urine, urinary frequency, urinary hesitancy, nocturnal urination, and urinary incontinence. MS: Denies joint pain, limitation of movement, and swelling, stiffness, low back pain, extremity pain. Denies muscle weakness, cramps, atrophy.  Derm: Denies rash, itching, dry skin, hives, moles, warts, or unhealing ulcers.  Psych: Denies depression, anxiety, memory loss, suicidal ideation, hallucinations, paranoia, and confusion. Heme: Denies bruising, bleeding, and enlarged lymph nodes. Neuro:  Denies any headaches, dizziness, paresthesia Endo:  Denies any problems with DM, thyroid, adrenal   Physical Exam: General: Pleasant, pale, frail, wheelchair bound, elderly male in no acute distress Head: Normocephalic and atraumatic, temporal wasting noted Eyes:  sclerae anicteric, conjunctiva pink  Ears: Normal auditory acuity Lungs: Clear throughout to auscultation Heart:irreg Abdomen: Soft, non distended, non-tender. No masses, no hepatomegaly. Normal bowel sounds Rectal: deferred per pt Musculoskeletal: Symmetrical with no gross deformities  Extremities: No edema  Neurological: Alert oriented x 4, grossly nonfocal Psychological:  Alert and cooperative. Normal mood and affect  Assessment and Recommendations: 80 year old male with a known history of transfer dysphagia and Zenker's diverticulum who is having difficulty eating and now experiencing diarrhea here for evaluation. We have spent time discussing caloric supplementation with the patient and his daughter and they will try  using boost or  Ensure, although the daughter says the patient doesn't like to drink fluids. The patient continues to state he does not want a feeding tube. With regards to his diarrhea is GI stool pathogen panel will be obtained. A CBC, comprehensive metabolic panel, TTG, IgA, and pre-albumin will be obtained. He has been instructed to try Benefiber a tablespoon daily and to uses Imodium as needed. Dietary consult will be obtained to maximize caloric intake. Further recommendations will be made pending the results of his tests. Pt will follow up in 4-6 weeks, sooner as needed.        Kimba Lottes, Moise BoringLori P PA-C 11/15/2013

## 2013-11-15 NOTE — Telephone Encounter (Signed)
Left message for pt to call back.  Pts daughter states he has been having problems with chronic diarrhea. States it is dragging him down and he has lost weight. Pt scheduled to see Lawson FiscalLori Hvozdovic, PA-C today at 2:45pm. Pts daughter aware of appt.

## 2013-11-15 NOTE — Patient Instructions (Addendum)
Your physician has requested that you go to the basement for  lab work before leaving today.  We put in a referral for Dietary Consult and they will contact you to set up an appointment.  Please purchase Benefiber over the counter and take 1 tablespoon in water or juice once daily.  Please purchase Imodium over the counter and take as needed.  You will be due for a recall office visit in 12/2013. We will send you a reminder in the mail when it gets closer to that time.

## 2013-11-16 ENCOUNTER — Other Ambulatory Visit: Payer: Medicare Other

## 2013-11-16 DIAGNOSIS — E46 Unspecified protein-calorie malnutrition: Secondary | ICD-10-CM

## 2013-11-16 DIAGNOSIS — R197 Diarrhea, unspecified: Secondary | ICD-10-CM

## 2013-11-16 LAB — TISSUE TRANSGLUTAMINASE, IGA: Tissue Transglutaminase Ab, IgA: 11.7 U/mL (ref <20)

## 2013-11-16 LAB — PREALBUMIN: Prealbumin: 16.9 mg/dL — ABNORMAL LOW (ref 17.0–34.0)

## 2013-11-16 NOTE — Progress Notes (Signed)
Patient well known to me. Agree with initial assessment and plans 

## 2013-11-17 LAB — GASTROINTESTINAL PATHOGEN PANEL PCR
C. DIFFICILE TOX A/B, PCR: NEGATIVE
Campylobacter, PCR: NEGATIVE
Cryptosporidium, PCR: NEGATIVE
E coli (ETEC) LT/ST PCR: NEGATIVE
E coli (STEC) stx1/stx2, PCR: NEGATIVE
E coli 0157, PCR: NEGATIVE
GIARDIA LAMBLIA, PCR: NEGATIVE
Norovirus, PCR: NEGATIVE
Rotavirus A, PCR: NEGATIVE
SALMONELLA, PCR: NEGATIVE
SHIGELLA, PCR: NEGATIVE

## 2013-11-29 ENCOUNTER — Encounter: Payer: Medicare Other | Attending: Physician Assistant | Admitting: Dietician

## 2013-11-29 ENCOUNTER — Other Ambulatory Visit: Payer: Self-pay | Admitting: Internal Medicine

## 2013-11-29 VITALS — Wt 113.3 lb

## 2013-11-29 DIAGNOSIS — R634 Abnormal weight loss: Secondary | ICD-10-CM | POA: Insufficient documentation

## 2013-11-29 DIAGNOSIS — R197 Diarrhea, unspecified: Secondary | ICD-10-CM | POA: Insufficient documentation

## 2013-11-29 DIAGNOSIS — Z713 Dietary counseling and surveillance: Secondary | ICD-10-CM | POA: Insufficient documentation

## 2013-11-29 NOTE — Patient Instructions (Addendum)
-  Try having a Magic Cup or ice cream after every meal -Encourage energy-dense, high protein foods: nuts, nut butters, full-fat ice cream, Magic Cup, full fat cheese, eggs, cream-based soups, whole milk -Add protein powder or cheese to foods  -Have a protein food with every meal -Try Gatorade - work on rehydrating after bouts of diarrhea

## 2013-11-29 NOTE — Progress Notes (Signed)
  Medical Nutrition Therapy:  Appt start time: 0945 end time:  1030.   Assessment:  Primary concerns today: [name] is here today with his daughter, Nathan Mills. She states that her father has "had a lot of gastro issues." Has a diverticulum in throat and has difficulty swallowing as food gets stuck in his throat. He had surgery to correct this 1 year ago but still has trouble eating. [facility] and daughter reports there is nothing further that can be done. She reports that he also has some trouble aspirating. Has frequent bouts of diarrhea; seemingly random with unknown cause. Diarrhea occurred yesterday after having baked chicken and mashed potatoes. Tried eliminating milk products from the diet and this did not help. He has been using Benefiber in morning coffee and this seems to help some. [name] does not like Ensure but eats it when his daughter mixes it with ice cream. Eats 3x a day but does not like to snack. [name] lives at home with his wife and they have a caregiver during the day. Daughter recognizes that he doesn't drink enough and possibly dehydrated.   Preferred Learning Style:   No preference indicated   Learning Readiness:   Ready  MEDICATIONS: see list   DIETARY INTAKE:  Usual eating pattern includes 3 meals and 0 snacks per day.  24-hr recall:  B ( AM): bacon/sausage and waffles/pancakes OR cereal and muffin OR eggs with coffee and orange juice Snk ( AM):   L ( PM): meat, vegetables, always with a piece of bread with dessert with sweet tea Snk ( PM):  D ( PM): roast beef, 1/2 sandwich, soup, cheese crackers, dessert  Snk ( PM):   Beverages: sweet tea, coffee, orange juice  Estimated energy needs: 1600-1800 calories 180-200 g carbohydrates 120-135 g protein 44-50 g fat  Progress Towards Goal(s):  In progress.   Nutritional Diagnosis:  Nathan Mills-3.2 Unintentional weight loss As related to frequent diarrhea and advancing age.  As evidenced by  significant weight loss per patient's daughter.    Intervention:  Nutrition counseling provided to patient's daughter. Goals: -Try having a Magic Cup or ice cream after every meal -Encourage energy-dense, high protein foods: nuts, nut butters, full-fat ice cream, Magic Cup, full fat cheese, eggs, cream-based soups, whole milk -Add protein powder or cheese to foods  -Have a protein food with every meal -Try Gatorade - work on rehydrating after bouts of diarrhea  Samples provided and patient instructed on proper use: Unjury protein powder (vanilla - qty 2) Lot#: [id number] Exp: 10/2014  Unjury protein powder (chocolate - qty 2) Lot#: [id number] Exp: 12/2014  Unjury protein powder (strawberry - qty 2) Lot#: [id number] Exp: 08/2014  Unjury protein powder (unflavored - qty 2) Lot#: [id number] Exp: 10/2014  Unjury protein powder (chicken soup - qty 2) Lot#: [id number] Exp: 03/2014  Teaching Method Utilized:  Visual Auditory  Handouts given during visit include:  High Fiber food list  Barriers to learning/adherence to lifestyle change: dementia  Demonstrated degree of understanding via:  Teach Back   Monitoring/Evaluation:  Dietary intake and body weight in 4 week(s).

## 2013-12-04 ENCOUNTER — Other Ambulatory Visit: Payer: Self-pay | Admitting: Internal Medicine

## 2013-12-04 DIAGNOSIS — F419 Anxiety disorder, unspecified: Secondary | ICD-10-CM

## 2013-12-27 ENCOUNTER — Ambulatory Visit: Payer: Medicare Other | Admitting: Dietician

## 2014-01-01 ENCOUNTER — Ambulatory Visit: Payer: Medicare Other | Admitting: Dietician

## 2014-01-17 ENCOUNTER — Other Ambulatory Visit: Payer: Self-pay | Admitting: Adult Health

## 2014-01-27 ENCOUNTER — Encounter: Payer: Self-pay | Admitting: Internal Medicine

## 2014-02-17 NOTE — Progress Notes (Signed)
HPI: FU atrial fibrillation, coronary disease and cerebrovascular disease. The patient has a history of remote myocardial infarction in 1982. Carotid Dopplers in December of 2014 showed 40-59% bilateral stenosis and followup recommended in one year. Echocardiogram in May of 2013 showed an ejection fraction of 55%, moderate to severe mitral regurgitation and moderate biatrial enlargement. There was a small pericardial effusion. The patient is felt not to be a Coumadin candidate because of recurrent falls. Decision made previously not to perform fu carotid dopplers or echos given pts age. Since last seen, patient has some DOE but no chest pain, palpitations or syncope. He continues to fall occasionally.  Current Outpatient Prescriptions  Medication Sig Dispense Refill  . acetaminophen (TYLENOL) 650 MG CR tablet Take 1 every 6 hours by mouth as needed for mild pain or Fever ./= 101    . amLODipine-atorvastatin (CADUET) 5-20 MG per tablet TAKE 1 TABLET DAILY 90 tablet 1  . Cholecalciferol 1000 UNITS tablet Take 1 tablet by mouth daily.    . clopidogrel (PLAVIX) 75 MG tablet Take 1 tablet (75 mg total) by mouth daily. 90 tablet 3  . Cyanocobalamin (VITAMIN B12 PO) Take 1 tablet by mouth daily.     . ergocalciferol (VITAMIN D2) 50000 UNITS capsule Take 50,000 Units by mouth once a week.    . escitalopram (LEXAPRO) 10 MG tablet Take 1 tablet daily by mouth for anxiety. 30 tablet 1  . finasteride (PROSCAR) 5 MG tablet Take 5 mg by mouth daily.    Marland Kitchen gabapentin (NEURONTIN) 100 MG capsule Take 100 mg by mouth as needed.     Marland Kitchen GARLIC PO Take 1 tablet by mouth daily.    Marland Kitchen loperamide (IMODIUM A-D) 2 MG tablet Take 1 tablet (2 mg total) by mouth as needed for diarrhea or loose stools. 30 tablet 0  . metoprolol succinate (TOPROL-XL) 25 MG 24 hr tablet Take 25 mg by mouth daily.    Marland Kitchen omeprazole (PRILOSEC) 10 MG capsule Take 1 capsule by mouth daily.    . tamsulosin (FLOMAX) 0.4 MG CAPS capsule Take 1  capsule by mouth daily.    . Wheat Dextrin (BENEFIBER) POWD Take 1 tablespoon by mouth in water or juice once daily  0   No current facility-administered medications for this visit.     Past Medical History  Diagnosis Date  . CAD (coronary artery disease)     a. s/p remote MI 88  . Syncope   . TIA (transient ischemic attack)   . Zenker's diverticulum   . GERD (gastroesophageal reflux disease)   . Depression   . Nephrolithiasis   . Hyperlipidemia   . HTN (hypertension)   . Atrial fibrillation     a. Dx 04/2011  . Gallstones   . COPD (chronic obstructive pulmonary disease)   . Peptic stricture of esophagus   . CVA (cerebral infarction)   . Gastric polyp   . Bleeding   . Fall against object   . Acute blood loss anemia   . Syncope   . Hiatal hernia   . Trigeminal neuralgia   . Esophageal dysmotility   . Hiatal hernia     Past Surgical History  Procedure Laterality Date  . Laparoscopic cholecystectomy    . Implantation of a medtronic implantation loop recorder  03/01/2006    Dr. Ladona Ridgel  . Esophagogastroduodenoscopy  01/24/2009    Esophageal Stricture--Multiple EGD's    History   Social History  . Marital Status: Married  Spouse Name: N/A    Number of Children: 2  . Years of Education: N/A   Occupational History  . 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 Smoking status: Former Smoker    Quit date: 05/07/1961  . Smokeless tobacco: Never Used  . Alcohol Use: No  . Drug Use: No  . Sexual Activity: Not on file   Other Topics Concern  . Not on file   Social History Narrative    ROS: no fevers or chills, productive cough, hemoptysis, dysphasia, odynophagia, melena, hematochezia, dysuria, hematuria, rash, seizure activity, orthopnea, PND, pedal edema, claudication. Remaining systems are negative.  Physical Exam: Well-developed frail in no acute distress.  Skin is warm and dry.  HEENT is normal.  Neck is supple.  Chest is clear to  auscultation with normal expansion.  Cardiovascular exam is irregular Abdominal exam nontender or distended. No masses palpated. Extremities show no edema. neuro grossly intact  ECG atrial fibrillation at a rate of 89. Left axis deviation. Nonspecific ST changes.

## 2014-02-20 ENCOUNTER — Encounter: Payer: Self-pay | Admitting: Cardiology

## 2014-02-20 ENCOUNTER — Ambulatory Visit (INDEPENDENT_AMBULATORY_CARE_PROVIDER_SITE_OTHER): Payer: Medicare Other | Admitting: Cardiology

## 2014-02-20 VITALS — BP 114/62 | HR 89 | Ht 68.0 in | Wt 119.6 lb

## 2014-02-20 DIAGNOSIS — I10 Essential (primary) hypertension: Secondary | ICD-10-CM

## 2014-02-20 DIAGNOSIS — I482 Chronic atrial fibrillation, unspecified: Secondary | ICD-10-CM

## 2014-02-20 DIAGNOSIS — I2510 Atherosclerotic heart disease of native coronary artery without angina pectoris: Secondary | ICD-10-CM

## 2014-02-20 DIAGNOSIS — I340 Nonrheumatic mitral (valve) insufficiency: Secondary | ICD-10-CM

## 2014-02-20 NOTE — Assessment & Plan Note (Signed)
-   Continue Plavix and statin 

## 2014-02-20 NOTE — Patient Instructions (Signed)
Your physician wants you to follow-up in: 6 MONTHS WITH DR CRENSHAW You will receive a reminder letter in the mail two months in advance. If you don't receive a letter, please call our office to schedule the follow-up appointment.   Your physician recommends that you return for lab work WHEN FASTING   

## 2014-02-20 NOTE — Assessment & Plan Note (Signed)
Patient remains in permanent atrial fibrillation. Continue Plavix. Not a Coumadin candidate because of recurrent falls. Continue Toprol.

## 2014-02-20 NOTE — Assessment & Plan Note (Signed)
We will not pursue further echocardiograms as he would not be a candidate for surgical intervention.

## 2014-02-20 NOTE — Assessment & Plan Note (Signed)
Continue statin. Lipids and liver.

## 2014-02-20 NOTE — Assessment & Plan Note (Signed)
Continue Plavix and statin. We have made the decision previously to not pursue follow-up carotid Dopplers given patient's age. He would not be a good candidate for surgical intervention.

## 2014-02-20 NOTE — Assessment & Plan Note (Signed)
Blood pressure controlled. Continue present medications. Check potassium and renal function. 

## 2014-02-21 ENCOUNTER — Other Ambulatory Visit: Payer: Self-pay | Admitting: Adult Health

## 2014-03-02 ENCOUNTER — Other Ambulatory Visit: Payer: Self-pay | Admitting: Adult Health

## 2014-03-16 ENCOUNTER — Encounter: Payer: Self-pay | Admitting: *Deleted

## 2014-03-16 LAB — LIPID PANEL
CHOLESTEROL: 140 mg/dL (ref 0–200)
HDL: 44 mg/dL (ref >=40)
LDL Cholesterol: 80 mg/dL (ref 0–99)
TRIGLYCERIDES: 81 mg/dL (ref <150)
Total CHOL/HDL Ratio: 3.2 Ratio
VLDL: 16 mg/dL (ref 0–40)

## 2014-03-16 LAB — BASIC METABOLIC PANEL WITH GFR
BUN: 27 mg/dL — AB (ref 6–23)
CALCIUM: 9.3 mg/dL (ref 8.4–10.5)
CO2: 29 mEq/L (ref 19–32)
Chloride: 106 mEq/L (ref 96–112)
Creat: 1.03 mg/dL (ref 0.50–1.35)
GFR, Est African American: 72 mL/min
GFR, Est Non African American: 62 mL/min
GLUCOSE: 92 mg/dL (ref 70–99)
Potassium: 4.3 mEq/L (ref 3.5–5.3)
Sodium: 143 mEq/L (ref 135–145)

## 2014-04-24 ENCOUNTER — Ambulatory Visit (INDEPENDENT_AMBULATORY_CARE_PROVIDER_SITE_OTHER): Payer: Medicare Other | Admitting: Internal Medicine

## 2014-04-24 ENCOUNTER — Encounter: Payer: Self-pay | Admitting: Internal Medicine

## 2014-04-24 VITALS — BP 126/58 | HR 76 | Ht 68.0 in | Wt 120.0 lb

## 2014-04-24 DIAGNOSIS — R32 Unspecified urinary incontinence: Secondary | ICD-10-CM

## 2014-04-24 DIAGNOSIS — I2510 Atherosclerotic heart disease of native coronary artery without angina pectoris: Secondary | ICD-10-CM | POA: Diagnosis not present

## 2014-04-24 NOTE — Progress Notes (Signed)
HISTORY OF PRESENT ILLNESS:  Nathan Mills is a 81 y.o. male with multiple significant medical problems as listed below. He is accompanied by his daughter Nathan Mills regarding "diarrhea" with incontinence. He has been seen here previously for GERD with peptic stricture, Zenker's diverticulum, esophageal dysmotility, oropharyngeal dysphagia with aspiration (declines PEG), and incontinence. Last evaluation November 2015. Stool studies negative. Continues with loose incontinent stools without warning. Sometimes when standing to urinate. Has been able to gain a few pounds since last visit. No abdominal pain or bleeding. Has used Imodium with some improvement. Has had CVA and multiple mini strokes. No rectal at the time of his last visit. He does wear protective undergarments  REVIEW OF SYSTEMS:  All non-GI ROS negative except for hearing problems  Past Medical History  Diagnosis Date  . CAD (coronary artery disease)     a. s/p remote MI 591982  . Syncope   . TIA (transient ischemic attack)   . Zenker's diverticulum   . GERD (gastroesophageal reflux disease)   . Depression   . Nephrolithiasis   . Hyperlipidemia   . HTN (hypertension)   . Atrial fibrillation     a. Dx 04/2011  . Gallstones   . COPD (chronic obstructive pulmonary disease)   . Peptic stricture of esophagus   . CVA (cerebral infarction)   . Gastric polyp   . Bleeding   . Fall against object   . Acute blood loss anemia   . Syncope   . Hiatal hernia   . Trigeminal neuralgia   . Esophageal dysmotility   . Hiatal hernia     Past Surgical History  Procedure Laterality Date  . Laparoscopic cholecystectomy    . Implantation of a medtronic implantation loop recorder  03/01/2006    Dr. Ladona Ridgelaylor  . Esophagogastroduodenoscopy  01/24/2009    Esophageal Stricture--Multiple EGD's    Social History Nathan Mills  reports that he quit smoking about 53 years ago. He has never used smokeless tobacco. He reports that he does not  drink alcohol or use illicit drugs.  family history includes Cancer in his daughter; Coronary artery disease in an other family member.  No Known Allergies     PHYSICAL EXAMINATION: Vital signs: BP 126/58 mmHg  Pulse 76  Ht 5\' 8"  (1.727 m)  Wt 120 lb (54.432 kg)  BMI 18.25 kg/m2 General: Well-developed, but somewhat frail elderly male well-nourished, no acute distress HEENT: Sclerae are anicteric, conjunctiva pink. Oral mucosa intact Lungs: Clear Heart: Regular Abdomen: soft, nontender, nondistended, no obvious ascites, no peritoneal signs, normal bowel sounds. No organomegaly. Rectal: Erythema of the perirectal skin which remains intact. No tenderness or mass on internal inspection. Ileum Hemoccult negative stool without impaction. Absent rectal tone Extremities: No edema Psychiatric: alert and oriented x3. Cooperative     ASSESSMENT:  #1. Fecal incontinence secondary to diminished rectal tone and advanced age   PLAN:  #1. Imodium #2. Protective undergarments #3. Scheduled toilet regimen 4 times a day. Also advised him to sit when urinating  20 minutes spent face-to-face with the patient. Greater than 50% of the time spent counseling the patient and his daughter regarding his incontinence and the treatment plan

## 2014-04-24 NOTE — Patient Instructions (Signed)
Please follow up as needed 

## 2014-05-20 ENCOUNTER — Emergency Department (HOSPITAL_BASED_OUTPATIENT_CLINIC_OR_DEPARTMENT_OTHER): Payer: Medicare Other

## 2014-05-20 ENCOUNTER — Emergency Department (HOSPITAL_BASED_OUTPATIENT_CLINIC_OR_DEPARTMENT_OTHER)
Admission: EM | Admit: 2014-05-20 | Discharge: 2014-05-20 | Disposition: A | Discharge status: Home / Self Care | Payer: Medicare Other | Attending: Emergency Medicine | Admitting: Emergency Medicine

## 2014-05-20 ENCOUNTER — Encounter (HOSPITAL_BASED_OUTPATIENT_CLINIC_OR_DEPARTMENT_OTHER): Payer: Self-pay | Admitting: *Deleted

## 2014-05-20 DIAGNOSIS — Z87442 Personal history of urinary calculi: Secondary | ICD-10-CM | POA: Insufficient documentation

## 2014-05-20 DIAGNOSIS — I4891 Unspecified atrial fibrillation: Secondary | ICD-10-CM | POA: Diagnosis not present

## 2014-05-20 DIAGNOSIS — Z79899 Other long term (current) drug therapy: Secondary | ICD-10-CM | POA: Diagnosis not present

## 2014-05-20 DIAGNOSIS — K219 Gastro-esophageal reflux disease without esophagitis: Secondary | ICD-10-CM | POA: Insufficient documentation

## 2014-05-20 DIAGNOSIS — Z8673 Personal history of transient ischemic attack (TIA), and cerebral infarction without residual deficits: Secondary | ICD-10-CM | POA: Diagnosis not present

## 2014-05-20 DIAGNOSIS — Z87891 Personal history of nicotine dependence: Secondary | ICD-10-CM | POA: Diagnosis not present

## 2014-05-20 DIAGNOSIS — I2510 Atherosclerotic heart disease of native coronary artery without angina pectoris: Secondary | ICD-10-CM | POA: Insufficient documentation

## 2014-05-20 DIAGNOSIS — Y9389 Activity, other specified: Secondary | ICD-10-CM | POA: Insufficient documentation

## 2014-05-20 DIAGNOSIS — I10 Essential (primary) hypertension: Secondary | ICD-10-CM | POA: Insufficient documentation

## 2014-05-20 DIAGNOSIS — W01198A Fall on same level from slipping, tripping and stumbling with subsequent striking against other object, initial encounter: Secondary | ICD-10-CM | POA: Insufficient documentation

## 2014-05-20 DIAGNOSIS — W19XXXA Unspecified fall, initial encounter: Secondary | ICD-10-CM

## 2014-05-20 DIAGNOSIS — S0101XA Laceration without foreign body of scalp, initial encounter: Secondary | ICD-10-CM | POA: Diagnosis not present

## 2014-05-20 DIAGNOSIS — Y92008 Other place in unspecified non-institutional (private) residence as the place of occurrence of the external cause: Secondary | ICD-10-CM | POA: Insufficient documentation

## 2014-05-20 DIAGNOSIS — S0990XA Unspecified injury of head, initial encounter: Secondary | ICD-10-CM | POA: Diagnosis present

## 2014-05-20 DIAGNOSIS — Y998 Other external cause status: Secondary | ICD-10-CM | POA: Insufficient documentation

## 2014-05-20 DIAGNOSIS — D649 Anemia, unspecified: Secondary | ICD-10-CM | POA: Diagnosis not present

## 2014-05-20 DIAGNOSIS — H9190 Unspecified hearing loss, unspecified ear: Secondary | ICD-10-CM | POA: Insufficient documentation

## 2014-05-20 DIAGNOSIS — Z8639 Personal history of other endocrine, nutritional and metabolic disease: Secondary | ICD-10-CM | POA: Diagnosis not present

## 2014-05-20 DIAGNOSIS — Z7902 Long term (current) use of antithrombotics/antiplatelets: Secondary | ICD-10-CM | POA: Diagnosis not present

## 2014-05-20 DIAGNOSIS — Z86010 Personal history of colonic polyps: Secondary | ICD-10-CM | POA: Insufficient documentation

## 2014-05-20 DIAGNOSIS — J449 Chronic obstructive pulmonary disease, unspecified: Secondary | ICD-10-CM | POA: Insufficient documentation

## 2014-05-20 DIAGNOSIS — F329 Major depressive disorder, single episode, unspecified: Secondary | ICD-10-CM | POA: Diagnosis not present

## 2014-05-20 NOTE — ED Notes (Signed)
Pt reports 2 nights ago with fall and syncope following fall after hitting his head.  Does not recall much about incident, reports intermittent ha but not current.  Hx of mild dementia but able to converse without difficulty and currently a/o x 4.  Family reports mild confusion but not acutely worse from baseline.  Denies dizziness, lightheadedness.  Currently on plavix d/t pacemaker in place.  Ambulatory with shuffling gait, standby assist.  Denies cspine tenderness.  Placed on cardiac, bp and pulse ox monitoring.

## 2014-05-20 NOTE — Discharge Instructions (Signed)
Fall Prevention and Home Safety Falls cause injuries and can affect all age groups. It is possible to use preventive measures to significantly decrease the likelihood of falls. There are many simple measures which can make your home safer and prevent falls. OUTDOORS  Repair cracks and edges of walkways and driveways.  Remove high doorway thresholds.  Trim shrubbery on the main path into your home.  Have good outside lighting.  Clear walkways of tools, rocks, debris, and clutter.  Check that handrails are not broken and are securely fastened. Both sides of steps should have handrails.  Have leaves, snow, and ice cleared regularly.  Use sand or salt on walkways during winter months.  In the garage, clean up grease or oil spills. BATHROOM  Install night lights.  Install grab bars by the toilet and in the tub and shower.  Use non-skid mats or decals in the tub or shower.  Place a plastic non-slip stool in the shower to sit on, if needed.  Keep floors dry and clean up all water on the floor immediately.  Remove soap buildup in the tub or shower on a regular basis.  Secure bath mats with non-slip, double-sided rug tape.  Remove throw rugs and tripping hazards from the floors. BEDROOMS  Install night lights.  Make sure a bedside light is easy to reach.  Do not use oversized bedding.  Keep a telephone by your bedside.  Have a firm chair with side arms to use for getting dressed.  Remove throw rugs and tripping hazards from the floor. KITCHEN  Keep handles on pots and pans turned toward the center of the stove. Use back burners when possible.  Clean up spills quickly and allow time for drying.  Avoid walking on wet floors.  Avoid hot utensils and knives.  Position shelves so they are not too high or low.  Place commonly used objects within easy reach.  If necessary, use a sturdy step stool with a grab bar when reaching.  Keep electrical cables out of the  way.  Do not use floor polish or wax that makes floors slippery. If you must use wax, use non-skid floor wax.  Remove throw rugs and tripping hazards from the floor. STAIRWAYS  Never leave objects on stairs.  Place handrails on both sides of stairways and use them. Fix any loose handrails. Make sure handrails on both sides of the stairways are as long as the stairs.  Check carpeting to make sure it is firmly attached along stairs. Make repairs to worn or loose carpet promptly.  Avoid placing throw rugs at the top or bottom of stairways, or properly secure the rug with carpet tape to prevent slippage. Get rid of throw rugs, if possible.  Have an electrician put in a light switch at the top and bottom of the stairs. OTHER FALL PREVENTION TIPS  Wear low-heel or rubber-soled shoes that are supportive and fit well. Wear closed toe shoes.  When using a stepladder, make sure it is fully opened and both spreaders are firmly locked. Do not climb a closed stepladder.  Add color or contrast paint or tape to grab bars and handrails in your home. Place contrasting color strips on first and last steps.  Learn and use mobility aids as needed. Install an electrical emergency response system.  Turn on lights to avoid dark areas. Replace light bulbs that burn out immediately. Get light switches that glow.  Arrange furniture to create clear pathways. Keep furniture in the same place.  Firmly attach carpet with non-skid or double-sided tape.  Eliminate uneven floor surfaces.  Select a carpet pattern that does not visually hide the edge of steps.  Be aware of all pets. OTHER HOME SAFETY TIPS  Set the water temperature for 120 F (48.8 C).  Keep emergency numbers on or near the telephone.  Keep smoke detectors on every level of the home and near sleeping areas. Document Released: 12/19/2001 Document Revised: 06/30/2011 Document Reviewed: 03/20/2011 [facility] Patient Information 2015  [place], [place]. This information is not intended to replace advice given to you by your health care provider. Make sure you discuss any questions you have with your health care provider.  Non-Sutured Laceration A laceration is a cut or wound that goes through all layers of the skin and into the tissue just beneath the skin. Usually, these are stitched up or held together with tape or glue shortly after the injury occurred. However, if several or more hours have passed before getting care, too many germs (bacteria) get into the laceration. Stitching it closed would bring the risk of infection. If your health care provider feels your laceration is too old, it may be left open and then bandaged to allow healing from the bottom layer up. HOME CARE INSTRUCTIONS   Change the bandage (dressing) 2 times a day or as directed by your health care provider.  If the dressing or packing gauze sticks, soak it off with soapy water.  When you re-bandage your laceration, make sure that the dressing or packing gauze goes all the way to the bottom of the laceration. The top of the laceration is kept open so it can heal from the bottom up. There is less chance for infection with this method.  Wash the area with soap and water 2 times a day to remove all the creams or ointments, if used. Rinse off the soap. Pat the area dry with a clean towel. Look for signs of infection, such as redness, swelling, or a red line that goes away from the laceration.  Re-apply creams or ointments if they were used to bandage the laceration. This helps keep the bandage from sticking.  If the bandage becomes wet, dirty, or has a bad smell, change it as soon as possible.  Only take medicine as directed by your health care provider. You might need a tetanus shot now if:  You have no idea when you had the last one.  You have never had a tetanus shot before.  Your laceration had dirt in it.  Your laceration was dirty, and your last  tetanus shot was more than 7 years ago.  Your laceration was clean, and your last tetanus shot was more than 10 years ago. If you need a tetanus shot, and you decide not to get one, there is a rare chance of getting tetanus. Sickness from tetanus can be serious. If you got a tetanus shot, your arm may swell and get red and warm to the touch at the shot site. This is common and not a problem. SEEK MEDICAL CARE IF:   You have redness, swelling, or increasing pain in the laceration.  You notice a red line that goes away from your laceration.  You have pus coming from the laceration.  You have a fever.  You notice a bad smell coming from the laceration or dressing.  You notice something coming out of the laceration, such as wood or glass.  Your laceration is on your hand or foot and you  are unable to properly move a finger or toe.  You have severe swelling around the laceration, causing pain and numbness.  You notice a change in color in your arm, hand, leg, or foot. MAKE SURE YOU:   Understand these instructions.  Will watch your condition.  Will get help right away if you are not doing well or get worse. Document Released: 11/26/2005 Document Revised: 01/03/2013 Document Reviewed: 06/18/2008 [facility]Exit[facility] Patient Information 2015 [place], [place]. This information is not intended to replace advice given to you by your health care provider. Make sure you discuss any questions you have with your health care provider.

## 2014-05-20 NOTE — ED Notes (Signed)
MD at bedside. 

## 2014-05-20 NOTE — ED Notes (Addendum)
Family reports pt fell on Friday evening. Pt reports "i passed out"- denies pain or other episodes- [facility] and was sent here for further eval- pt is here with his wife and caregiver

## 2014-05-20 NOTE — ED Notes (Signed)
MD at bedside to discuss results of testing. 

## 2014-05-20 NOTE — ED Provider Notes (Signed)
CSN: [id number]     Arrival date & time 05/20/14  1139 History   First MD Initiated Contact with Patient 05/20/14 1147     Chief Complaint  Patient presents with  . Fall     (Consider location/radiation/quality/duration/timing/severity/associated sxs/prior Treatment) Patient is a 81 y.o. male presenting with fall. The history is provided by the patient and a relative.  Fall Associated symptoms include headaches. Pertinent negatives include no chest pain.   patient reportedly had a fall 2 days ago. Patient states he did not pass out but also had told family members that he did pass out before the fall. No loss consciousness with the fall. Had some bleeding on the back of his head and has a mild headache. Seen at urgent care and sent in. No chest pain. No trouble breathing. He is on Plavix for atrial fibrillation. Has chronic atrial fibrillation.  Past Medical History  Diagnosis Date  . CAD (coronary artery disease)     a. s/p remote MI 281982  . Syncope   . TIA (transient ischemic attack)   . Zenker's diverticulum   . GERD (gastroesophageal reflux disease)   . Depression   . Nephrolithiasis   . Hyperlipidemia   . HTN (hypertension)   . Atrial fibrillation     a. Dx 04/2011  . Gallstones   . COPD (chronic obstructive pulmonary disease)   . Peptic stricture of esophagus   . CVA (cerebral infarction)   . Gastric polyp   . Bleeding   . Fall against object   . Acute blood loss anemia   . Syncope   . Hiatal hernia   . Trigeminal neuralgia   . Esophageal dysmotility   . Hiatal hernia    Past Surgical History  Procedure Laterality Date  . Laparoscopic cholecystectomy    . Implantation of a medtronic implantation loop recorder  03/01/2006    Dr. Ladona Ridgelaylor  . Esophagogastroduodenoscopy  01/24/2009    Esophageal Stricture--Multiple EGD's   Family History  Problem Relation Age of Onset  . Coronary artery disease    . Cancer Daughter     eye cancer   History  Substance Use Topics   . Smoking status: Former Smoker    Quit date: 05/07/1961  . Smokeless tobacco: Never Used  . Alcohol Use: No    Review of Systems  Constitutional: Negative for appetite change.  Respiratory: Negative for chest tightness.   Cardiovascular: Negative for chest pain.  Musculoskeletal: Negative for back pain.  Skin: Positive for wound.  Neurological: Positive for headaches.  Psychiatric/Behavioral: Negative for behavioral problems.      Allergies  Review of patient's allergies indicates no known allergies.  Home Medications   Prior to Admission medications   Medication Sig Start Date End Date Taking? Authorizing Provider  ALPRAZolam Prudy Feeler(XANAX) 0.25 MG tablet Take 0.25 mg by mouth at bedtime as needed for anxiety.   Yes Historical Provider, MD  amLODipine-atorvastatin (CADUET) 5-20 MG per tablet TAKE 1 TABLET DAILY   Yes Lewayne BuntingBrian S Crenshaw, MD  Cholecalciferol (VITAMIN D) 2000 UNITS tablet Take 2,000 Units by mouth daily.   Yes Historical Provider, MD  clopidogrel (PLAVIX) 75 MG tablet Take 1 tablet (75 mg total) by mouth daily. 03/19/13  Yes Adeline Joselyn Glassman Viyuoh, MD  Cyanocobalamin (VITAMIN B12 PO) Take 1 tablet by mouth daily.    Yes Historical Provider, MD  diphenhydrAMINE (SOMINEX) 25 MG tablet Take 25 mg by mouth at bedtime as needed for sleep.   Yes Historical Provider, MD  escitalopram (LEXAPRO) 10 MG tablet Take 1 tablet daily by mouth for anxiety. 12/06/13  Yes Mahima Pandey, MD  finasteride (PROSCAR) 5 MG tablet Take 5 mg by mouth daily. 03/11/13  Yes Historical Provider, MD  GARLIC PO Take 1 tablet by mouth daily.   Yes Historical Provider, MD  loperamide (IMODIUM A-D) 2 MG tablet Take 1 tablet (2 mg total) by mouth as needed for diarrhea or loose stools. 11/15/13  Yes Lori P Hvozdovic, PA-C  Multiple Vitamins-Minerals (PRESERVISION AREDS PO) Take by mouth 2 (two) times daily.   Yes Historical Provider, MD  omeprazole (PRILOSEC) 10 MG capsule Take 1 capsule by mouth daily. 10/08/12  Yes  Historical Provider, MD  tamsulosin (FLOMAX) 0.4 MG CAPS capsule Take 1 capsule by mouth daily. 02/05/13  Yes Historical Provider, MD  acetaminophen (TYLENOL) 650 MG CR tablet Take 1 every 6 hours by mouth as needed for mild pain or Fever ./= 101    Historical Provider, MD  ergocalciferol (VITAMIN D2) 50000 UNITS capsule Take 50,000 Units by mouth once a week.    Historical Provider, MD  gabapentin (NEURONTIN) 100 MG capsule Take 100 mg by mouth as needed.  03/13/13   Historical Provider, MD  metoprolol succinate (TOPROL-XL) 25 MG 24 hr tablet Take 25 mg by mouth daily. 01/03/13   Historical Provider, MD  Wheat Dextrin (BENEFIBER) POWD Take 1 tablespoon by mouth in water or juice once daily 11/15/13   Lori P Hvozdovic, PA-C   BP 171/77 mmHg  Pulse 62  Temp(Src) 97.7 F (36.5 C) (Oral)  Resp 18  Ht  (1.727 m)  Wt 115 lb (52.164 kg)  BMI 17.49 kg/m2  SpO2 99% Physical Exam  Constitutional: He appears well-developed and well-nourished.  HENT:  Blood clots in hair on left posterior scalp.  Cardiovascular: Normal rate.   Pulmonary/Chest: Effort normal.  Device and left anterior chest wall.  Abdominal: Soft.  Musculoskeletal: He exhibits no edema.  Neurological: He is alert.  Patient is awake and pleasant but somewhat hard of hearing.  Skin: Skin is warm.    ED Course  Procedures (including critical care time) Labs Review Labs Reviewed - No data to display  Imaging Review Ct Head Wo Contrast  05/20/2014   CLINICAL DATA:  Intermittent headache following a fall 2 nights ago due to syncope. He hit his head when he fell.  EXAM: CT HEAD WITHOUT CONTRAST  TECHNIQUE: Contiguous axial images were obtained from the base of the skull through the vertex without intravenous contrast.  COMPARISON:  03/12/2013  FINDINGS: Diffusely enlarged ventricles and subarachnoid spaces. Patchy white matter low density in both cerebral hemispheres. Interval left lateral subdural hygroma measuring 6 mm in  maximum thickness. No acute intracranial hemorrhage, mass lesion, skull fracture, evidence of acute infarction or paranasal sinus air-fluid levels. Left maxillary sinus retention cyst and minimal right sphenoid sinus mucosal thickening or retention cyst.  IMPRESSION: 1. No skull fracture or intracranial hemorrhage. 2. Interval 6 mm thick left lateral subdural hygroma. 3. Stable atrophy and chronic small vessel white matter ischemic changes.   Electronically Signed   By: Beckie Salts M.D.   On: 05/20/2014 12:40     EKG Interpretation   Date/Time:  Sunday May 20 2014 12:09:29 EDT Ventricular Rate:  76 PR Interval:    QRS Duration: 112 QT Interval:  420 QTC Calculation: 472 R Axis:   -57 Text Interpretation:  Atrial fibrillation with a competing junctional  pacemaker with premature ventricular or aberrantly conducted complexes  Left anterior fascicular block Abnormal ECG Confirmed by Rubin PayorPICKERING  MD,  Harrold DonathNATHAN [phone]([id number]) on 05/20/2014 1:28:56 PM      MDM   Final diagnoses:  Fall, initial encounter  Scalp laceration, initial encounter    Patient with fall. Has small laceration to scalp but not in a window to close it. Head CT shows only hygroma. Will discharge home.   Benjiman CoreNathan Macgregor Aeschliman, MD 05/20/14 1329

## 2014-05-20 NOTE — ED Notes (Signed)
Patient transported to CT via stretcher per tech. 

## 2014-06-17 ENCOUNTER — Inpatient Hospital Stay (HOSPITAL_COMMUNITY)
Admission: EM | Admit: 2014-06-17 | Discharge: 2014-06-21 | DRG: 291 | Disposition: A | Discharge status: Home Health | Payer: Medicare Other | Attending: Internal Medicine | Admitting: Internal Medicine

## 2014-06-17 ENCOUNTER — Emergency Department (HOSPITAL_COMMUNITY): Payer: Medicare Other

## 2014-06-17 ENCOUNTER — Encounter (HOSPITAL_COMMUNITY): Payer: Self-pay | Admitting: Emergency Medicine

## 2014-06-17 DIAGNOSIS — Z791 Long term (current) use of non-steroidal anti-inflammatories (NSAID): Secondary | ICD-10-CM | POA: Diagnosis not present

## 2014-06-17 DIAGNOSIS — Z87891 Personal history of nicotine dependence: Secondary | ICD-10-CM

## 2014-06-17 DIAGNOSIS — J9600 Acute respiratory failure, unspecified whether with hypoxia or hypercapnia: Secondary | ICD-10-CM | POA: Diagnosis present

## 2014-06-17 DIAGNOSIS — K219 Gastro-esophageal reflux disease without esophagitis: Secondary | ICD-10-CM

## 2014-06-17 DIAGNOSIS — L8990 Pressure ulcer of unspecified site, unspecified stage: Secondary | ICD-10-CM | POA: Diagnosis present

## 2014-06-17 DIAGNOSIS — Z87442 Personal history of urinary calculi: Secondary | ICD-10-CM | POA: Diagnosis not present

## 2014-06-17 DIAGNOSIS — E785 Hyperlipidemia, unspecified: Secondary | ICD-10-CM | POA: Diagnosis present

## 2014-06-17 DIAGNOSIS — Z7902 Long term (current) use of antithrombotics/antiplatelets: Secondary | ICD-10-CM

## 2014-06-17 DIAGNOSIS — R001 Bradycardia, unspecified: Secondary | ICD-10-CM | POA: Diagnosis not present

## 2014-06-17 DIAGNOSIS — Z79899 Other long term (current) drug therapy: Secondary | ICD-10-CM

## 2014-06-17 DIAGNOSIS — I252 Old myocardial infarction: Secondary | ICD-10-CM | POA: Diagnosis not present

## 2014-06-17 DIAGNOSIS — I482 Chronic atrial fibrillation: Secondary | ICD-10-CM | POA: Diagnosis present

## 2014-06-17 DIAGNOSIS — I4891 Unspecified atrial fibrillation: Secondary | ICD-10-CM | POA: Diagnosis present

## 2014-06-17 DIAGNOSIS — F329 Major depressive disorder, single episode, unspecified: Secondary | ICD-10-CM | POA: Diagnosis present

## 2014-06-17 DIAGNOSIS — Z8673 Personal history of transient ischemic attack (TIA), and cerebral infarction without residual deficits: Secondary | ICD-10-CM

## 2014-06-17 DIAGNOSIS — E876 Hypokalemia: Secondary | ICD-10-CM | POA: Diagnosis present

## 2014-06-17 DIAGNOSIS — I5033 Acute on chronic diastolic (congestive) heart failure: Secondary | ICD-10-CM | POA: Diagnosis present

## 2014-06-17 DIAGNOSIS — Z8249 Family history of ischemic heart disease and other diseases of the circulatory system: Secondary | ICD-10-CM | POA: Diagnosis not present

## 2014-06-17 DIAGNOSIS — G500 Trigeminal neuralgia: Secondary | ICD-10-CM | POA: Diagnosis present

## 2014-06-17 DIAGNOSIS — E538 Deficiency of other specified B group vitamins: Secondary | ICD-10-CM | POA: Diagnosis present

## 2014-06-17 DIAGNOSIS — Z66 Do not resuscitate: Secondary | ICD-10-CM | POA: Diagnosis present

## 2014-06-17 DIAGNOSIS — I2510 Atherosclerotic heart disease of native coronary artery without angina pectoris: Secondary | ICD-10-CM | POA: Diagnosis present

## 2014-06-17 DIAGNOSIS — N400 Enlarged prostate without lower urinary tract symptoms: Secondary | ICD-10-CM | POA: Diagnosis present

## 2014-06-17 DIAGNOSIS — J690 Pneumonitis due to inhalation of food and vomit: Secondary | ICD-10-CM | POA: Diagnosis not present

## 2014-06-17 DIAGNOSIS — I509 Heart failure, unspecified: Secondary | ICD-10-CM

## 2014-06-17 DIAGNOSIS — J449 Chronic obstructive pulmonary disease, unspecified: Secondary | ICD-10-CM

## 2014-06-17 DIAGNOSIS — I10 Essential (primary) hypertension: Secondary | ICD-10-CM

## 2014-06-17 DIAGNOSIS — R0602 Shortness of breath: Secondary | ICD-10-CM | POA: Diagnosis present

## 2014-06-17 LAB — COMPREHENSIVE METABOLIC PANEL
ALBUMIN: 3.8 g/dL (ref 3.5–5.0)
ALT: 13 U/L — AB (ref 17–63)
AST: 15 U/L (ref 15–41)
Alkaline Phosphatase: 94 U/L (ref 38–126)
Anion gap: 9 (ref 5–15)
BILIRUBIN TOTAL: 0.8 mg/dL (ref 0.3–1.2)
BUN: 28 mg/dL — ABNORMAL HIGH (ref 6–20)
CALCIUM: 9.1 mg/dL (ref 8.9–10.3)
CO2: 27 mmol/L (ref 22–32)
CREATININE: 0.79 mg/dL (ref 0.61–1.24)
Chloride: 108 mmol/L (ref 101–111)
GFR calc Af Amer: >60 mL/min (ref >60)
GFR calc non Af Amer: >60 mL/min (ref >60)
Glucose, Bld: 104 mg/dL — ABNORMAL HIGH (ref 65–99)
Potassium: 3.9 mmol/L (ref 3.5–5.1)
Sodium: 144 mmol/L (ref 135–145)
TOTAL PROTEIN: 7.3 g/dL (ref 6.5–8.1)

## 2014-06-17 LAB — BRAIN NATRIURETIC PEPTIDE: B NATRIURETIC PEPTIDE 5: 958.8 pg/mL — AB (ref 0.0–100.0)

## 2014-06-17 LAB — CBC
HCT: 35.3 % — ABNORMAL LOW (ref 39.0–52.0)
Hemoglobin: 11 g/dL — ABNORMAL LOW (ref 13.0–17.0)
MCH: 31.4 pg (ref 26.0–34.0)
MCHC: 31.2 g/dL (ref 30.0–36.0)
MCV: 100.9 fL — AB (ref 78.0–100.0)
Platelets: 152 10*3/uL (ref 150–400)
RBC: 3.5 MIL/uL — ABNORMAL LOW (ref 4.22–5.81)
RDW: 13.9 % (ref 11.5–15.5)
WBC: 7.6 10*3/uL (ref 4.0–10.5)

## 2014-06-17 LAB — I-STAT TROPONIN, ED: Troponin i, poc: 0.03 ng/mL (ref 0.00–0.08)

## 2014-06-17 LAB — TROPONIN I: Troponin I: <0.03 ng/mL (ref <0.031)

## 2014-06-17 MED ORDER — ESCITALOPRAM OXALATE 10 MG PO TABS
10.0000 mg | ORAL_TABLET | Freq: Every day | ORAL | Status: DC
  Administered 2014-06-17 – 2014-06-20 (×4): 10 mg via ORAL
  Filled 2014-06-17 (×4): qty 1

## 2014-06-17 MED ORDER — FUROSEMIDE 10 MG/ML IJ SOLN
40.0000 mg | Freq: Once | INTRAMUSCULAR | Status: AC
  Administered 2014-06-17: 40 mg via INTRAVENOUS
  Filled 2014-06-17: qty 4

## 2014-06-17 MED ORDER — HEPARIN SODIUM (PORCINE) 5000 UNIT/ML IJ SOLN
5000.0000 [IU] | Freq: Three times a day (TID) | INTRAMUSCULAR | Status: DC
  Administered 2014-06-17 – 2014-06-21 (×11): 5000 [IU] via SUBCUTANEOUS
  Filled 2014-06-17 (×12): qty 1

## 2014-06-17 MED ORDER — LOPERAMIDE HCL 2 MG PO CAPS: 2.0000 mg | ORAL_CAPSULE | ORAL | Status: DC | PRN

## 2014-06-17 MED ORDER — ALPRAZOLAM 0.25 MG PO TABS
0.2500 mg | ORAL_TABLET | Freq: Every day | ORAL | Status: DC
  Administered 2014-06-17 – 2014-06-20 (×4): 0.25 mg via ORAL
  Filled 2014-06-17 (×4): qty 1

## 2014-06-17 MED ORDER — LISINOPRIL 2.5 MG PO TABS
2.5000 mg | ORAL_TABLET | Freq: Every day | ORAL | Status: DC
  Administered 2014-06-17 – 2014-06-21 (×5): 2.5 mg via ORAL
  Filled 2014-06-17 (×5): qty 1

## 2014-06-17 MED ORDER — CLOPIDOGREL BISULFATE 75 MG PO TABS
75.0000 mg | ORAL_TABLET | Freq: Every day | ORAL | Status: DC
  Administered 2014-06-18 – 2014-06-21 (×4): 75 mg via ORAL
  Filled 2014-06-17 (×4): qty 1

## 2014-06-17 MED ORDER — NITROGLYCERIN 2 % TD OINT
0.5000 [in_us] | TOPICAL_OINTMENT | Freq: Four times a day (QID) | TRANSDERMAL | Status: DC
  Administered 2014-06-17 – 2014-06-18 (×3): 0.5 [in_us] via TOPICAL
  Filled 2014-06-17: qty 30

## 2014-06-17 MED ORDER — TAMSULOSIN HCL 0.4 MG PO CAPS
0.4000 mg | ORAL_CAPSULE | Freq: Every day | ORAL | Status: DC
  Administered 2014-06-18 – 2014-06-20 (×3): 0.4 mg via ORAL
  Filled 2014-06-17 (×4): qty 1

## 2014-06-17 MED ORDER — FUROSEMIDE 10 MG/ML IJ SOLN
40.0000 mg | Freq: Two times a day (BID) | INTRAMUSCULAR | Status: DC
  Administered 2014-06-18 – 2014-06-19 (×3): 40 mg via INTRAVENOUS
  Filled 2014-06-17 (×3): qty 4

## 2014-06-17 MED ORDER — METOPROLOL TARTRATE 1 MG/ML IV SOLN
5.0000 mg | INTRAVENOUS | Status: DC | PRN
Start: 2014-06-17 — End: 2014-06-21

## 2014-06-17 MED ORDER — ATORVASTATIN CALCIUM 20 MG PO TABS
20.0000 mg | ORAL_TABLET | Freq: Every day | ORAL | Status: DC
  Administered 2014-06-17 – 2014-06-20 (×4): 20 mg via ORAL
  Filled 2014-06-17 (×5): qty 1

## 2014-06-17 MED ORDER — IPRATROPIUM-ALBUTEROL 0.5-2.5 (3) MG/3ML IN SOLN
3.0000 mL | Freq: Once | RESPIRATORY_TRACT | Status: AC
  Administered 2014-06-17: 3 mL via RESPIRATORY_TRACT
  Filled 2014-06-17: qty 3

## 2014-06-17 MED ORDER — ALBUTEROL SULFATE (2.5 MG/3ML) 0.083% IN NEBU: 2.5000 mg | INHALATION_SOLUTION | RESPIRATORY_TRACT | Status: DC | PRN

## 2014-06-17 MED ORDER — PANTOPRAZOLE SODIUM 40 MG PO TBEC
40.0000 mg | DELAYED_RELEASE_TABLET | Freq: Every day | ORAL | Status: DC
  Administered 2014-06-17 – 2014-06-21 (×5): 40 mg via ORAL
  Filled 2014-06-17 (×5): qty 1

## 2014-06-17 MED ORDER — ONDANSETRON HCL 4 MG/2ML IJ SOLN: 4.0000 mg | Freq: Four times a day (QID) | INTRAMUSCULAR | Status: DC | PRN

## 2014-06-17 MED ORDER — FINASTERIDE 5 MG PO TABS
5.0000 mg | ORAL_TABLET | Freq: Every day | ORAL | Status: DC
  Administered 2014-06-18 – 2014-06-21 (×4): 5 mg via ORAL
  Filled 2014-06-17 (×4): qty 1

## 2014-06-17 MED ORDER — METOPROLOL TARTRATE 50 MG PO TABS
50.0000 mg | ORAL_TABLET | Freq: Two times a day (BID) | ORAL | Status: DC
  Administered 2014-06-17: 50 mg via ORAL
  Filled 2014-06-17: qty 1
  Filled 2014-06-17: qty 2

## 2014-06-17 MED ORDER — VITAMIN B-12 100 MCG PO TABS
100.0000 ug | ORAL_TABLET | Freq: Every day | ORAL | Status: DC
  Administered 2014-06-18 – 2014-06-21 (×4): 100 ug via ORAL
  Filled 2014-06-17 (×4): qty 1

## 2014-06-17 NOTE — H&P (Addendum)
Patient Demographics  Nathan Mills, is a 81 y.o. male  MRN: [id number]   DOB - Dec 25, 1917  Admit Date - 06/17/2014  Outpatient Primary MD for the patient is  Duane Lope, MD   With History of -  Past Medical History  Diagnosis Date  . CAD (coronary artery disease)     a. s/p remote MI 52  . Syncope   . TIA (transient ischemic attack)   . Zenker's diverticulum   . GERD (gastroesophageal reflux disease)   . Depression   . Nephrolithiasis   . Hyperlipidemia   . HTN (hypertension)   . Atrial fibrillation     a. Dx 04/2011  . Gallstones   . COPD (chronic obstructive pulmonary disease)   . Peptic stricture of esophagus   . CVA (cerebral infarction)   . Gastric polyp   . Bleeding   . Fall against object   . Acute blood loss anemia   . Syncope   . Hiatal hernia   . Trigeminal neuralgia   . Esophageal dysmotility   . Hiatal hernia       Past Surgical History  Procedure Laterality Date  . Laparoscopic cholecystectomy    . Implantation of a medtronic implantation loop recorder  03/01/2006    Dr. Ladona Ridgel  . Esophagogastroduodenoscopy  01/24/2009    Esophageal Stricture--Multiple EGD's    in for   Chief Complaint  Patient presents with  . Shortness of Breath     HPI  Nathan Mills  is a 81 y.o. male, with history of chronic atrial fibrillation [place] Vasc score of 3 not on anticoagulation, on in diastolic CHF last EF of 55% few years ago, history of loop recorder placement in the past, hiatal hernia, GERD, CAD, who lives at his home with his wife and a bit with the help of a walker.  Initially went to an outpatient urgent care for shortness of breath and cough, he was diagnosed with CHF and sent to the ER. In the ER workup confirmed acute on chronic diastolic CHF. I was called to admit the  patient.  And currently denies any headache, no fever chills, does have a dry cough and some exertional shortness of breath, mild orthopnea, denies any palpitations or chest pain, no abdominal pain, no blood in stool or urine. No focal weakness.    Review of Systems    In addition to the HPI above,   No Fever-chills, No Headache, No changes with Vision or hearing, No problems swallowing food or Liquids, No Chest pain, ++ Cough & Shortness of Breath, No Abdominal pain, No Nausea or Vommitting, Bowel movements are regular, No Blood in stool or Urine, No dysuria, No new skin rashes or bruises, No new joints pains-aches,  No new weakness, tingling, numbness in any extremity, No recent weight gain or loss, No polyuria, polydypsia or polyphagia, No significant Mental Stressors.  A full 10 point Review of Systems was  done, except as stated above, all other Review of Systems were negative.   Social History History  Substance Use Topics  . Smoking status: Former Smoker    Quit date: 05/07/1961  . Smokeless tobacco: Never Used  . Alcohol Use: No    Lives - lives at his home with his wife, ambulates with the help of a walker     Family History Family History  Problem Relation Age of Onset  . Coronary artery disease    . Cancer Daughter     eye cancer      Prior to Admission medications   Medication Sig Start Date End Date Taking? Authorizing Provider  ALPRAZolam (XANAX) 0.25 MG tablet Take 0.25 mg by mouth at bedtime.    Yes Historical Provider, MD  amLODipine-atorvastatin (CADUET) 5-20 MG per tablet TAKE 1 TABLET DAILY Patient taking differently: TAKE 1 TABLET BY MOUTH DAILY   Yes Lewayne BuntingBrian S Crenshaw, MD  Cholecalciferol (VITAMIN D) 2000 UNITS tablet Take 2,000 Units by mouth daily.   Yes Historical Provider, MD  clopidogrel (PLAVIX) 75 MG tablet Take 1 tablet (75 mg total) by mouth daily. 03/19/13  Yes Adeline Joselyn Glassman Viyuoh, MD  Cyanocobalamin (VITAMIN B12 PO) Take 1 tablet by  mouth daily.    Yes Historical Provider, MD  escitalopram (LEXAPRO) 10 MG tablet Take 1 tablet daily by mouth for anxiety. 12/06/13  Yes Mahima Pandey, MD  finasteride (PROSCAR) 5 MG tablet Take 5 mg by mouth daily. 03/11/13  Yes Historical Provider, MD  GARLIC PO Take 1 tablet by mouth daily.   Yes Historical Provider, MD  Ibuprofen-Diphenhydramine Cit 200-38 MG TABS Take 1 tablet by mouth at bedtime.   Yes Historical Provider, MD  metoprolol succinate (TOPROL-XL) 25 MG 24 hr tablet Take 25 mg by mouth daily. 01/03/13  Yes Historical Provider, MD  omeprazole (PRILOSEC) 10 MG capsule Take 1 capsule by mouth daily. 10/08/12  Yes Historical Provider, MD  tamsulosin (FLOMAX) 0.4 MG CAPS capsule Take 1 capsule by mouth daily. 02/05/13  Yes Historical Provider, MD  loperamide (IMODIUM A-D) 2 MG tablet Take 1 tablet (2 mg total) by mouth as needed for diarrhea or loose stools. 11/15/13   Lori P Hvozdovic, PA-C  Wheat Dextrin (BENEFIBER) POWD Take 1 tablespoon by mouth in water or juice once daily 11/15/13   Lori P Hvozdovic, PA-C    No Known Allergies  Physical Exam  Vitals  Blood pressure 172/82, pulse 78, temperature 98.3 F (36.8 C), temperature source Oral, resp. rate 19, SpO2 93 %.   1. General frail lederly white male lying in bed in NAD,     2. Normal affect and insight, Not Suicidal or Homicidal, Awake Alert, Oriented X 2.  3. No F.N deficits, ALL C.Nerves Intact, Strength 5/5 all 4 extremities, Sensation intact all 4 extremities, Plantars down going.  4. Ears and Eyes appear Normal, Conjunctivae clear, PERRLA. Moist Oral Mucosa.  5. Supple Neck, No JVD, No cervical lymphadenopathy appriciated, No Carotid Bruits.  6. Symmetrical Chest wall movement, Good air movement bilaterally, +ve rales  7. iRRR, No Gallops, Rubs or Murmurs, No Parasternal Heave.  8. Positive Bowel Sounds, Abdomen Soft, No tenderness, No organomegaly appriciated,No rebound -guarding or rigidity.  9.  No  Cyanosis, Normal Skin Turgor, No Skin Rash or Bruise.  10. Good muscle tone,  joints appear normal , no effusions, Normal ROM.  11. No Palpable Lymph Nodes in Neck or Axillae     Data Review  CBC  Recent Labs  Lab 06/17/14 1351  WBC 7.6  HGB 11.0*  HCT 35.3*  PLT 152  MCV 100.9*  MCH 31.4  MCHC 31.2  RDW 13.9   ------------------------------------------------------------------------------------------------------------------  Chemistries   Recent Labs Lab 06/17/14 1351  NA 144  K 3.9  CL 108  CO2 27  GLUCOSE 104*  BUN 28*  CREATININE 0.79  CALCIUM 9.1  AST 15  ALT 13*  ALKPHOS 94  BILITOT 0.8   ------------------------------------------------------------------------------------------------------------------ CrCl cannot be calculated (Unknown ideal weight.). ------------------------------------------------------------------------------------------------------------------ No results for input(s): TSH, T4TOTAL, T3FREE, THYROIDAB in the last 72 hours.  Invalid input(s): FREET3   Coagulation profile No results for input(s): INR, PROTIME in the last 168 hours. ------------------------------------------------------------------------------------------------------------------- No results for input(s): DDIMER in the last 72 hours. -------------------------------------------------------------------------------------------------------------------  Cardiac Enzymes No results for input(s): CKMB, TROPONINI, MYOGLOBIN in the last 168 hours.  Invalid input(s): CK ------------------------------------------------------------------------------------------------------------------ Invalid input(s): POCBNP   ---------------------------------------------------------------------------------------------------------------  Urinalysis    Component Value Date/Time   COLORURINE YELLOW 09/15/2009 2043   APPEARANCEUR CLEAR 09/15/2009 2043   LABSPEC 1.008 09/15/2009 2043    PHURINE 7.0 09/15/2009 2043   GLUCOSEU NEGATIVE 09/15/2009 2043   HGBUR NEGATIVE 09/15/2009 2043   BILIRUBINUR NEGATIVE 09/15/2009 2043   KETONESUR NEGATIVE 09/15/2009 2043   PROTEINUR NEGATIVE 09/15/2009 2043   UROBILINOGEN 0.2 09/15/2009 2043   NITRITE NEGATIVE 09/15/2009 2043   LEUKOCYTESUR TRACE* 09/15/2009 2043    ----------------------------------------------------------------------------------------------------------------  Imaging results:   Dg Chest 2 View  06/17/2014   CLINICAL DATA:  Shortness of breath.  Productive cough.  EXAM: CHEST  2 VIEW  COMPARISON:  Chest x-rays dated 06/17/2014 and 05/18/2011  FINDINGS: There is chronic cardiomegaly. COPD. Small to moderate right pleural effusion layered posteriorly on the lateral view. Some fluid extends along the right minor fissure. No consolidative infiltrates. Chronic peribronchial thickening. Old compression fractures in the mid thoracic spine. Osteopenia. Calcification of the thoracic aorta.  IMPRESSION: Small to moderate right pleural effusion.  COPD.   Electronically Signed   By: Francene Boyers M.D.   On: 06/17/2014 15:47    My personal review of EKG: Rhythm Afib, Rate  76 /min, non specific ST changes    Assessment & Plan   1. Acute on chronic diastolic CHF last EF 50-55% few years ago (2014). Will be admitted to telemetry bed, salt and fluid restriction, IV Lasix, continue beta blocker, Nitropaste and low-dose ace. Check echogram and cycle troponin. Cardiology to evaluate in the morning.   2. CAD . Stable. No acute issues, no chest pain EKG nonacute. Cycle troponin 2. Will place on Plavix, beta blocker, statin at home doseand & monitor.   3.Dyslipidemia - continue Statin.   4.BPH - continue Alfa blocker.   5. B-12 deficiency. Continue home dose B-12 supplementation.   6. Essential hypertension. Continue beta blocker at a higher dose of better rate control, Nitropaste added, low-dose ace added for CHF  monitor.   7. Chronic atrial fibrillation. [place] VASC 2 score is 2. Not on anticoagulation due to advanced age and high fall risk and bleeding. On Plavix continue.     DVT Prophylaxis Heparin   AM Labs Ordered, also please review Full Orders  Family Communication: Admission, patients condition and plan of care including tests being ordered have been discussed with the patient and son in law who indicate understanding and agree with the plan and Code Status.  Code Status DNR  Likely DC to  TBD  Condition GUARDED     Time spent in minutes : 35  Leroy Sea M.D on 06/17/2014 at 4:46 PM  Between 7am to 7pm - Pager - [phone]  After 7pm go to [email or web address] - password [facility]  [facility]  Office  [phone]

## 2014-06-17 NOTE — ED Provider Notes (Signed)
CSN: [id number]     Arrival date & time 06/17/14  1243 History   First MD Initiated Contact with Patient 06/17/14 1412     Chief Complaint  Patient presents with  . Shortness of Breath    HPI Patient presents to the emergency room because of progressive shortness of breath over the last week. The patient has been having trouble with cough. He has had dyspnea on exertion. He has had some intermittent leg swelling.Denies any specific trouble with chest pain, fevers. Patient went to the [facility]. He was told that he might have pneumonia and was sent here for further evaluation Past Medical History  Diagnosis Date  . CAD (coronary artery disease)     a. s/p remote MI 881982  . Syncope   . TIA (transient ischemic attack)   . Zenker's diverticulum   . GERD (gastroesophageal reflux disease)   . Depression   . Nephrolithiasis   . Hyperlipidemia   . HTN (hypertension)   . Atrial fibrillation     a. Dx 04/2011  . Gallstones   . COPD (chronic obstructive pulmonary disease)   . Peptic stricture of esophagus   . CVA (cerebral infarction)   . Gastric polyp   . Bleeding   . Fall against object   . Acute blood loss anemia   . Syncope   . Hiatal hernia   . Trigeminal neuralgia   . Esophageal dysmotility   . Hiatal hernia    Past Surgical History  Procedure Laterality Date  . Laparoscopic cholecystectomy    . Implantation of a medtronic implantation loop recorder  03/01/2006    Dr. Ladona Ridgelaylor  . Esophagogastroduodenoscopy  01/24/2009    Esophageal Stricture--Multiple EGD's   Family History  Problem Relation Age of Onset  . Coronary artery disease    . Cancer Daughter     eye cancer   History  Substance Use Topics  . Smoking status: Former Smoker    Quit date: 05/07/1961  . Smokeless tobacco: Never Used  . Alcohol Use: No    Review of Systems  All other systems reviewed and are negative.     Allergies  Review of patient's allergies indicates no known  allergies.  Home Medications   Prior to Admission medications   Medication Sig Start Date End Date Taking? Authorizing Provider  ALPRAZolam (XANAX) 0.25 MG tablet Take 0.25 mg by mouth at bedtime.    Yes Historical Provider, MD  amLODipine-atorvastatin (CADUET) 5-20 MG per tablet TAKE 1 TABLET DAILY Patient taking differently: TAKE 1 TABLET BY MOUTH DAILY   Yes Lewayne BuntingBrian S Crenshaw, MD  Cholecalciferol (VITAMIN D) 2000 UNITS tablet Take 2,000 Units by mouth daily.   Yes Historical Provider, MD  clopidogrel (PLAVIX) 75 MG tablet Take 1 tablet (75 mg total) by mouth daily. 03/19/13  Yes Adeline Joselyn Glassman Viyuoh, MD  Cyanocobalamin (VITAMIN B12 PO) Take 1 tablet by mouth daily.    Yes Historical Provider, MD  escitalopram (LEXAPRO) 10 MG tablet Take 1 tablet daily by mouth for anxiety. 12/06/13  Yes Mahima Pandey, MD  finasteride (PROSCAR) 5 MG tablet Take 5 mg by mouth daily. 03/11/13  Yes Historical Provider, MD  GARLIC PO Take 1 tablet by mouth daily.   Yes Historical Provider, MD  Ibuprofen-Diphenhydramine Cit 200-38 MG TABS Take 1 tablet by mouth at bedtime.   Yes Historical Provider, MD  metoprolol succinate (TOPROL-XL) 25 MG 24 hr tablet Take 25 mg by mouth daily. 01/03/13  Yes Historical Provider, MD  omeprazole (PRILOSEC) 10 MG capsule Take 1 capsule by mouth daily. 10/08/12  Yes Historical Provider, MD  tamsulosin (FLOMAX) 0.4 MG CAPS capsule Take 1 capsule by mouth daily. 02/05/13  Yes Historical Provider, MD  loperamide (IMODIUM A-D) 2 MG tablet Take 1 tablet (2 mg total) by mouth as needed for diarrhea or loose stools. 11/15/13   Lori P Hvozdovic, PA-C  Wheat Dextrin (BENEFIBER) POWD Take 1 tablespoon by mouth in water or juice once daily 11/15/13   Lori P Hvozdovic, PA-C   BP 172/82 mmHg  Pulse 78  Temp(Src) 98.3 F (36.8 C) (Oral)  Resp 19  SpO2 93% Physical Exam  Constitutional: No distress.  Elderly, frail  HENT:  Head: Normocephalic and atraumatic.  Right Ear: External ear normal.  Left  Ear: External ear normal.  Mouth/Throat: No oropharyngeal exudate.  Eyes: Conjunctivae are normal. Right eye exhibits no discharge. Left eye exhibits no discharge. No scleral icterus.  Neck: Neck supple. No tracheal deviation present.  Cardiovascular: Normal rate, regular rhythm and intact distal pulses.   Pulmonary/Chest: Effort normal. No stridor. No respiratory distress. He has wheezes. He has rhonchi. He has rales.  Abdominal: Soft. Bowel sounds are normal. He exhibits no distension. There is no tenderness. There is no rebound and no guarding.  Musculoskeletal: He exhibits edema (trace). He exhibits no tenderness.  Neurological: He is alert. He has normal strength. No cranial nerve deficit (no facial droop, extraocular movements intact, no slurred speech) or sensory deficit. He exhibits normal muscle tone. He displays no seizure activity. Coordination normal.  Skin: Skin is warm and dry. No rash noted.  Psychiatric: He has a normal mood and affect.  Nursing note and vitals reviewed.   ED Course  Procedures (including critical care time) Labs Review Labs Reviewed  CBC - Abnormal; Notable for the following:    RBC 3.50 (*)    Hemoglobin 11.0 (*)    HCT 35.3 (*)    MCV 100.9 (*)    All other components within normal limits  COMPREHENSIVE METABOLIC PANEL - Abnormal; Notable for the following:    Glucose, Bld 104 (*)    BUN 28 (*)    ALT 13 (*)    All other components within normal limits  BRAIN NATRIURETIC PEPTIDE - Abnormal; Notable for the following:    B Natriuretic Peptide 958.8 (*)    All other components within normal limits  I-STAT TROPOININ, ED    Imaging Review Dg Chest 2 View  06/17/2014   CLINICAL DATA:  Shortness of breath.  Productive cough.  EXAM: CHEST  2 VIEW  COMPARISON:  Chest x-rays dated 06/17/2014 and 05/18/2011  FINDINGS: There is chronic cardiomegaly. COPD. Small to moderate right pleural effusion layered posteriorly on the lateral view. Some fluid extends  along the right minor fissure. No consolidative infiltrates. Chronic peribronchial thickening. Old compression fractures in the mid thoracic spine. Osteopenia. Calcification of the thoracic aorta.  IMPRESSION: Small to moderate right pleural effusion.  COPD.   Electronically Signed   By: Francene Boyers M.D.   On: 06/17/2014 15:47     EKG Interpretation   Date/Time:  Sunday June 17 2014 13:04:44 EDT Ventricular Rate:  76 PR Interval:    QRS Duration: 137 QT Interval:  399 QTC Calculation: 449 R Axis:   -56 Text Interpretation:  Atrial fibrillation Ventricular premature complex  Nonspecific IVCD with LAD Left ventricular hypertrophy No significant  change since last tracing Confirmed by Donnie Panik  MD-J, Linford Quintela ([id number]) on  06/17/2014  4:17:04 PM     Medications  ipratropium-albuterol (DUONEB) 0.5-2.5 (3) MG/3ML nebulizer solution 3 mL (not administered)  furosemide (LASIX) injection 40 mg (40 mg Intravenous Given 06/17/14 1615)    MDM   Final diagnoses:  Chronic obstructive pulmonary disease, unspecified COPD, unspecified chronic bronchitis type  Acute congestive heart failure, unspecified congestive heart failure type    The patient's x-ray shows pleural effusion on the right side. This may be related to congestive heart failure. Patient does have chronic A. Fib. He has some mild peripheral edema. Plan to start him on IV Lasix.  Suspect he has a component of COPD as well. He did have wheezing on exam. Start on albuterol breathing treatments.  Considering his age and comorbidities I'll consult the medical service regarding admission for further treatment    Linwood Dibbles, MD 06/17/14 [phone]

## 2014-06-17 NOTE — ED Notes (Signed)
Pt c/o SOB x 1 week. [facility], had xray done and was sent here to be evaluated for possible PNA. Pt A&Ox4. NAD noted. Pt denies chest pain. Pt also c/o productive cough x 1 week.

## 2014-06-17 NOTE — Progress Notes (Signed)
Patient arrived in [id number] from ED at 1900, patient alert in no distress, helped setup in in room and reported given to night shift nurse to F/U with plan of care.

## 2014-06-18 ENCOUNTER — Inpatient Hospital Stay (HOSPITAL_COMMUNITY): Payer: Medicare Other

## 2014-06-18 DIAGNOSIS — I4891 Unspecified atrial fibrillation: Secondary | ICD-10-CM

## 2014-06-18 DIAGNOSIS — R001 Bradycardia, unspecified: Secondary | ICD-10-CM

## 2014-06-18 LAB — BASIC METABOLIC PANEL
Anion gap: 10 (ref 5–15)
BUN: 26 mg/dL — ABNORMAL HIGH (ref 6–20)
CHLORIDE: 103 mmol/L (ref 101–111)
CO2: 28 mmol/L (ref 22–32)
Calcium: 8.7 mg/dL — ABNORMAL LOW (ref 8.9–10.3)
Creatinine, Ser: 0.82 mg/dL (ref 0.61–1.24)
GFR calc Af Amer: >60 mL/min (ref >60)
Glucose, Bld: 122 mg/dL — ABNORMAL HIGH (ref 65–99)
Potassium: 3.6 mmol/L (ref 3.5–5.1)
Sodium: 141 mmol/L (ref 135–145)

## 2014-06-18 LAB — MAGNESIUM: MAGNESIUM: 2 mg/dL (ref 1.7–2.4)

## 2014-06-18 LAB — TROPONIN I: Troponin I: <0.03 ng/mL (ref <0.031)

## 2014-06-18 MED ORDER — METOPROLOL TARTRATE 25 MG PO TABS
12.5000 mg | ORAL_TABLET | Freq: Two times a day (BID) | ORAL | Status: DC
  Administered 2014-06-18 – 2014-06-20 (×5): 12.5 mg via ORAL
  Filled 2014-06-18 (×6): qty 1

## 2014-06-18 MED ORDER — POTASSIUM CHLORIDE CRYS ER 10 MEQ PO TBCR
10.0000 meq | EXTENDED_RELEASE_TABLET | Freq: Two times a day (BID) | ORAL | Status: DC
  Administered 2014-06-18 – 2014-06-20 (×5): 10 meq via ORAL
  Filled 2014-06-18 (×5): qty 1

## 2014-06-18 NOTE — Progress Notes (Signed)
Patient's heart rate in the 40s, metoprolol due to be given, held and notified Dr. Lendell CapriceSullivan. Will continue to F/U with plan of care.

## 2014-06-18 NOTE — Progress Notes (Signed)
[facility] PROGRESS NOTE  Nathan Mills [id number] DOB: 1917-06-02 DOA: 06/17/2014 PCP:  Duane Lopeoss, Alan, MD  Summary 81 year old white male with history of chronic diastolic heart failure admitted with acute on chronic heart failure.  Assessment/Plan:  Principal Problem:   Acute on chronic diastolic CHF (congestive heart failure), :  Patient reports his symptoms are improved, but family reports that they noticed he was "breathing hard". Will check chest x-ray tomorrow. Continue IV Lasix twice a day. Monitor creatinine and electrolytes. Await repeat echocardiogram. Will get physical therapy evaluation. Active Problems:   Essential hypertension   Coronary atherosclerosis   GERD   A-fib:  Patient's heart rate has been in the 40s and 50s. We'll decrease metoprolol to 12-1/2 mg twice a day. Nurse held today. not a candidate for anticoagulation due to fall risk age etc.   Trigeminal neuralgia   COPD (chronic obstructive pulmonary disease) stable   Bradycardia   Code Status:  DO NOT RESUSCITATE  Family Communication:   multiple at bedside  Disposition Plan:  Home tomorrow if stable.   Consultants:    Procedures:     Antibiotics:    HPI/Subjective: Wants to go home. Denies dyspnea. No leg swelling. No other complaints.   Objective: Filed Vitals:   06/18/14 0932  BP: 115/40  Pulse: 46  Temp:   Resp:     Intake/Output Summary (Last 24 hours) at 06/18/14 1153 Last data filed at 06/18/14 0609  Gross per 24 hour  Intake      0 ml  Output   1025 ml  Net  -1025 ml   Filed Weights   06/17/14 1904 06/18/14 0604  Weight: 54.159 kg (119 lb 6.4 oz) 53.116 kg (117 lb 1.6 oz)    Exam:   General:   elderly. Alert oriented breathing nonlabored.   Cardiovascular:  regular rate rhythm without murmurs gallops rubs   Respiratory: clear to auscultation bilaterally without wheezes rhonchi or rales   Abdomen:  soft nontender nondistended   Ext:  no clubbing  cyanosis or edema   Basic Metabolic Panel:  Recent Labs Lab 06/17/14 1351 06/18/14 0113  NA 144 141  K 3.9 3.6  CL 108 103  CO2 27 28  GLUCOSE 104* 122*  BUN 28* 26*  CREATININE 0.79 0.82  CALCIUM 9.1 8.7*  MG  --  2.0   Liver Function Tests:  Recent Labs Lab 06/17/14 1351  AST 15  ALT 13*  ALKPHOS 94  BILITOT 0.8  PROT 7.3  ALBUMIN 3.8   No results for input(s): LIPASE, AMYLASE in the last 168 hours. No results for input(s): AMMONIA in the last 168 hours. CBC:  Recent Labs Lab 06/17/14 1351  WBC 7.6  HGB 11.0*  HCT 35.3*  MCV 100.9*  PLT 152   Cardiac Enzymes:  Recent Labs Lab 06/17/14 2000 06/18/14 0113  TROPONINI <0.03 <0.03   BNP (last 3 results)  Recent Labs  06/17/14 1351  BNP 958.8*    ProBNP (last 3 results) No results for input(s): PROBNP in the last 8760 hours.  CBG: No results for input(s): GLUCAP in the last 168 hours.  No results found for this or any previous visit (from the past 240 hour(s)).   Studies: Dg Chest 2 View  06/17/2014   CLINICAL DATA:  Shortness of breath.  Productive cough.  EXAM: CHEST  2 VIEW  COMPARISON:  Chest x-rays dated 06/17/2014 and 05/18/2011  FINDINGS: There is chronic cardiomegaly. COPD. Small to moderate right pleural effusion layered  posteriorly on the lateral view. Some fluid extends along the right minor fissure. No consolidative infiltrates. Chronic peribronchial thickening. Old compression fractures in the mid thoracic spine. Osteopenia. Calcification of the thoracic aorta.  IMPRESSION: Small to moderate right pleural effusion.  COPD.   Electronically Signed   By: Francene Boyers M.D.   On: 06/17/2014 15:47    Scheduled Meds: . ALPRAZolam  0.25 mg Oral QHS  . atorvastatin  20 mg Oral q1800  . clopidogrel  75 mg Oral Daily  . escitalopram  10 mg Oral QHS  . finasteride  5 mg Oral Daily  . furosemide  40 mg Intravenous BID  . heparin subcutaneous  5,000 Units Subcutaneous 3 times per day  .  lisinopril  2.5 mg Oral Daily  . metoprolol tartrate  12.5 mg Oral BID  . pantoprazole  40 mg Oral Daily  . potassium chloride  10 mEq Oral BID  . tamsulosin  0.4 mg Oral Daily  . vitamin B-12  100 mcg Oral Daily   Continuous Infusions:   Time spent:25 minutes  Tamzin Bertling L  [facility] Pager [phone]. If 7PM-7AM, please contact night-coverage at [email or web address], password [facility] 06/18/2014, 11:53 AM  LOS: 1 day

## 2014-06-18 NOTE — Care Management Note (Signed)
Case Management Note  Patient Details  Name: Malachi ParadiseWilliam E Snowberger MRN: [id number] Date of Birth: 17-Jan-1917  Subjective/Objective: 81 y/o m admitted w/CHF.From home.PT cons n place.Await recommendations.                   Action/Plan:d/c plan home.   Expected Discharge Date:   (unknown)               Expected Discharge Plan:  Home w Home Health Services  In-House Referral:     Discharge planning Services  CM Consult  Post Acute Care Choice:    Choice offered to:     DME Arranged:    DME Agency:     HH Arranged:    HH Agency:     Status of Service:  In process, will continue to follow  Medicare Important Message Given:    Date Medicare IM Given:    Medicare IM give by:    Date Additional Medicare IM Given:    Additional Medicare Important Message give by:     If discussed at Long Length of Stay Meetings, dates discussed:    Additional Comments:  Lanier ClamMahabir, Lynx Goodrich, RN 06/18/2014, 3:58 PM

## 2014-06-18 NOTE — Progress Notes (Signed)
NP paged due to HR below 60 with nitro scheduled. NP advise RN to administer nitro as scheduled.

## 2014-06-19 ENCOUNTER — Inpatient Hospital Stay (HOSPITAL_COMMUNITY): Payer: Medicare Other

## 2014-06-19 DIAGNOSIS — G500 Trigeminal neuralgia: Secondary | ICD-10-CM

## 2014-06-19 DIAGNOSIS — J690 Pneumonitis due to inhalation of food and vomit: Secondary | ICD-10-CM

## 2014-06-19 LAB — BRAIN NATRIURETIC PEPTIDE: B Natriuretic Peptide: 657 pg/mL — ABNORMAL HIGH (ref 0.0–100.0)

## 2014-06-19 LAB — BASIC METABOLIC PANEL
ANION GAP: 12 (ref 5–15)
BUN: 37 mg/dL — ABNORMAL HIGH (ref 6–20)
CALCIUM: 8.6 mg/dL — AB (ref 8.9–10.3)
CO2: 28 mmol/L (ref 22–32)
CREATININE: 1.18 mg/dL (ref 0.61–1.24)
Chloride: 102 mmol/L (ref 101–111)
GFR calc Af Amer: 59 mL/min — ABNORMAL LOW (ref >60)
GFR calc non Af Amer: 51 mL/min — ABNORMAL LOW (ref >60)
Glucose, Bld: 120 mg/dL — ABNORMAL HIGH (ref 65–99)
Potassium: 3.4 mmol/L — ABNORMAL LOW (ref 3.5–5.1)
Sodium: 142 mmol/L (ref 135–145)

## 2014-06-19 MED ORDER — FUROSEMIDE 40 MG PO TABS
40.0000 mg | ORAL_TABLET | Freq: Two times a day (BID) | ORAL | Status: DC
  Administered 2014-06-19 – 2014-06-21 (×4): 40 mg via ORAL
  Filled 2014-06-19 (×4): qty 1

## 2014-06-19 MED ORDER — AMOXICILLIN-POT CLAVULANATE 875-125 MG PO TABS
1.0000 | ORAL_TABLET | Freq: Two times a day (BID) | ORAL | Status: DC
  Administered 2014-06-19 – 2014-06-20 (×3): 1 via ORAL
  Filled 2014-06-19 (×3): qty 1

## 2014-06-19 NOTE — Progress Notes (Signed)
[facility] PROGRESS NOTE  TYLAN KINN WJX:[id number] DOB: [birth year] DOA: 06/17/2014 PCP:  Duane Lope, MD  Summary 81 year old white male with history of chronic diastolic heart failure admitted with acute on chronic heart failure.  Assessment/Plan:    Acute on chronic diastolic CHF (congestive heart failure), :  -change lasix from IV to PO and monitor -echo similar to previous  ?aspiration pna- RML PNA SLP eval- know zinkers diverticulum Add abx Monitor    Essential hypertension   Coronary atherosclerosis   GERD   A-fib:  Patient's heart rate has been in the 40s and 50s. We'll decrease metoprolol to 12-1/2 mg twice a day. Nurse held today. not a candidate for anticoagulation due to fall risk age etc.   Trigeminal neuralgia   COPD (chronic obstructive pulmonary disease) stable   Bradycardia   Code Status:  DO NOT RESUSCITATE  Family Communication:   multiple at bedside  Disposition Plan:  Home 1-2 days  Consultants:    Procedures:     Antibiotics:    HPI/Subjective: Has [birth year]- wants to be home  Objective: Filed Vitals:   06/19/14 0624  BP: 133/57  Pulse: 59  Temp: 97.5 F (36.4 C)  Resp: 20    Intake/Output Summary (Last 24 hours) at 06/19/14 1338 Last data filed at 06/19/14 1044  Gross per 24 hour  Intake    240 ml  Output   1725 ml  Net  -1485 ml   Filed Weights   06/17/14 1904 06/18/14 0604 06/19/14 0624  Weight: 54.159 kg (119 lb 6.4 oz) 53.116 kg (117 lb 1.6 oz) 53.1 kg (117 lb 1 oz)    Exam:   General:   elderly. Alert oriented breathing nonlabored.   Cardiovascular:  regular rate rhythm without murmurs gallops rubs   Respiratory: clear to auscultation bilaterally without wheezes rhonchi or rales   Abdomen:  soft nontender nondistended   Ext:  no clubbing cyanosis or edema   Basic Metabolic Panel:  Recent Labs Lab 06/17/14 1351 06/18/14 0113 06/19/14 0405  NA 144 141 142  K 3.9 3.6 3.4*  CL 108  103 102  CO2 GLUCOSE 104* 122* 120*  BUN 28* 26* 37*  CREATININE 0.79 0.82 1.18  CALCIUM 9.1 8.7* 8.6*  MG  --  2.0  --    Liver Function Tests:  Recent Labs Lab 06/17/14 1351  AST 15  ALT 13*  ALKPHOS 94  BILITOT 0.8  PROT 7.3  ALBUMIN 3.8   No results for input(s): LIPASE, AMYLASE in the last 168 hours. No results for input(s): AMMONIA in the last 168 hours. CBC:  Recent Labs Lab 06/17/14 1351  WBC 7.6  HGB 11.0*  HCT 35.3*  MCV 100.9*  PLT 152   Cardiac Enzymes:  Recent Labs Lab 06/17/14 2000 06/18/14 0113  TROPONINI <0.03 <0.03   BNP (last 3 results)  Recent Labs  06/17/14 1351 06/19/14 0405  BNP 958.8* 657.0*    ProBNP (last 3 results) No results for input(s): PROBNP in the last 8760 hours.  CBG: No results for input(s): GLUCAP in the last 168 hours.  No results found for this or any previous visit (from the past 240 hour(s)).   Studies: Dg Chest 2 View  06/17/2014   CLINICAL DATA:  Shortness of breath.  Productive cough.  EXAM: CHEST  2 VIEW  COMPARISON:  Chest x-rays dated 06/17/2014 and 05/18/2011  FINDINGS: There is chronic cardiomegaly. COPD. Small to moderate right pleural effusion  layered posteriorly on the lateral view. Some fluid extends along the right minor fissure. No consolidative infiltrates. Chronic peribronchial thickening. Old compression fractures in the mid thoracic spine. Osteopenia. Calcification of the thoracic aorta.  IMPRESSION: Small to moderate right pleural effusion.  COPD.   Electronically Signed   By: Francene BoyersJames  Maxwell M.D.   On: 06/17/2014 15:47   Dg Chest Port 1 View  06/19/2014   CLINICAL DATA:  Congestive heart failure.  EXAM: PORTABLE CHEST - 1 VIEW  COMPARISON:  06/17/2014.  FINDINGS: Mediastinum hilar structures are normal. Cardiac monitor noted. Cardiomegaly. Diffuse bilateral pulmonary interstitial prominence with alveolar infiltrate right mid lung. Findings suggest congestive heart failure with possible  right mid lung pneumonia . Small right pleural effusion. No pneumothorax.  IMPRESSION: 1. Cardiomegaly diffuse pulmonary interstitial prominence consistent with congestive heart failure. Small right pleural effusion. 2. Alveolar infiltrate right mid lung. This could represent focal alveolar edema and/or pneumonia .   Electronically Signed   By: Maisie Fushomas  Register   On: 06/19/2014 07:07    Scheduled Meds: . ALPRAZolam  0.25 mg Oral QHS  . amoxicillin-clavulanate  1 tablet Oral BID  . atorvastatin  20 mg Oral q1800  . clopidogrel  75 mg Oral Daily  . escitalopram  10 mg Oral QHS  . finasteride  5 mg Oral Daily  . furosemide  40 mg Oral BID  . heparin subcutaneous  5,000 Units Subcutaneous 3 times per day  . lisinopril  2.5 mg Oral Daily  . metoprolol tartrate  12.5 mg Oral BID  . pantoprazole  40 mg Oral Daily  . potassium chloride  10 mEq Oral BID  . tamsulosin  0.4 mg Oral Daily  . vitamin B-12  100 mcg Oral Daily   Continuous Infusions:   Time spent:25 minutes  Benjamine MolaVANN, JESSICA  [facility] Pager [phone]. If 7PM-7AM, please contact night-coverage at [email or web address], password [email or web address] 06/19/2014, 1:38 PM  LOS: 2 days

## 2014-06-19 NOTE — Evaluation (Signed)
Physical Therapy Evaluation Patient Details Name: Nathan Mills MRN: [id number] DOB: Jul 23, 1917 Today's Date: 06/19/2014   History of Present Illness  81 yo male admitted with CHF. Hx of syncope, TIA, HTN, A fib, CVA, COPD, CHF  Clinical Impression  On eval, pt was Min guard assist for mobility-able to ambulate ~150 feet with RW. Noted pt to intermittently bump into objects positioned on L side while walking, especially in small, tight spaces. Pt tolerated activity fairly well. Recommend HHPT, 24 hour supervision/assist, home health aide.     Follow Up Recommendations Home health PT;Supervision/Assistance - 24 hour (home health aide)    Equipment Recommendations  None recommended by PT    Recommendations for Other Services       Precautions / Restrictions Precautions Precautions: Fall Restrictions Weight Bearing Restrictions: No      Mobility  Bed Mobility Overal bed mobility: Needs Assistance             General bed mobility comments: supervision. increased time   Transfers Overall transfer level: Needs assistance   Transfers: Sit to/from Stand Sit to Stand: Min guard         General transfer comment: close guard for safety. VCs hand placement. Pt attempts to sit before safely positioned in front of surface  Ambulation/Gait Ambulation/Gait assistance: Min guard Ambulation Distance (Feet): 150 Feet Assistive device: Rolling walker (2 wheeled) Gait Pattern/deviations: Step-through pattern;Decreased stride length;Trunk flexed     General Gait Details: close guard for safety. VCs safety, distance from RW, posture. slow gait speed.   Stairs            Wheelchair Mobility    Modified Rankin (Stroke Patients Only)       Balance Overall balance assessment: History of Falls;Needs assistance         Standing balance support: Bilateral upper extremity supported;During functional activity Standing balance-Leahy Scale: Poor Standing balance  comment: needs RW for support                             Pertinent Vitals/Pain Pain Assessment: No/denies pain    Home Living Family/patient expects to be discharged to:: Private residence Living Arrangements: Spouse/significant other Available Help at Discharge: Family;Personal care attendant Type of Home:  (condo) Home Access: Level entry     Home Layout: One level Home Equipment: Walker - 2 wheels;Cane - single point;Wheelchair - manual      Prior Function Level of Independence: Needs assistance   Gait / Transfers Assistance Needed: uses RW. has history of falls.   ADL's / Homemaking Assistance Needed: pt reports aide comes out 5x/week  Comments: wife uses wheelchair mostly per pt     Hand Dominance        Extremity/Trunk Assessment   Upper Extremity Assessment: Generalized weakness           Lower Extremity Assessment: Generalized weakness      Cervical / Trunk Assessment: Kyphotic  Communication   Communication: HOH  Cognition Arousal/Alertness: Awake/alert Behavior During Therapy: WFL for tasks assessed/performed Overall Cognitive Status: Within Functional Limits for tasks assessed                      General Comments      Exercises        Assessment/Plan    PT Assessment Patient needs continued PT services  PT Diagnosis Difficulty walking;Generalized weakness   PT Problem List Decreased strength;Decreased activity tolerance;Decreased balance;Decreased mobility;Decreased  knowledge of use of DME  PT Treatment Interventions DME instruction;Gait training;Functional mobility training;Therapeutic activities;Therapeutic exercise;Patient/family education;Balance training   PT Goals (Current goals can be found in the Care Plan section) Acute Rehab PT Goals Patient Stated Goal: home PT Goal Formulation: With patient Time For Goal Achievement: 07/03/14 Potential to Achieve Goals: Fair    Frequency Min 3X/week   Barriers to  discharge Decreased caregiver support wife unable to physically assist pt    Co-evaluation               End of Session Equipment Utilized During Treatment: Gait belt Activity Tolerance: Patient tolerated treatment well Patient left: in chair;with call bell/phone within reach           Time: 1036-1104 PT Time Calculation (min) (ACUTE ONLY): 28 min   Charges:   PT Evaluation $Initial PT Evaluation Tier I: 1 Procedure PT Treatments $Gait Training: 8-22 mins   PT G Codes:        Rebeca Alert, MPT Pager: [phone]

## 2014-06-19 NOTE — Progress Notes (Signed)
Initial Nutrition Assessment  DOCUMENTATION CODES:  Underweight  INTERVENTION: - Will order Magic Cup BID - RD will continue to monitor for needs  NUTRITION DIAGNOSIS:  Swallowing difficulty related to dysphagia as evidenced by per patient/family report.  GOAL:  Patient will meet greater than or equal to 90% of their needs  MONITOR:  PO intake, Supplement acceptance, Weight trends, Labs, I & O's  REASON FOR ASSESSMENT:  Other (Comment) (Underweight BMI)  ASSESSMENT: 81 y.o. male, with history of chronic atrial fibrillation [place] Vasc score of 3 not on anticoagulation, on in diastolic CHF last EF of 55% few years ago, history of loop recorder placement in the past, hiatal hernia, GERD, CAD. In the ER workup confirmed acute on chronic diastolic CHF.   Pt seen for underweight BMI. Unable to state if pt meets criteria for malnutrition at this time as physical assessment was not done. Pt's family reports most of information as pt is HOH and speech is somewhat difficult to understand.   At home pt drinks Ensure mixed with ice cream and family feels that pt would enjoy Magic Cup here but they decline offer for Ensure order. Pt has a good appetite overall. They state that he has difficulty swallowing and that he has had 2 prior surgeries for this issue. Pt indicates that it feels as though food is getting stuck and it causes him to cough.  No intakes documented. Unable to assess if pt is fully meeting needs at this time. Medications reviewed. Labs reviewed; K: 3.4 mmol/L, BUN elevated, GFR: 51.  Height:  Ht Readings from Last 1 Encounters:  06/17/14 5\' 8"  (1.727 m)    Weight:  Wt Readings from Last 1 Encounters:  06/19/14 117 lb 1 oz (53.1 kg)    Ideal Body Weight:  70 kg (kg)  Wt Readings from Last 10 Encounters:  06/19/14 117 lb 1 oz (53.1 kg)  05/20/14 115 lb (52.164 kg)  04/24/14 120 lb (54.432 kg)  02/20/14 119 lb 9.6 oz (54.25 kg)  11/29/13 113 lb 4.8 oz (51.393 kg)   11/15/13 116 lb 8 oz (52.844 kg)  08/03/13 115 lb 11.2 oz (52.481 kg)  07/17/13 112 lb 12.8 oz (51.166 kg)  03/28/13 117 lb 9.6 oz (53.343 kg)  03/22/13 114 lb 8 oz (51.937 kg)    BMI:  Body mass index is 17.8 kg/(m^2).  Estimated Nutritional Needs:  Kcal:  1300-1500  Protein:  60-70 grams  Fluid:  2 L/day  Skin:  Reviewed, no issues  Diet Order:     EDUCATION NEEDS:  No education needs identified at this time   Intake/Output Summary (Last 24 hours) at 06/19/14 1417 Last data filed at 06/19/14 1044  Gross per 24 hour  Intake    240 ml  Output   1725 ml  Net  -1485 ml    Last BM:  6/7   Trenton GammonJessica Raydin Bielinski, RD, LDN Inpatient Clinical Dietitian Pager # [phone] After hours/weekend pager # [phone]

## 2014-06-19 NOTE — Care Management Note (Signed)
Case Management Note  Patient Details  Name: Nathan Mills MRN: [id number] Date of Birth: [birth year]  Subjective/Objective: 81 y/o m admitted w/CHF. From home w/private duty care services-6h a dy/5d/wk.Active w/[facility]PT-[facility].                  Action/Plan: d/c plan home.   Expected Discharge Date:   (unknown)               Expected Discharge Plan:  Home w Home Health Services ([facility]PT-[facility])  In-House Referral:     Discharge planning Services  CM Consult  Post Acute Care Choice:  Home Health (Active w/[facility].Spoke to [place] tel#[phone],will fax orders to [phone] ) Choice offered to:     DME Arranged:    DME Agency:     HH Arranged:    HH Agency:     Status of Service:  In process, will continue to follow  Medicare Important Message Given:    Date Medicare IM Given:    Medicare IM give by:    Date Additional Medicare IM Given:    Additional Medicare Important Message give by:     If discussed at Long Length of Stay Meetings, dates discussed:    Additional Comments:  Lanier ClamMahabir, Nathan Ratti, RN 06/19/2014, 1:05 PM

## 2014-06-20 LAB — CBC
HEMATOCRIT: 31.3 % — AB (ref 39.0–52.0)
Hemoglobin: 10.3 g/dL — ABNORMAL LOW (ref 13.0–17.0)
MCH: 32.3 pg (ref 26.0–34.0)
MCHC: 32.9 g/dL (ref 30.0–36.0)
MCV: 98.1 fL (ref 78.0–100.0)
Platelets: 161 10*3/uL (ref 150–400)
RBC: 3.19 MIL/uL — AB (ref 4.22–5.81)
RDW: 13.4 % (ref 11.5–15.5)
WBC: 7.2 10*3/uL (ref 4.0–10.5)

## 2014-06-20 LAB — BASIC METABOLIC PANEL
ANION GAP: 11 (ref 5–15)
BUN: 38 mg/dL — AB (ref 6–20)
CHLORIDE: 104 mmol/L (ref 101–111)
CO2: 29 mmol/L (ref 22–32)
CREATININE: 1.15 mg/dL (ref 0.61–1.24)
Calcium: 8.5 mg/dL — ABNORMAL LOW (ref 8.9–10.3)
GFR calc Af Amer: >60 mL/min (ref >60)
GFR calc non Af Amer: 53 mL/min — ABNORMAL LOW (ref >60)
Glucose, Bld: 128 mg/dL — ABNORMAL HIGH (ref 65–99)
POTASSIUM: 3.3 mmol/L — AB (ref 3.5–5.1)
Sodium: 144 mmol/L (ref 135–145)

## 2014-06-20 MED ORDER — POTASSIUM CHLORIDE CRYS ER 20 MEQ PO TBCR
20.0000 meq | EXTENDED_RELEASE_TABLET | Freq: Two times a day (BID) | ORAL | Status: DC
  Administered 2014-06-20: 20 meq via ORAL
  Filled 2014-06-20: qty 1

## 2014-06-20 MED ORDER — AMOXICILLIN-POT CLAVULANATE 500-125 MG PO TABS
1.0000 | ORAL_TABLET | Freq: Two times a day (BID) | ORAL | Status: DC
  Administered 2014-06-20 – 2014-06-21 (×2): 500 mg via ORAL
  Filled 2014-06-20 (×2): qty 1

## 2014-06-20 MED ORDER — GUAIFENESIN ER 600 MG PO TB12
600.0000 mg | ORAL_TABLET | Freq: Two times a day (BID) | ORAL | Status: DC
  Administered 2014-06-20 – 2014-06-21 (×3): 600 mg via ORAL
  Filled 2014-06-20 (×4): qty 1

## 2014-06-20 NOTE — Evaluation (Signed)
Clinical/Bedside Swallow Evaluation Patient Details  Name: Malachi ParadiseWilliam E Purdum MRN: [id number] Date of Birth: [birth year]  Today's Date: 06/20/2014 Time: SLP Start Time (ACUTE ONLY): 1115 SLP Stop Time (ACUTE ONLY): 1158 SLP Time Calculation (min) (ACUTE ONLY): 43 min  Past Medical History:  Past Medical History  Diagnosis Date  . CAD (coronary artery disease)     a. s/p remote MI [id number]  . Syncope   . TIA (transient ischemic attack)   . Zenker's diverticulum   . GERD (gastroesophageal reflux disease)   . Depression   . Nephrolithiasis   . Hyperlipidemia   . HTN (hypertension)   . Atrial fibrillation     a. Dx 04/2011  . Gallstones   . COPD (chronic obstructive pulmonary disease)   . Peptic stricture of esophagus   . CVA (cerebral infarction)   . Gastric polyp   . Bleeding   . Fall against object   . Acute blood loss anemia   . Syncope   . Hiatal hernia   . Trigeminal neuralgia   . Esophageal dysmotility   . Hiatal hernia    Past Surgical History:  Past Surgical History  Procedure Laterality Date  . Laparoscopic cholecystectomy    . Implantation of a medtronic implantation loop recorder  03/01/2006    Dr. Ladona Ridgelaylor  . Esophagogastroduodenoscopy  01/24/2009    Esophageal Stricture--Multiple EGD's   HPI:  81 [facility] with Acute on chronic diastolic CHF (congestive heart failure), trigeminal neuralgia, COPD, bradycardia, Afib.  Pt has chronic dysphagia and known Zenker's diverticulum.  Pt [facility] that was not effective and was given option of "dealing with it" or having a feeding tube per pt - pt decided to continue po intake.   Pt has undergone Ba swallow previously and was diagnosed with mild esophageal dysmotiity, mild amount of reflux, mild oral delay.  Barium tablet lodged in Zenker's then cleared with further liquid swallows.     Assessment / Plan / Recommendation Clinical Impression  SLP assisted pt with dental care/brushing as he was noted to  have retained scrambled eggs retained posteriorally.  Pt with known h/o Zenker's diverticulum s/p unsuccessful treatment per pt.  Dysarthria apparent with decreased lingual/labial ROM.  SLP evaluated pt with family *spouse and 2 daughters* in the room with him.    Reviewed previous esophagram showing silent aspiration and reviewed aspiration mitigation strategies.  Pt with subtle cough thin water x2 of 6 boluses.  He reports coughing nearly every other meal at home.  Advised him and family to pH neutral status of water making it "safest" to aspirate and educated them to foods being more dangerous to aspirate than liquids.   Family reports pt's swallow ability currently is the same as normal.    Further advised pt/family to consider obtaining liquid medication, chewable medications and masticate food very thoroughly.  Pt has previously declined feeding tube and today maintains same choice.   Provided heimlich maneuver in writing for education.    Suspect aspiration contributing factor to pt's pna based on evaluation and pt/family report. Advised family to increased pulmonary ramficiations of aspiration as people age, become less mobile and have mulitple comorbitidies.  Family/pt deny ANY pna until this admit.  SLP to sign off as all education completed.  Thanks for this consult.      Aspiration Risk    Severe and ongoing   Diet Recommendation Age appropriate regular solids;Thin   Medication Administration:  (defer to pt  wishes) Compensations: Slow rate;Small sips/bites (consume water with meals preferably)    Other  Recommendations   n/a  Follow Up Recommendations    n/a   Frequency and Duration   n/a     Pertinent Vitals/Pain Afebrile,decreased      Swallow Study Prior Functional Status   wt loss of 4 pounds in last year, pt reports unintentional and has seen RD for assistance    General Date of Onset: 06/20/14 Other Pertinent Information: 81 yo male adm to [facility] with Acute on chronic  diastolic CHF (congestive heart failure), trigeminal neuralgia, COPD, bradycardia, Afib.  Pt has chronic dysphagia and known Zenker's diverticulum.  Pt is s/p Zenker's tx at [facility] that was not effective and was given option of "dealing with it" or having a feeding tube per pt - pt decided to continue po intake.   Pt has undergone Ba swallow previously and was diagnosed with mild esophageal dysmotiity, mild amount of reflux, mild oral delay.  Barium tablet lodged in Zenker's then cleared with further liquid swallows.   Type of Study: Bedside swallow evaluation Diet Prior to this Study: Regular;Thin liquids Temperature Spikes Noted: No Respiratory Status: Supplemental O2 delivered via (comment) History of Recent Intubation: No Behavior/Cognition: Alert;Cooperative;Pleasant mood Oral Cavity - Dentition: Poor condition Self-Feeding Abilities: Able to feed self Patient Positioning: Upright in bed Baseline Vocal Quality: Low vocal intensity Volitional Cough: Strong Volitional Swallow: Able to elicit    Oral/Motor/Sensory Function Overall Oral Motor/Sensory Function:  (pt dysarthric indicating decreased lingual strength, decreased labial ROM noted also, palatal elevation bilateral)   Ice Chips Ice chips: Not tested   Thin Liquid Thin Liquid: Impaired Presentation: Cup;Self Fed;Straw Oral Phase Impairments: Reduced lingual movement/coordination Pharyngeal  Phase Impairments: Multiple swallows;Cough - Delayed    Nectar Thick Nectar Thick Liquid: Not tested   Honey Thick Honey Thick Liquid: Not tested   Puree Puree: Within functional limits Presentation: Self Fed;Spoon   Solid   GO    Solid: Not tested      Donavan Burnet, MS [facility] SLP [phone]

## 2014-06-20 NOTE — Clinical Documentation Improvement (Addendum)
Past medical history notes dyskinesia of esophagus.  Registered Dietician note 6/7 states "Swallowing difficulty related to dysphagia as evidenced by patient / family report".  Please document in your progress note if dyskinesia of esophagus / dysphagia present this admission.    Current documentation also notes "? Aspiration pna - RML PNA".  Please clarify if, after study, aspiration pneumonia ruled in or out.  Registered Dietician also noted Height 5 feet 8 inches; Weight 117 pounds 1 ounce; BMI 17.8 and stated unable to determine if meets criteria for malnutrition as physical assessment was not done.  Please clarify if malnutrition present and degree, if known, per your physical assessment.    Thank you, Doy MinceVangela Lakeithia Rasor, RN [phone] Clinical Documentation Specialist

## 2014-06-20 NOTE — Care Management Note (Signed)
Case Management Note  Patient Details  Name: Malachi ParadiseWilliam E Hartinger MRN: [id number] Date of Birth: 02-Jun-1917  Subjective/Objective:  [facility] dme rep following to deliver home 02 to rm.[facility] rep Elnita MaxwellCheryl aware of d/c in am, already have HHPT order.                  Action/Plan:d/c home w/HHC,DME-02.No further d/c needs   Expected Discharge Date:   (unknown)               Expected Discharge Plan:  Home w Home Health Services ([facility]PT-[facility])  In-House Referral:     Discharge planning Services  CM Consult  Post Acute Care Choice:  Home Health (Active w/[facility].Spoke to [place] tel#[phone],will fax orders to [phone] ) Choice offered to:  Patient  DME Arranged:  Oxygen DME Agency:  [facility] (Qualified for home 02.[facility] dme rep Lecretia aware of order, & d/c in am.)  HH Arranged:    HH Agency:     Status of Service:  In process, will continue to follow  Medicare Important Message Given:    Date Medicare IM Given:    Medicare IM give by:    Date Additional Medicare IM Given:    Additional Medicare Important Message give by:     If discussed at Long Length of Stay Meetings, dates discussed:    Additional Comments:  Lanier ClamMahabir, Jaymir Struble, RN 06/20/2014, 2:16 PM

## 2014-06-20 NOTE — Progress Notes (Signed)
[facility]  [facility] is providing the following services: Oxygen  If patient discharges after hours, please call [phone].   Nathan Mills 06/20/2014, 5:46 PM

## 2014-06-20 NOTE — Progress Notes (Signed)
SATURATION QUALIFICATIONS: (This note is used to comply with regulatory documentation for home oxygen)  Patient Saturations on Room Air at Rest =96 Patient Saturations on Room Air while Ambulating = 81 Patient Saturations on 2 Liters of oxygen while Ambulating = 92-93% Please briefly explain why patient needs home oxygen: pt desat's with activities

## 2014-06-20 NOTE — Progress Notes (Signed)
[facility] PROGRESS NOTE  Nathan ParadiseWilliam E Mills ZOX:[id number] DOB: 19-Oct-1917 DOA: 06/17/2014 PCP:  Duane Lopeoss, Alan, MD  Summary 81 year old white male with history of chronic diastolic heart failure admitted with acute on chronic heart failure.  Assessment/Plan: Acute resp failure -arrange home O2    Acute on chronic diastolic CHF (congestive heart failure), :  -change lasix from IV to PO and monitor -echo similar to previous  aspiration pna- RML PNA known zinkers diverticulum puts him at risk for aspirating Add abx- augmentin -mucinex    Essential hypertension   Coronary atherosclerosis   GERD   A-fib:  decrease metoprolol to 12-1/2 mg twice a day. Nurse held today. not a candidate for anticoagulation due to fall risk age etc.   Trigeminal neuralgia   COPD (chronic obstructive pulmonary disease) stable   Bradycardia   Code Status:  DO NOT RESUSCITATE  Family Communication:   multiple at bedside  Disposition Plan:  Home in AM  Consultants:    Procedures:     Antibiotics:    HPI/Subjective: Wants to go home Lives with wife and has care giver  Objective: Filed Vitals:   06/20/14 1405  BP: 111/51  Pulse: 76  Temp: 98.1 F (36.7 C)  Resp: 18    Intake/Output Summary (Last 24 hours) at 06/20/14 1408 Last data filed at 06/20/14 1300  Gross per 24 hour  Intake    960 ml  Output    625 ml  Net    335 ml   Filed Weights   06/18/14 0604 06/19/14 0624 06/20/14 0615  Weight: 53.116 kg (117 lb 1.6 oz) 53.1 kg (117 lb 1 oz) 51.2 kg (112 lb 14 oz)    Exam:   General:   elderly. Wet sounding cough  Cardiovascular:  regular rate rhythm without murmurs gallops rubs   Respiratory: cough clears lungs  Abdomen:  soft nontender nondistended   Ext:  no clubbing cyanosis or edema   Basic Metabolic Panel:  Recent Labs Lab 06/17/14 1351 06/18/14 0113 06/19/14 0405 06/20/14 0355  NA 144 141 142 144  K 3.9 3.6 3.4* 3.3*  CL 108 103 102 104  CO2 27 28  28 29   GLUCOSE 104* 122* 120* 128*  BUN 28* 26* 37* 38*  CREATININE 0.79 0.82 1.18 1.15  CALCIUM 9.1 8.7* 8.6* 8.5*  MG  --  2.0  --   --    Liver Function Tests:  Recent Labs Lab 06/17/14 1351  AST 15  ALT 13*  ALKPHOS 94  BILITOT 0.8  PROT 7.3  ALBUMIN 3.8   No results for input(s): LIPASE, AMYLASE in the last 168 hours. No results for input(s): AMMONIA in the last 168 hours. CBC:  Recent Labs Lab 06/17/14 1351 06/20/14 0355  WBC 7.6 7.2  HGB 11.0* 10.3*  HCT 35.3* 31.3*  MCV 100.9* 98.1  PLT 152 161   Cardiac Enzymes:  Recent Labs Lab 06/17/14 2000 06/18/14 0113  TROPONINI <0.03 <0.03   BNP (last 3 results)  Recent Labs  06/17/14 1351 06/19/14 0405  BNP 958.8* 657.0*    ProBNP (last 3 results) No results for input(s): PROBNP in the last 8760 hours.  CBG: No results for input(s): GLUCAP in the last 168 hours.  No results found for this or any previous visit (from the past 240 hour(s)).   Studies: Dg Chest Port 1 View  06/19/2014   CLINICAL DATA:  Congestive heart failure.  EXAM: PORTABLE CHEST - 1 VIEW  COMPARISON:  06/17/2014.  FINDINGS:  Mediastinum hilar structures are normal. Cardiac monitor noted. Cardiomegaly. Diffuse bilateral pulmonary interstitial prominence with alveolar infiltrate right mid lung. Findings suggest congestive heart failure with possible right mid lung pneumonia . Small right pleural effusion. No pneumothorax.  IMPRESSION: 1. Cardiomegaly diffuse pulmonary interstitial prominence consistent with congestive heart failure. Small right pleural effusion. 2. Alveolar infiltrate right mid lung. This could represent focal alveolar edema and/or pneumonia .   Electronically Signed   By: Maisie Fus  Register   On: 06/19/2014 07:07    Scheduled Meds: . ALPRAZolam  0.25 mg Oral QHS  . amoxicillin-clavulanate  1 tablet Oral BID  . atorvastatin  20 mg Oral q1800  . clopidogrel  75 mg Oral Daily  . escitalopram  10 mg Oral QHS  . finasteride   5 mg Oral Daily  . furosemide  40 mg Oral BID  . guaiFENesin  600 mg Oral BID  . heparin subcutaneous  5,000 Units Subcutaneous 3 times per day  . lisinopril  2.5 mg Oral Daily  . metoprolol tartrate  12.5 mg Oral BID  . pantoprazole  40 mg Oral Daily  . potassium chloride  20 mEq Oral BID  . tamsulosin  0.4 mg Oral Daily  . vitamin B-12  100 mcg Oral Daily   Continuous Infusions:   Time spent:25 minutes  Benjamine Mola, Audrielle Vankuren  [facility] Pager [phone]. If 7PM-7AM, please contact night-coverage at [email or web address], password [facility] 06/20/2014, 2:08 PM  LOS: 3 days

## 2014-06-21 MED ORDER — AMOXICILLIN-POT CLAVULANATE 500-125 MG PO TABS: 1.0000 | ORAL_TABLET | Freq: Two times a day (BID) | ORAL | Status: DC

## 2014-06-21 MED ORDER — POTASSIUM CHLORIDE CRYS ER 20 MEQ PO TBCR
40.0000 meq | EXTENDED_RELEASE_TABLET | ORAL | Status: DC
  Administered 2014-06-21: 40 meq via ORAL
  Filled 2014-06-21: qty 2

## 2014-06-21 MED ORDER — FUROSEMIDE 40 MG PO TABS: 40.0000 mg | ORAL_TABLET | Freq: Two times a day (BID) | ORAL | Status: DC

## 2014-06-21 MED ORDER — LISINOPRIL 2.5 MG PO TABS: 2.5000 mg | ORAL_TABLET | Freq: Every day | ORAL | Status: DC

## 2014-06-21 MED ORDER — METOPROLOL TARTRATE 25 MG PO TABS: 12.5000 mg | ORAL_TABLET | Freq: Two times a day (BID) | ORAL | Status: DC

## 2014-06-21 MED ORDER — POTASSIUM CHLORIDE CRYS ER 20 MEQ PO TBCR: 40.0000 meq | EXTENDED_RELEASE_TABLET | Freq: Every day | ORAL | Status: DC

## 2014-06-21 NOTE — Discharge Summary (Addendum)
Nathan Mills, is a 81 y.o. male  DOB April 01, 1917  MRN [id number].  Admission date:  06/17/2014  Admitting Physician  Leroy Sea, MD  Discharge Date:  06/21/2014   Primary MD   Duane Lope, MD  Recommendations for primary care physician for things to follow:   Check CBC, CMP, weight and adjust diuretic dose as needed   Admission Diagnosis  Chronic obstructive pulmonary disease, unspecified COPD, unspecified chronic bronchitis type [J44.9] Acute congestive heart failure, unspecified congestive heart failure type [I50.9]   Discharge Diagnosis  Chronic obstructive pulmonary disease, unspecified COPD, unspecified chronic bronchitis type [J44.9] Acute congestive heart failure, unspecified congestive heart failure type [I50.9]    Principal Problem:   Acute on chronic diastolic CHF (congestive heart failure), NYHA class 2 Active Problems:   Essential hypertension   Coronary atherosclerosis   GERD   A-fib   Trigeminal neuralgia   COPD (chronic obstructive pulmonary disease)   Acute on chronic diastolic CHF (congestive heart failure), NYHA class 1   Bradycardia   Pressure ulcer      Past Medical History  Diagnosis Date  . CAD (coronary artery disease)     a. s/p remote MI 71  . Syncope   . TIA (transient ischemic attack)   . Zenker's diverticulum   . GERD (gastroesophageal reflux disease)   . Depression   . Nephrolithiasis   . Hyperlipidemia   . HTN (hypertension)   . Atrial fibrillation     a. Dx 04/2011  . Gallstones   . COPD (chronic obstructive pulmonary disease)   . Peptic stricture of esophagus   . CVA (cerebral infarction)   . Gastric polyp   . Bleeding   . Fall against object   . Acute blood loss anemia   . Syncope   . Hiatal hernia   . Trigeminal neuralgia   . Esophageal  dysmotility   . Hiatal hernia     Past Surgical History  Procedure Laterality Date  . Laparoscopic cholecystectomy    . Implantation of a medtronic implantation loop recorder  03/01/2006    Dr. Ladona Ridgel  . Esophagogastroduodenoscopy  01/24/2009    Esophageal Stricture--Multiple EGD's       History of present illness and  Hospital Course:     Kindly see H&P for history of present illness and admission details, please review complete Labs, Consult reports and Test reports for all details in brief  HPI  from the history and physical done on the day of admission  Nathan Mills is a 81 y.o. male, with history of chronic atrial fibrillation [place] Vasc score of 3 not on anticoagulation, on in diastolic CHF last EF of 55% few years ago, history of loop recorder placement in the past, hiatal hernia, GERD, CAD, who lives at his home with his wife and a bit with the help of a walker.  Initially went to an outpatient urgent care for shortness of breath and cough, he was diagnosed with CHF and sent to the ER. In the ER  workup confirmed acute on chronic diastolic CHF. I was called to admit the patient.  And currently denies any headache, no fever chills, does have a dry cough and some exertional shortness of breath, mild orthopnea, denies any palpitations or chest pain, no abdominal pain, no blood in stool or urine. No focal weakness.   Hospital Course    1. Acute on chronic diastolic CHF last EF 55%  . Much improved with IV Lasix, fluids RESTRICTION, initially required Nitropaste, now on low-dose beta blocker, low-dose ACE inhibitor and oral Lasix, no oxygen demand, ambulated in the hallway with oxygen of 92% on room air. Seen by cardiology will be discharged on oral Lasix, low-dose beta blocker as heart rate was slightly on the lower side, low-dose ACE inhibitor with outpatient follow-up with cardiology. During his hospital stay there was a question of mild URI and he was placed on Augmentin. We'll  give for 3 more days and stop.  Requesting PCP to monitor BMP, weight and diuretic dose closely   2. CAD . Stable. No acute issues, no chest pain EKG nonacute. Continue on Plavix, beta blocker, statin for secondary prevention.   3.Dyslipidemia - continue Statin.   4.BPH - continue Alfa blocker.   5. B-12 deficiency. Continue home dose B-12 supplementation.   6. Essential hypertension. B blocker adjusted as H rate was little low, low-dose ace added for CHF monitor.   7. Chronic atrial fibrillation. [place] VASC 2 score is 2. Not on anticoagulation due to advanced age and high fall risk and bleeding. On Plavix continue. His beta blocker dose has been adjusted as heart rate was in low 50s on home dose beta blocker. Will follow with cardiology outpatient upon discharge.     Discharge Condition: Stable  Follow UP  Follow-up Information    Follow up with [facility]   Why:  HHPT   Contact information:   [address] [place] [address] [phone]       Follow up with Abelino Derrick, PA-C On 07/18/2014.   Specialties:  Cardiology, Radiology   Why:  3:00 pm with Dr. Ludwig Clarks PA   Contact information:   [address] [place] [address] [phone]       Follow up with  Duane Lope, MD. Schedule an appointment as soon as possible for a visit in 1 week.   Specialty:  Family Medicine   Contact information:   [address] [place] [address] [phone]       Follow up with Corky Crafts., MD. Schedule an appointment as soon as possible for a visit in 1 week.   Specialties:  Cardiology, Radiology, Interventional Cardiology   Contact information:   [address] [place] [address] [phone]        Consults obtained -  Cards  Diet and Activity recommendation: See Discharge Instructions below  Discharge Instructions           Discharge Instructions    Discharge instructions    Complete  by:  As directed   Follow with Primary MD  Duane Lope, MD in 7 days   Get CBC, CMP, 2 view Chest X ray checked  by Primary MD next visit.    Activity: As tolerated with Full fall precautions use walker/cane & assistance as needed   Disposition Home     Diet: Heart Healthy , Check your Weight same time everyday, if you gain over 2 pounds, or you develop in leg  swelling, experience more shortness of breath or chest pain, call your Primary MD immediately. Follow Cardiac Low Salt Diet and 1.5 lit/day fluid restriction.   On your next visit with your primary care physician please Get Medicines reviewed and adjusted.   Please request your Prim.MD to go over all Hospital Tests and Procedure/Radiological results at the follow up, please get all Hospital records sent to your Prim MD by signing hospital release before you go home.   If you experience worsening of your admission symptoms, develop shortness of breath, life threatening emergency, suicidal or homicidal thoughts you must seek medical attention immediately by calling 911 or calling your MD immediately  if symptoms less severe.  You Must read complete instructions/literature along with all the possible adverse reactions/side effects for all the Medicines you take and that have been prescribed to you. Take any new Medicines after you have completely understood and accpet all the possible adverse reactions/side effects.   Do not drive, operating heavy machinery, perform activities at heights, swimming or participation in water activities or provide baby sitting services if your were admitted for syncope or siezures until you have seen by Primary MD or a Neurologist and advised to do so again.  Do not drive when taking Pain medications.    Do not take more than prescribed Pain, Sleep and Anxiety Medications  Special Instructions: If you have smoked or chewed Tobacco  in the last 2 yrs please stop smoking, stop any regular Alcohol  and or  any Recreational drug use.  Wear Seat belts while driving.   Please note  You were cared for by a hospitalist during your hospital stay. If you have any questions about your discharge medications or the care you received while you were in the hospital after you are discharged, you can call the unit and asked to speak with the hospitalist on call if the hospitalist that took care of you is not available. Once you are discharged, your primary care physician will handle any further medical issues. Please note that NO REFILLS for any discharge medications will be authorized once you are discharged, as it is imperative that you return to your primary care physician (or establish a relationship with a primary care physician if you do not have one) for your aftercare needs so that they can reassess your need for medications and monitor your lab values.     Increase activity slowly    Complete by:  As directed            Discharge Medications     Medication List    STOP taking these medications        metoprolol succinate 25 MG 24 hr tablet  Commonly known as:  TOPROL-XL      TAKE these medications        ALPRAZolam 0.25 MG tablet  Commonly known as:  XANAX  Take 0.25 mg by mouth at bedtime.     amLODipine-atorvastatin 5-20 MG per tablet  Commonly known as:  CADUET  TAKE 1 TABLET DAILY     amoxicillin-clavulanate 500-125 MG per tablet  Commonly known as:  AUGMENTIN  Take 1 tablet (500 mg total) by mouth 2 (two) times daily.     BENEFIBER Powd  Take 1 tablespoon by mouth in water or juice once daily     clopidogrel 75 MG tablet  Commonly known as:  PLAVIX  Take 1 tablet (75 mg total) by mouth daily.     escitalopram 10 MG tablet  Commonly known as:  LEXAPRO  Take 1 tablet daily by mouth for anxiety.     finasteride 5 MG tablet  Commonly known as:  PROSCAR  Take 5 mg by mouth daily.     furosemide 40 MG tablet  Commonly known as:  LASIX  Take 1 tablet (40 mg total) by  mouth 2 (two) times daily.     GARLIC PO  Take 1 tablet by mouth daily.     Ibuprofen-Diphenhydramine Cit 200-38 MG Tabs  Take 1 tablet by mouth at bedtime.     lisinopril 2.5 MG tablet  Commonly known as:  PRINIVIL,ZESTRIL  Take 1 tablet (2.5 mg total) by mouth daily.     loperamide 2 MG tablet  Commonly known as:  IMODIUM A-D  Take 1 tablet (2 mg total) by mouth as needed for diarrhea or loose stools.     metoprolol tartrate 25 MG tablet  Commonly known as:  LOPRESSOR  Take 0.5 tablets (12.5 mg total) by mouth 2 (two) times daily.     omeprazole 10 MG capsule  Commonly known as:  PRILOSEC  Take 1 capsule by mouth daily.     potassium chloride SA 20 MEQ tablet  Commonly known as:  K-DUR,KLOR-CON  Take 2 tablets (40 mEq total) by mouth daily.     tamsulosin 0.4 MG Caps capsule  Commonly known as:  FLOMAX  Take 1 capsule by mouth daily.     VITAMIN B12 PO  Take 1 tablet by mouth daily.     Vitamin D 2000 UNITS tablet  Take 2,000 Units by mouth daily.        Major procedures and Radiology Reports - PLEASE review detailed and final reports for all details, in brief -     TTE  - Left ventricle: The cavity size was normal. Wall thickness wasincreased in a pattern of severe LVH. Systolic function wasnormal. The estimated ejection fraction was in the range of 55%to 60%. 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 The study is not technicallysufficient to allow evaluation of LV diastolic function. - Aortic valve: Trileaflet. Sclerosis without stenosis. There wasno regurgitation. - Mitral valve: Mildly thickened leaflets . There was mildregurgitation. - Left atrium: Severely dilated at 50 ml/m2. - Right atrium: Severely dilated. - Atrial septum: No defect or patent foramen ovale was identified. - Tricuspid valve: There was mild regurgitation. - Pulmonary arteries: PA peak pressure: 15 mm Hg (S). - Inferior vena cava: The vessel was  normal in size. Therespirophasic diameter changes were in the normal range (>= 50%),consistent with normal central venous pressure.  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 There is severe biatrial enlargement.    Dg Chest 2 View  06/17/2014   CLINICAL DATA:  Shortness of breath.  Productive cough.  EXAM: CHEST  2 VIEW  COMPARISON:  Chest x-rays dated 06/17/2014 and 05/18/2011  FINDINGS: There is chronic cardiomegaly. COPD. Small to moderate right pleural effusion layered posteriorly on the lateral view. Some fluid extends along the right minor fissure. No consolidative infiltrates. Chronic peribronchial thickening. Old compression fractures in the mid thoracic spine. Osteopenia. Calcification of the thoracic aorta.  IMPRESSION: Small to moderate right pleural effusion.  COPD.   Electronically Signed   By: Francene Boyers M.D.   On: 06/17/2014  15:47   Dg Chest Port 1 View  06/19/2014   CLINICAL DATA:  Congestive heart failure.  EXAM: PORTABLE CHEST - 1 VIEW  COMPARISON:  06/17/2014.  FINDINGS: Mediastinum hilar structures are normal. Cardiac monitor noted. Cardiomegaly. Diffuse bilateral pulmonary interstitial prominence with alveolar infiltrate right mid lung. Findings suggest congestive heart failure with possible right mid lung pneumonia . Small right pleural effusion. No pneumothorax.  IMPRESSION: 1. Cardiomegaly diffuse pulmonary interstitial prominence consistent with congestive heart failure. Small right pleural effusion. 2. Alveolar infiltrate right mid lung. This could represent focal alveolar edema and/or pneumonia .   Electronically Signed   By: Maisie Fus  Register   On: 06/19/2014 07:07    Micro Results      No results found for this or any previous visit (from the past 240 hour(s)).     Today   Subjective:    Tori Milks today has no headache,no chest abdominal pain,no new weakness tingling or numbness, feels much better wants to go home today.   Objective:   Blood pressure 128/63, pulse 52, temperature 97.8 F (36.6 C), temperature source Oral, resp. rate 18, height  (1.727 m), weight 55.838 kg (123 lb 1.6 oz), SpO2 95 %.   Intake/Output Summary (Last 24 hours) at 06/21/14 1206 Last data filed at 06/21/14 0831  Gross per 24 hour  Intake    720 ml  Output   1200 ml  Net   -480 ml    Exam Awake Alert, Oriented x 3, No new F.N deficits, Normal affect [place].AT,PERRAL Supple Neck,No JVD, No cervical lymphadenopathy appriciated.  Symmetrical Chest wall movement, Good air movement bilaterally, CTAB RRR,No Gallops,Rubs or new Murmurs, No Parasternal Heave +ve B.Sounds, Abd Soft, Non tender, No organomegaly appriciated, No rebound -guarding or rigidity. No Cyanosis, Clubbing or edema, No new Rash or bruise  Data Review   CBC w Diff:  Lab Results  Component Value Date   WBC 7.2 06/20/2014   HGB 10.3* 06/20/2014   HCT 31.3* 06/20/2014   PLT 161 06/20/2014   LYMPHOPCT 24.1 11/15/2013   MONOPCT 6.0 11/15/2013   EOSPCT 3.4 11/15/2013   BASOPCT 0.3 11/15/2013    CMP:  Lab Results  Component Value Date   NA 144 06/20/2014   K 3.3* 06/20/2014   CL 104 06/20/2014   CO2 29 06/20/2014   BUN 38* 06/20/2014   CREATININE 1.15 06/20/2014   CREATININE 1.03 03/16/2014   PROT 7.3 06/17/2014   ALBUMIN 3.8 06/17/2014   BILITOT 0.8 06/17/2014   ALKPHOS 94 06/17/2014   AST 15 06/17/2014   ALT 13* 06/17/2014  .   Total Time in preparing paper work, data evaluation and todays exam - 35 minutes  Leroy Sea M.D on 06/21/2014 at 12:06 PM  [facility]   Office  [phone]

## 2014-06-21 NOTE — Discharge Instructions (Signed)
Follow with Primary MD  Duane Lope, MD in 7 days   Get CBC, CMP, 2 view Chest X ray checked  by Primary MD next visit.    Activity: As tolerated with Full fall precautions use walker/cane & assistance as needed   Disposition HHPT   Diet: Heart Healthy   For Heart failure patients - Check your Weight same time everyday, if you gain over 2 pounds, or you develop in leg swelling, experience more shortness of breath or chest pain, call your Primary MD immediately. Follow Cardiac Low Salt Diet and 1.5 lit/day fluid restriction.   On your next visit with your primary care physician please Get Medicines reviewed and adjusted.   Please request your Prim.MD to go over all Hospital Tests and Procedure/Radiological results at the follow up, please get all Hospital records sent to your Prim MD by signing hospital release before you go home.   If you experience worsening of your admission symptoms, develop shortness of breath, life threatening emergency, suicidal or homicidal thoughts you must seek medical attention immediately by calling 911 or calling your MD immediately  if symptoms less severe.  You Must read complete instructions/literature along with all the possible adverse reactions/side effects for all the Medicines you take and that have been prescribed to you. Take any new Medicines after you have completely understood and accpet all the possible adverse reactions/side effects.   Do not drive, operating heavy machinery, perform activities at heights, swimming or participation in water activities or provide baby sitting services if your were admitted for syncope or siezures until you have seen by Primary MD or a Neurologist and advised to do so again.  Do not drive when taking Pain medications.    Do not take more than prescribed Pain, Sleep and Anxiety Medications  Special Instructions: If you have smoked or chewed Tobacco  in the last 2 yrs please stop smoking, stop any regular  Alcohol  and or any Recreational drug use.  Wear Seat belts while driving.   Please note  You were cared for by a hospitalist during your hospital stay. If you have any questions about your discharge medications or the care you received while you were in the hospital after you are discharged, you can call the unit and asked to speak with the hospitalist on call if the hospitalist that took care of you is not available. Once you are discharged, your primary care physician will handle any further medical issues. Please note that NO REFILLS for any discharge medications will be authorized once you are discharged, as it is imperative that you return to your primary care physician (or establish a relationship with a primary care physician if you do not have one) for your aftercare needs so that they can reassess your need for medications and monitor your lab values.

## 2014-06-21 NOTE — Care Management Note (Signed)
Case Management Note  Patient Details  Name: Nathan Mills MRN: [id number] Date of Birth: Sep 11, 1917  Subjective/Objective: Per nsg No home 02 needed.[facility] dme rep aware to pick up home 02 tank.                   Action/Plan:d/c home w/[facility]PT-[facility] notified.   Expected Discharge Date:   (unknown)               Expected Discharge Plan:  Home w Home Health Services ([facility])  In-House Referral:     Discharge planning Services  CM Consult  Post Acute Care Choice:  Home Health (Active w/[facility].Spoke to [place] tel#[phone],will fax orders to [phone] ) Choice offered to:  Patient  DME Arranged:   (Patient ODES NOT need 02.[facility] DME rep notified to pick up home 02 tank.) DME Agency:   (Qualified for home 02.[facility] dme rep Mayra Reel aware of order, & d/c in am.)  HH Arranged:  PT HH Agency:  [facility]  Status of Service:  Completed, signed off  Medicare Important Message Given:  Yes Date Medicare IM Given:  06/21/14 Medicare IM give by:  Lanier Clam Date Additional Medicare IM Given:    Additional Medicare Important Message give by:     If discussed at Long Length of Stay Meetings, dates discussed:    Additional Comments:  Lanier Clam, RN 06/21/2014, 11:42 AM

## 2014-06-21 NOTE — Consult Note (Signed)
Patient ID: Nathan Mills MRN: [id number], DOB/AGE: 04/01/1917   Admit date: 06/17/2014   Primary Physician:  Duane Lope, MD Primary Cardiologist: [name]  Pt. Profile:  81 y/o male with chronic afib not on anticoagulation, CAD with remote MI in 1982, chronic diastolic HF, mitral regurgitations and cerebrovascular disease, admitted for acute on chronic diastolic CHF.   Problem List  Past Medical History  Diagnosis Date  . CAD (coronary artery disease)     a. s/p remote MI 53  . Syncope   . TIA (transient ischemic attack)   . Zenker's diverticulum   . GERD (gastroesophageal reflux disease)   . Depression   . Nephrolithiasis   . Hyperlipidemia   . HTN (hypertension)   . Atrial fibrillation     a. Dx 04/2011  . Gallstones   . COPD (chronic obstructive pulmonary disease)   . Peptic stricture of esophagus   . CVA (cerebral infarction)   . Gastric polyp   . Bleeding   . Fall against object   . Acute blood loss anemia   . Syncope   . Hiatal hernia   . Trigeminal neuralgia   . Esophageal dysmotility   . Hiatal hernia     Past Surgical History  Procedure Laterality Date  . Laparoscopic cholecystectomy    . Implantation of a medtronic implantation loop recorder  03/01/2006    Dr. Ladona Ridgel  . Esophagogastroduodenoscopy  01/24/2009    Esophageal Stricture--Multiple EGD's     Allergies  No Known Allergies  HPI  The patient is a 81 y/o male followed by [name] with a PHM significant for permanent atrial fibrillation, CAD, cerebrovascular disease, HTN and HLD. The patient has a history of remote myocardial infarction in 1982, remote TIA and bilateral carotid stenosis. Carotid dopplers in 2014 showed 40-59% stenosis bilaterally. Echocardiogram in 2013 demonstrated normal LVF with EF of 55% and moderate to severe MR and moderate biatrial enlargement. He is not on oral anticoagulation for his atrial fibrillation due to high fall risk and advanced age. His last OV  with [name] was 02/2014. He was felt to be stable from a cardiac standpoint. It was mentioned that the patient opted to forgo any further evaluation by yearly carotid dopplers and echocardiograms, due to his advanced age and poor candidacy for any surgical intervention.   He initially presented to an urgent care on 06/17/14 for cough and shortness of breath. He was diagnosed with CHF and was sent to the [facility] ER. W/u in the ER confirmed acute on chronic diastolic CHF. BNP was 958. CXR showed cardiomegaly and diffuse pulmonary interstitial prominence consistent with congestive heart failure as well as a small right pleural effusion. He was admitted by internal medicine. He was admitted to a telemetry bed, placed on salt and fluid restriction and diuresed with IV Lasix. 2D echo was performed which demonstrated preserved LV systolic function with EF of 55-60%. There was also an aneurysm of the basal inferior wall which is similar in size tothe prior study - the neck measures about 3.5 cm in the 2 chamber view. There is severe biatrial enlargement.  Overall, there is no significant change compared to echo in 2013. The patient was continued on BB therapy. He was noted to have mild bradycardia on telemetry with occasional drops in the mid 40s-50s. He had a good diuretic response with IV Lasix. I/Os net negative 2.7L. Cardiology has been consulted for further recs regarding his diastolic HF prior to discharge.  The patient reports that he feels well today and back to his baseline. No further dyspnea. Denies CP.   Home Medications  Prior to Admission medications   Medication Sig Start Date End Date Taking? Authorizing Provider  ALPRAZolam (XANAX) 0.25 MG tablet Take 0.25 mg by mouth at bedtime.    Yes Historical Provider, MD  amLODipine-atorvastatin (CADUET) 5-20 MG per tablet TAKE 1 TABLET DAILY Patient taking differently: TAKE 1 TABLET BY MOUTH DAILY   Yes Lewayne Bunting, MD  Cholecalciferol (VITAMIN D)  2000 UNITS tablet Take 2,000 Units by mouth daily.   Yes Historical Provider, MD  clopidogrel (PLAVIX) 75 MG tablet Take 1 tablet (75 mg total) by mouth daily. 03/19/13  Yes Adeline Joselyn Glassman, MD  Cyanocobalamin (VITAMIN B12 PO) Take 1 tablet by mouth daily.    Yes Historical Provider, MD  escitalopram (LEXAPRO) 10 MG tablet Take 1 tablet daily by mouth for anxiety. 12/06/13  Yes Mahima Pandey, MD  finasteride (PROSCAR) 5 MG tablet Take 5 mg by mouth daily. 03/11/13  Yes Historical Provider, MD  GARLIC PO Take 1 tablet by mouth daily.   Yes Historical Provider, MD  Ibuprofen-Diphenhydramine Cit 200-38 MG TABS Take 1 tablet by mouth at bedtime.   Yes Historical Provider, MD  metoprolol succinate (TOPROL-XL) 25 MG 24 hr tablet Take 25 mg by mouth daily. 01/03/13  Yes Historical Provider, MD  omeprazole (PRILOSEC) 10 MG capsule Take 1 capsule by mouth daily. 10/08/12  Yes Historical Provider, MD  tamsulosin (FLOMAX) 0.4 MG CAPS capsule Take 1 capsule by mouth daily. 02/05/13  Yes Historical Provider, MD  Wheat Dextrin (BENEFIBER) POWD Take 1 tablespoon by mouth in water or juice once daily 11/15/13  Yes Lori P Hvozdovic, PA-C  loperamide (IMODIUM A-D) 2 MG tablet Take 1 tablet (2 mg total) by mouth as needed for diarrhea or loose stools. 11/15/13   Lori P Hvozdovic, PA-C    Family History  Family History  Problem Relation Age of Onset  . Coronary artery disease    . Cancer Daughter     eye cancer    Social History  History   Social History  . Marital Status: Married    Spouse Name: N/A  . Number of Children: 2  . Years of Education: N/A   Occupational History  . retired     Research officer, political party   Social History Main Topics  . Smoking status: Former Smoker    Quit date: 05/07/1961  . Smokeless tobacco: Never Used  . Alcohol Use: No  . Drug Use: No  . Sexual Activity: Not on file   Other Topics Concern  . Not on file   Social History Narrative     Review of Systems General:  No  chills, fever, night sweats or weight changes.  Cardiovascular:  No chest pain, dyspnea on exertion, edema, orthopnea, palpitations, paroxysmal nocturnal dyspnea. Dermatological: No rash, lesions/masses Respiratory: No cough, dyspnea Urologic: No hematuria, dysuria Abdominal:   No nausea, vomiting, diarrhea, bright red blood per rectum, melena, or hematemesis Neurologic:  No visual changes, wkns, changes in mental status. All other systems reviewed and are otherwise negative except as noted above.  Physical Exam  Blood pressure 128/63, pulse 52, temperature 97.8 F (36.6 C), temperature source Oral, resp. rate 18, height 5\' 8"  (1.727 m), weight 123 lb 1.6 oz (55.838 kg), SpO2 95 %.  General: Pleasant, NAD Psych: Normal affect. Neuro: Alert and oriented X 3. Moves all extremities spontaneously. HEENT: Normal  Neck: Supple  without bruits or JVD. Lungs:  Resp regular and unlabored, CTA. Heart: RRR no s3, s4, or murmurs. Abdomen: Soft, non-tender, non-distended, BS + x 4.  Extremities: No clubbing, cyanosis or edema. DP/PT/Radials 2+ and equal bilaterally.  Labs  Troponin (Point of Care Test) No results for input(s): TROPIPOC in the last 72 hours. No results for input(s): CKTOTAL, CKMB, TROPONINI in the last 72 hours. Lab Results  Component Value Date   WBC 7.2 06/20/2014   HGB 10.3* 06/20/2014   HCT 31.3* 06/20/2014   MCV 98.1 06/20/2014   PLT 161 06/20/2014    Recent Labs Lab 06/17/14 1351  06/20/14 0355  NA 144  < > 144  K 3.9  < > 3.3*  CL 108  < > 104  CO2 27  < > 29  BUN 28*  < > 38*  CREATININE 0.79  < > 1.15  CALCIUM 9.1  < > 8.5*  PROT 7.3  --   --   BILITOT 0.8  --   --   ALKPHOS 94  --   --   ALT 13*  --   --   AST 15  --   --   GLUCOSE 104*  < > 128*  < > = values in this interval not displayed. Lab Results  Component Value Date   CHOL 140 03/16/2014   HDL 44 03/16/2014   LDLCALC 80 03/16/2014   TRIG 81 03/16/2014   No results found for: DDIMER     Radiology/Studies  Dg Chest 2 View  06/17/2014   CLINICAL DATA:  Shortness of breath.  Productive cough.  EXAM: CHEST  2 VIEW  COMPARISON:  Chest x-rays dated 06/17/2014 and 05/18/2011  FINDINGS: There is chronic cardiomegaly. COPD. Small to moderate right pleural effusion layered posteriorly on the lateral view. Some fluid extends along the right minor fissure. No consolidative infiltrates. Chronic peribronchial thickening. Old compression fractures in the mid thoracic spine. Osteopenia. Calcification of the thoracic aorta.  IMPRESSION: Small to moderate right pleural effusion.  COPD.   Electronically Signed   By: Francene Boyers M.D.   On: 06/17/2014 15:47   Dg Chest Port 1 View  06/19/2014   CLINICAL DATA:  Congestive heart failure.  EXAM: PORTABLE CHEST - 1 VIEW  COMPARISON:  06/17/2014.  FINDINGS: Mediastinum hilar structures are normal. Cardiac monitor noted. Cardiomegaly. Diffuse bilateral pulmonary interstitial prominence with alveolar infiltrate right mid lung. Findings suggest congestive heart failure with possible right mid lung pneumonia . Small right pleural effusion. No pneumothorax.  IMPRESSION: 1. Cardiomegaly diffuse pulmonary interstitial prominence consistent with congestive heart failure. Small right pleural effusion. 2. Alveolar infiltrate right mid lung. This could represent focal alveolar edema and/or pneumonia .   Electronically Signed   By: Maisie Fus  Register   On: 06/19/2014 07:07    ECG/Telemetry  Chronic atrial fibrillation, CVR  Echocardiogram 06/18/14  Study Conclusions  - Left ventricle: The cavity size was normal. Wall thickness was increased in a pattern of severe LVH. Systolic function was normal. The estimated ejection fraction was in the range of 55% to 60%. 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 The study is not technically sufficient to allow evaluation of LV diastolic function. - Aortic valve: Trileaflet.  Sclerosis without stenosis. There was no regurgitation. - Mitral valve: Mildly thickened leaflets . There was mild regurgitation. - Left atrium: Severely dilated at 50 ml/m2. - Right atrium: Severely dilated. - Atrial septum: No defect or patent foramen ovale was  identified. - Tricuspid valve: There was mild regurgitation. - Pulmonary arteries: PA peak pressure: 15 mm Hg (S). - Inferior vena cava: The vessel was normal in size. The respirophasic diameter changes were in the normal range (>= 50%), consistent with normal central venous pressure.  Impressions:  - No significant change compared to echo in 2013 - there is an aneurysm of the basal inferior wall which is similar in size to the prior study - the neck measures about 3.5 cm in the 2 chamber view. There is severe biatrial enlargement.    ASSESSMENT AND PLAN  Principal Problem:   Acute on chronic diastolic CHF (congestive heart failure), NYHA class 2 Active Problems:   Essential hypertension   Coronary atherosclerosis   GERD   A-fib   Trigeminal neuralgia   COPD (chronic obstructive pulmonary disease)   Acute on chronic diastolic CHF (congestive heart failure), NYHA class 1   Bradycardia   Pressure ulcer   1. Acute on Chronic Diastolic CHF: appears euvolemic on exam. Breathing comfortably on RA. I/Os net negative 2.7L. He is back on his home Lasix dose and BB. Renal function is normal. We discussed importance of low sodium diet.  Will arrange post hospital in our office.   2. Chronic Atrial Fibrillation: rate controlled on metoprolol. Not a candidate for oral anticoagulation given advanced age and fall risk.   3. Bradycardia: Mild bradycardia noted on telemetry. He was noted to have a brief rate of 42 bpm but mostly averaging in the 50s-60s. Would continue BB for rate control of his afib, especially given his diastolic dysfunction. He is on the lowest dose of metoprolol. No indication for PPM.   4. CAD:  h/o remote MI in 1982. Stable w/o chest pain. Echo shows normal LVF and no new WMA. Continue medical therapy with Plavix, metoprolol, Lipitor and lisinopril.  5. HTN: well controlled on current regimen, BB and ACE-I  6. HLD: continue statin therapy with Lipitor.   7. Hypokalemia: K is 3.3. Supplement prior to discharge.    Signed, Robbie Lis, PA-C 06/21/2014, 9:58 AM   I have examined the patient and reviewed assessment and plan and discussed with patient.  Agree with above as stated.  Agree with above. Mild wheezing. Euvolemic. Replace K. OK to discharge.  Arda Daggs S.

## 2014-06-25 ENCOUNTER — Encounter: Payer: Self-pay | Admitting: *Deleted

## 2014-06-27 NOTE — Progress Notes (Signed)
Cardiology Office Note   Date:  06/28/2014   ID:  Nathan Mills, DOB 14-Oct-1917, MRN [id number]  PCP:   Duane Lope, MD  Cardiologist:  [name]     Chief Complaint  Patient presents with  . Congestive Heart Failure     History of Present Illness: Nathan Mills is a 81 y.o. male with a hx of chronic afib not on anticoagulation secondary to bleeding risk from recurrent falls, CAD with remote MI in 1982, chronic diastolic HF, mitral regurgitation and cerebrovascular disease.  Echo in 5/13 demonstrated EF 55%, moderate to severe MR and moderate BAE. Last seen by [name] 02/2014. It was noted that, given the patient's advanced age, further carotid Dopplers and echocardiograms would not be performed.  Admitted 6/5-6/9 with Acute on Chronic Diastolic CHF.  Patient was admitted by the internal medicine service. Repeat echo demonstrated severe LVH with EF 55-60%, aneurysmal and akinetic basal inferior wall, mild MR, severe BAE. There was no significant change since previous echocardiogram. He was noted to have mild bradycardia on telemetry. However, he was continued on low-dose beta blocker. There was no indication for pacemaker. Close follow-up was arranged today. Of note, he was placed on an biotics prior to discharge secondary question of URI.  Since discharge from the hospital, he has noted improved breathing. He denies orthopnea or PND. He denies LE edema. He denies chest discomfort. He denies dizziness or syncope. He does have frequent falls. He falls almost every week. He denies fevers or cough.  Studies/Reports Reviewed Today:  Echo 06/18/14 - Severe LVH. EF 55% to 60%. 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 The study is not technically sufficient to allow evaluation of LV diastolic function. - Aortic valve: Trileaflet. Sclerosis without stenosis. There wasno regurgitation. - Mitral valve: Mildly thickened leaflets . There was  mildregurgitation. - Left atrium: Severely dilated at 50 ml/m2. - Right atrium: Severely dilated. - Atrial septum: No defect or patent foramen ovale was identified. - Tricuspid valve: There was mild regurgitation. - Pulmonary arteries: PA peak pressure: 15 mm Hg (S). Impressions:  - No significant change compared to echo in 2013 - there is an aneurysm of the basal inferior wall which is similar in size to the prior study - the neck measures about 3.5 cm in the 2 chamber view. There is severe biatrial enlargement.  Carotid US 01/12/13 Bilateral ICA 40-59%  >> FU 1 year    Past Medical History  Diagnosis Date  . CAD (coronary artery disease)     a. s/p remote MI 8  . Syncope   . TIA (transient ischemic attack)   . Zenker's diverticulum   . GERD (gastroesophageal reflux disease)   . Depression   . Nephrolithiasis   . Hyperlipidemia   . HTN (hypertension)   . Atrial fibrillation     a. Dx 04/2011  . Gallstones   . COPD (chronic obstructive pulmonary disease)   . Peptic stricture of esophagus   . CVA (cerebral infarction)   . Gastric polyp   . Bleeding   . Fall against object   . Acute blood loss anemia   . Syncope   . Hiatal hernia   . Trigeminal neuralgia   . Esophageal dysmotility   . Hiatal hernia     Past Surgical History  Procedure Laterality Date  . Laparoscopic cholecystectomy    . Implantation of a medtronic implantation loop recorder  03/01/2006    Dr. Ladona Ridgel  .  Esophagogastroduodenoscopy  01/24/2009    Esophageal Stricture--Multiple EGD's     Current Outpatient Prescriptions  Medication Sig Dispense Refill  . ALPRAZolam (XANAX) 0.25 MG tablet Take 0.25 mg by mouth at bedtime.     Marland Kitchen amLODipine-atorvastatin (CADUET) 5-20 MG per tablet TAKE 1 TABLET DAILY (Patient taking differently: TAKE 1 TABLET BY MOUTH DAILY) 90 tablet 1  . Cholecalciferol (VITAMIN D) 2000 UNITS tablet Take 2,000 Units by mouth daily.    . clopidogrel (PLAVIX) 75 MG tablet Take 1 tablet  (75 mg total) by mouth daily. 90 tablet 3  . Cyanocobalamin (VITAMIN B12 PO) Take 1 tablet by mouth daily.     Marland Kitchen escitalopram (LEXAPRO) 10 MG tablet Take 1 tablet daily by mouth for anxiety. 30 tablet 1  . finasteride (PROSCAR) 5 MG tablet Take 5 mg by mouth daily.    . furosemide (LASIX) 40 MG tablet Take 1 tablet (40 mg total) by mouth 2 (two) times daily. 60 tablet 0  . GARLIC PO Take 1 tablet by mouth daily.    Marland Kitchen lisinopril (PRINIVIL,ZESTRIL) 2.5 MG tablet Take 1 tablet (2.5 mg total) by mouth daily. 30 tablet 0  . loperamide (IMODIUM A-D) 2 MG tablet Take 1 tablet (2 mg total) by mouth as needed for diarrhea or loose stools. 30 tablet 0  . Multiple Vitamins-Minerals (PRESERVISION/LUTEIN PO) Take 1 capsule by mouth 2 (two) times daily.    . Naproxen Sod-Diphenhydramine (ALEVE PM) 220-25 MG TABS Take 1 tablet by mouth every evening.    Marland Kitchen omeprazole (PRILOSEC) 10 MG capsule Take 1 capsule by mouth daily.    . potassium chloride SA (K-DUR,KLOR-CON) 20 MEQ tablet Take 2 tablets (40 mEq total) by mouth daily. 30 tablet 0  . tamsulosin (FLOMAX) 0.4 MG CAPS capsule Take 1 capsule by mouth daily.    . Wheat Dextrin (BENEFIBER) POWD Take 1 tablespoon by mouth in water or juice once daily  0   No current facility-administered medications for this visit.    Allergies:   Review of patient's allergies indicates no known allergies.    Social History:  The patient  reports that he quit smoking about 53 years ago. He has never used smokeless tobacco. He reports that he does not drink alcohol or use illicit drugs.   Family History:  The patient's family history includes Cancer in his brother, daughter, mother, and sister; Coronary artery disease in an other family member. There is no history of Heart attack.    ROS:   Please see the history of present illness.   Review of Systems  HENT: Positive for hearing loss.   Eyes: Positive for visual disturbance.  Cardiovascular: Positive for dyspnea on  exertion and orthopnea.  Respiratory: Positive for cough and wheezing.   Hematologic/Lymphatic: Positive for bleeding problem. Bruises/bleeds easily.  Gastrointestinal: Positive for diarrhea.  Neurological: Positive for loss of balance.  All other systems reviewed and are negative.     PHYSICAL EXAM: VS:  BP 110/50 mmHg  Pulse 41  Ht  (1.727 m)  Wt 117 lb (53.071 kg)  BMI 17.79 kg/m2    Wt Readings from Last 3 Encounters:  06/28/14 117 lb (53.071 kg)  06/21/14 123 lb 1.6 oz (55.838 kg)  05/20/14 115 lb (52.164 kg)     GEN: Thin frail elderly male sitting in a wheelchair, in no acute distress HEENT: normal Neck: no JVD at 90, no masses Cardiac:  Normal S1/S2, irregularly irregular; no murmur ,  no rubs or gallops, no  edema  Respiratory:  clear to auscultation bilaterally, no wheezing, rhonchi or rales. GI: soft, nontender, nondistended, + BS MS: no deformity or atrophy Skin: warm and dry  Neuro:  CNs II-XII intact, Strength and sensation are intact Psych: Normal affect   EKG:  EKG is ordered today.  It demonstrates:   AFib, HR 41, LAD   Recent Labs: 06/17/2014: ALT 13* 06/18/2014: Magnesium 2.0 06/19/2014: B Natriuretic Peptide 657.0* 06/20/2014: BUN 38*; Creatinine, Ser 1.15; Hemoglobin 10.3*; Platelets 161; Potassium 3.3*; Sodium 144    Lipid Panel    Component Value Date/Time   CHOL 140 03/16/2014 0923   TRIG 81 03/16/2014 0923   HDL 44 03/16/2014 0923   CHOLHDL 3.2 03/16/2014 0923   VLDL 16 03/16/2014 0923   LDLCALC 80 03/16/2014 0923      ASSESSMENT AND PLAN:  Chronic diastolic CHF (congestive heart failure):  Volume appears stable. He is on a fairly large dose of Lasix. I suspect that this will need to be decreased. Review of the notes in the hospital states that his Lasix dose was changed to home regimen. However, it does not appear that he was on Lasix previously. Check BMET today. If BUN or creatinine increased, decrease Lasix to once daily.  Coronary  artery disease involving native coronary artery of native heart without angina pectoris:  He has a h/o remote MI in 1982. Stable w/o chest pain. Recent Echo shows normal LVF and no new WMA. Continue medical therapy with Plavix, Lipitor and lisinopril.     Essential hypertension:  Controlled  HLD (hyperlipidemia):  Continue statin  Carotid stenosis, bilateral:  No further Carotid US to be done.   Bradycardia:  His HR is significantly depressed.  With his long history of falls, I am concerned that bradycardia would lead to even more falls. Stop metoprolol. Arrange 24-hour Holter to ensure that heart rate is controlled in atrial fibrillation off of beta blocker.    Medication Changes: Current medicines are reviewed at length with the patient today.  Concerns regarding medicines are as outlined above.  The following changes have been made:   Discontinued Medications   AMOXICILLIN-CLAVULANATE (AUGMENTIN) 500-125 MG PER TABLET    Take 1 tablet (500 mg total) by mouth 2 (two) times daily.   IBUPROFEN-DIPHENHYDRAMINE CIT 200-38 MG TABS    Take 1 tablet by mouth at bedtime.   METOPROLOL TARTRATE (LOPRESSOR) 25 MG TABLET    Take 0.5 tablets (12.5 mg total) by mouth 2 (two) times daily.   Modified Medications   No medications on file   New Prescriptions   No medications on file     Labs/ tests ordered today include:   Orders Placed This Encounter  Procedures  . Basic Metabolic Panel (BMET)  . Holter monitor - 24 hour  . EKG 12-Lead    Disposition:   FU with [name] 1 month   Signed, [name], PA-C, MHS 06/28/2014 11:09 AM    [facility] [address] [place], [place], [place]  [address] Phone: [phone]; Fax: [phone]

## 2014-06-28 ENCOUNTER — Ambulatory Visit (INDEPENDENT_AMBULATORY_CARE_PROVIDER_SITE_OTHER): Payer: Medicare Other | Admitting: Physician Assistant

## 2014-06-28 ENCOUNTER — Encounter: Payer: Self-pay | Admitting: Physician Assistant

## 2014-06-28 ENCOUNTER — Telehealth: Payer: Self-pay | Admitting: *Deleted

## 2014-06-28 VITALS — BP 110/50 | HR 41 | Ht 68.0 in | Wt 117.0 lb

## 2014-06-28 DIAGNOSIS — R001 Bradycardia, unspecified: Secondary | ICD-10-CM

## 2014-06-28 DIAGNOSIS — I4891 Unspecified atrial fibrillation: Secondary | ICD-10-CM

## 2014-06-28 DIAGNOSIS — I10 Essential (primary) hypertension: Secondary | ICD-10-CM | POA: Diagnosis not present

## 2014-06-28 DIAGNOSIS — I2510 Atherosclerotic heart disease of native coronary artery without angina pectoris: Secondary | ICD-10-CM

## 2014-06-28 DIAGNOSIS — E785 Hyperlipidemia, unspecified: Secondary | ICD-10-CM | POA: Diagnosis not present

## 2014-06-28 DIAGNOSIS — I5032 Chronic diastolic (congestive) heart failure: Secondary | ICD-10-CM | POA: Diagnosis not present

## 2014-06-28 DIAGNOSIS — I6523 Occlusion and stenosis of bilateral carotid arteries: Secondary | ICD-10-CM | POA: Diagnosis not present

## 2014-06-28 DIAGNOSIS — I5033 Acute on chronic diastolic (congestive) heart failure: Secondary | ICD-10-CM

## 2014-06-28 DIAGNOSIS — J449 Chronic obstructive pulmonary disease, unspecified: Secondary | ICD-10-CM

## 2014-06-28 LAB — BASIC METABOLIC PANEL
BUN: 46 mg/dL — ABNORMAL HIGH (ref 6–23)
CO2: 32 mEq/L (ref 19–32)
Calcium: 9.3 mg/dL (ref 8.4–10.5)
Chloride: 103 mEq/L (ref 96–112)
Creatinine, Ser: 1.41 mg/dL (ref 0.40–1.50)
GFR: 49.64 mL/min — ABNORMAL LOW (ref >60.00)
Glucose, Bld: 101 mg/dL — ABNORMAL HIGH (ref 70–99)
POTASSIUM: 4.7 meq/L (ref 3.5–5.1)
SODIUM: 141 meq/L (ref 135–145)

## 2014-06-28 MED ORDER — FUROSEMIDE 40 MG PO TABS: 40.0000 mg | ORAL_TABLET | Freq: Every day | ORAL | Status: DC

## 2014-06-28 NOTE — Telephone Encounter (Signed)
S/w pt's daughter DPR on file about lab results and med changes as well as repeat bmet 6/29. Daughter verbalized plan of care with understanding by phone. Daughter states when I said to decrease K+ to 20 meq daily; she states that pt has been taking 40 meq bid; they thought that was on the dc from [facility], they misread the K+ directions. I d/w PA to advise that pt was not taking K+ the way as shown in his chart and that he was actually taking K+ 20 meq 2 tabs BID, not 20 meq 2 tabs daily. Per Bing Neighbors. PA he changed his directions on the lab results to decrease K+ to 20 meq 2 tabs daily = 40 meq once a day. Daughter Cecelia verbalized understanding to all of these instructions and plan of care.

## 2014-06-28 NOTE — Patient Instructions (Signed)
Medication Instructions:  1. STOP METOPROLOL  Labwork: TODAY BMET  Testing/Procedures: Your physician has recommended that you wear a 24 HOUR holter monitor. PLEASE SCHEDULE ONE DAY NEXT WEEK.  Holter monitors are medical devices that record the heart's electrical activity. Doctors most often use these monitors to diagnose arrhythmias. Arrhythmias are problems with the speed or rhythm of the heartbeat. The monitor is a small, portable device. You can wear one while you do your normal daily activities. This is usually used to diagnose what is causing palpitations/syncope (passing out).   Follow-Up: [name] OR NP/PA IN 4 WEEKS AT THE [facility]  Any Other Special Instructions Will Be Listed Below (If Applicable).

## 2014-07-02 ENCOUNTER — Ambulatory Visit (INDEPENDENT_AMBULATORY_CARE_PROVIDER_SITE_OTHER): Payer: Medicare Other

## 2014-07-02 DIAGNOSIS — I4891 Unspecified atrial fibrillation: Secondary | ICD-10-CM

## 2014-07-02 DIAGNOSIS — R001 Bradycardia, unspecified: Secondary | ICD-10-CM | POA: Diagnosis not present

## 2014-07-04 ENCOUNTER — Other Ambulatory Visit: Payer: Self-pay | Admitting: Internal Medicine

## 2014-07-09 ENCOUNTER — Other Ambulatory Visit: Payer: Self-pay | Admitting: Internal Medicine

## 2014-07-11 ENCOUNTER — Other Ambulatory Visit (INDEPENDENT_AMBULATORY_CARE_PROVIDER_SITE_OTHER): Payer: Medicare Other | Admitting: *Deleted

## 2014-07-11 DIAGNOSIS — I5033 Acute on chronic diastolic (congestive) heart failure: Secondary | ICD-10-CM

## 2014-07-11 LAB — BASIC METABOLIC PANEL
BUN: 33 mg/dL — AB (ref 6–23)
CO2: 30 meq/L (ref 19–32)
CREATININE: 1.18 mg/dL (ref 0.40–1.50)
Calcium: 9.1 mg/dL (ref 8.4–10.5)
Chloride: 105 mEq/L (ref 96–112)
GFR: 60.96 mL/min (ref >60.00)
Glucose, Bld: 136 mg/dL — ABNORMAL HIGH (ref 70–99)
Potassium: 4.5 mEq/L (ref 3.5–5.1)
Sodium: 141 mEq/L (ref 135–145)

## 2014-07-13 ENCOUNTER — Telehealth: Payer: Self-pay | Admitting: *Deleted

## 2014-07-13 NOTE — Telephone Encounter (Signed)
Daughter Juliette AlcideMelinda aware of results with verbal understandign by phone. She states pt has some mild dementia.

## 2014-07-18 ENCOUNTER — Ambulatory Visit: Payer: Federal, State, Local not specified - PPO | Admitting: Cardiology

## 2014-07-26 NOTE — Progress Notes (Signed)
HPI: FU permanent afib not on anticoagulation secondary to bleeding risk from recurrent falls, CAD with remote MI in 1982, chronic diastolic HF, mitral regurgitation and cerebrovascular disease.It was noted previously that, given the patient's advanced age, further carotid Dopplers and echocardiograms would not be performed. Holter 6/16 showed afib with pvcs or aberrantly conducted beats. Beta blocker DCed previously due to bradycardia. Since last seen, he denies dyspnea, chest pain, palpitations or syncope. He did fall last week.  Studies/Reports Reviewed Today:  Echo 06/18/14 - Severe LVH. EF 55% to 60%. 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 The study is not technically sufficient to allow evaluation of LV diastolic function. - Aortic valve: Trileaflet. Sclerosis without stenosis. There wasno regurgitation. - Mitral valve: Mildly thickened leaflets . There was mildregurgitation. - Left atrium: Severely dilated at 50 ml/m2. - Right atrium: Severely dilated. - Atrial septum: No defect or patent foramen ovale was identified. - Tricuspid valve: There was mild regurgitation. - Pulmonary arteries: PA peak pressure: 15 mm Hg (S). 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 There is severe biatrial enlargement.  Carotid US 01/12/13 Bilateral ICA 40-59% >> FU 1 year   Current Outpatient Prescriptions  Medication Sig Dispense Refill  . ALPRAZolam (XANAX) 0.25 MG tablet Take 0.25 mg by mouth at bedtime.     Marland Kitchen. amLODipine-atorvastatin (CADUET) 5-20 MG per tablet TAKE 1 TABLET DAILY (Patient taking differently: TAKE 1 TABLET BY MOUTH DAILY) 90 tablet 1  . Cholecalciferol (VITAMIN D) 2000 UNITS tablet Take 2,000 Units by mouth daily.    . clopidogrel (PLAVIX) 75 MG tablet Take 1 tablet (75 mg  total) by mouth daily. 90 tablet 3  . Cyanocobalamin (VITAMIN B12 PO) Take 1 tablet by mouth daily.     Marland Kitchen. escitalopram (LEXAPRO) 10 MG tablet Take 1 tablet daily by mouth for anxiety. 30 tablet 1  . finasteride (PROSCAR) 5 MG tablet Take 5 mg by mouth daily.    Marland Kitchen. GARLIC PO Take 1 tablet by mouth daily.    Marland Kitchen. lisinopril (PRINIVIL,ZESTRIL) 2.5 MG tablet Take 1 tablet (2.5 mg total) by mouth daily. 30 tablet 0  . loperamide (IMODIUM A-D) 2 MG tablet Take 1 tablet (2 mg total) by mouth as needed for diarrhea or loose stools. 30 tablet 0  . Multiple Vitamins-Minerals (PRESERVISION/LUTEIN PO) Take 1 capsule by mouth 2 (two) times daily.    . Naproxen Sod-Diphenhydramine (ALEVE PM) 220-25 MG TABS Take 1 tablet by mouth every evening.    Marland Kitchen. omeprazole (PRILOSEC) 10 MG capsule Take 1 capsule by mouth daily.    . potassium chloride SA (K-DUR,KLOR-CON) 20 MEQ tablet Take 2 tablets (40 mEq total) by mouth daily. 30 tablet 0  . tamsulosin (FLOMAX) 0.4 MG CAPS capsule Take 1 capsule by mouth daily.    . Wheat Dextrin (BENEFIBER) POWD Take 1 tablespoon by mouth in water or juice once daily  0   No current facility-administered medications for this visit.     Past Medical History  Diagnosis Date  . CAD (coronary artery disease)     a. s/p remote MI 831982  . Syncope   . TIA (transient ischemic attack)   . Zenker's diverticulum   . GERD (gastroesophageal reflux disease)   . Depression   .  Nephrolithiasis   . Hyperlipidemia   . HTN (hypertension)   . Atrial fibrillation     a. Dx 04/2011  . Gallstones   . COPD (chronic obstructive pulmonary disease)   . Peptic stricture of esophagus   . CVA (cerebral infarction)   . Gastric polyp   . Bleeding   . Fall against object   . Acute blood loss anemia   . Syncope   . Hiatal hernia   . Trigeminal neuralgia   . Esophageal dysmotility   . Hiatal hernia     Past Surgical History  Procedure Laterality Date  . Laparoscopic cholecystectomy    .  Implantation of a medtronic implantation loop recorder  03/01/2006    Dr. Ladona Ridgel  . Esophagogastroduodenoscopy  01/24/2009    Esophageal Stricture--Multiple EGD's    History   Social History  . Marital Status: Married    Spouse Name: N/A  . Number of Children: 2  . Years of Education: N/A   Occupational History  . retired     Research officer, political party   Social History Main Topics  . Smoking status: Former Smoker    Quit date: 05/07/1961  . Smokeless tobacco: Never Used  . Alcohol Use: No  . Drug Use: No  . Sexual Activity: Not on file   Other Topics Concern  . Not on file   Social History Narrative    ROS: no fevers or chills, productive cough, hemoptysis, dysphasia, odynophagia, melena, hematochezia, dysuria, hematuria, rash, seizure activity, orthopnea, PND, pedal edema, claudication. Remaining systems are negative.  Physical Exam: Well-developed frail in no acute distress.  Skin is warm and dry.  HEENT is normal.  Neck is supple.  Chest is clear to auscultation with normal expansion.  Cardiovascular exam is irregular Abdominal exam nontender or distended. No masses palpated. Extremities show trace ankle edema. neuro grossly intact

## 2014-07-30 ENCOUNTER — Ambulatory Visit (INDEPENDENT_AMBULATORY_CARE_PROVIDER_SITE_OTHER): Payer: Medicare Other | Admitting: Cardiology

## 2014-07-30 ENCOUNTER — Encounter: Payer: Self-pay | Admitting: Cardiology

## 2014-07-30 VITALS — BP 157/78 | HR 81 | Ht 68.0 in | Wt 124.7 lb

## 2014-07-30 DIAGNOSIS — I10 Essential (primary) hypertension: Secondary | ICD-10-CM

## 2014-07-30 DIAGNOSIS — I5033 Acute on chronic diastolic (congestive) heart failure: Secondary | ICD-10-CM

## 2014-07-30 DIAGNOSIS — I6523 Occlusion and stenosis of bilateral carotid arteries: Secondary | ICD-10-CM | POA: Diagnosis not present

## 2014-07-30 DIAGNOSIS — E785 Hyperlipidemia, unspecified: Secondary | ICD-10-CM | POA: Diagnosis not present

## 2014-07-30 DIAGNOSIS — I2510 Atherosclerotic heart disease of native coronary artery without angina pectoris: Secondary | ICD-10-CM | POA: Diagnosis not present

## 2014-07-30 LAB — BASIC METABOLIC PANEL WITH GFR
BUN: 19 mg/dL (ref 6–23)
CALCIUM: 9.2 mg/dL (ref 8.4–10.5)
CHLORIDE: 106 meq/L (ref 96–112)
CO2: 26 mEq/L (ref 19–32)
Creat: 0.96 mg/dL (ref 0.50–1.35)
GFR, Est African American: 77 mL/min
GFR, Est Non African American: 67 mL/min
Glucose, Bld: 89 mg/dL (ref 70–99)
POTASSIUM: 4.8 meq/L (ref 3.5–5.3)
Sodium: 141 mEq/L (ref 135–145)

## 2014-07-30 MED ORDER — FUROSEMIDE 20 MG PO TABS: 20.0000 mg | ORAL_TABLET | Freq: Every day | ORAL | Status: DC | PRN

## 2014-07-30 NOTE — Assessment & Plan Note (Signed)
Continue aspirin and statin. Given age will not pursue further carotid Dopplers.

## 2014-07-30 NOTE — Assessment & Plan Note (Signed)
Patients find status appears to be stable. I have given him Lasix 20 mg daily to take as needed for edema or increased dyspnea. Check potassium and renal function.

## 2014-07-30 NOTE — Assessment & Plan Note (Signed)
Continue aspirin and statin. 

## 2014-07-30 NOTE — Patient Instructions (Signed)
Your physician wants you to follow-up in: 3 MONTHS WITH [name] You will receive a reminder letter in the mail two months in advance. If you don't receive a letter, please call our office to schedule the follow-up appointment.   Your physician wants you to follow-up in: 6 MONTHS WITH DR Jens SomRENSHAW You will receive a reminder letter in the mail two months in advance. If you don't receive a letter, please call our office to schedule the follow-up appointment.   Your physician recommends that you HAVE LAB WORK TODAY  START FUROSEMIDE 20 MG ONCE DAILY AS NEEDED FOR SHORTNESS OF BREATH OR SWELLING

## 2014-07-30 NOTE — Assessment & Plan Note (Signed)
Continue statin. 

## 2014-07-30 NOTE — Assessment & Plan Note (Signed)
Continue present blood pressure medications. 

## 2014-07-30 NOTE — Assessment & Plan Note (Signed)
Rate is controlled on no medications. Continue Plavix. Not a Coumadin candidate given recurrent falls.

## 2014-08-15 ENCOUNTER — Other Ambulatory Visit: Payer: Self-pay | Admitting: Adult Health

## 2014-09-11 ENCOUNTER — Ambulatory Visit: Payer: Federal, State, Local not specified - PPO | Admitting: Cardiology

## 2014-10-24 ENCOUNTER — Other Ambulatory Visit: Payer: Self-pay | Admitting: Physician Assistant

## 2014-10-28 NOTE — Progress Notes (Signed)
Cardiology Office Note   Date:  10/29/2014   ID:  Nathan Mills, DOB Sep 15, 1917, MRN [id number]  PCP:   Duane Lope, MD  Cardiologist:  [name]     Chief Complaint  Patient presents with  . Follow-up  . Congestive Heart Failure  . Coronary Artery Disease     History of Present Illness: Nathan Mills is a 81 y.o. male with a hx of chronic afib not on anticoagulation secondary to bleeding risk from recurrent falls, CAD with remote MI in 1982, chronic diastolic HF, mitral regurgitation and cerebrovascular disease.  Echo in 5/13 demonstrated EF 55%, moderate to severe MR and moderate BAE. It has been noted that, given the patient's advanced age, further carotid Dopplers and echocardiograms would not be performed.  Admitted 6/16 with a/c Diastolic CHF.   Repeat echo demonstrated severe LVH with EF 55-60%, aneurysmal and akinetic basal inferior wall, mild MR, severe BAE. There was no significant change since previous echocardiogram. He was noted to have mild bradycardia on telemetry.  I saw him in FU in 6/16.  I stopped his beta-blocker 2/2 bradycardia.  FU Holter demonstrated controlled VR.  Last seen by [name] 7/16. Returns for follow-up.  Here with his daughter.  She helps with the hx.  She notes that he seems congested over the past 2 days.  He notes a non-productive cough. He denies fever.  He is chronically short of breath.  He is NYHA 3. Overall, his breathing is stable.  He denies orthopnea, PND, edema.  He denies syncope, dizziness.     Studies/Reports Reviewed Today:  Holter 6/16 afib with pvcs or aberrantly conducted beats, rate controlled  Echo 06/18/14 Severe LVH, EF 55-60%, aneurysmal and akinetic inferior wall, aortic sclerosis, mild MR, severe LAE, severe RAE, mild TR, PASP 15 mmHg  Carotid US 01/12/13 Bilateral ICA 40-59%  >> FU 1 year     Past Medical History  Diagnosis Date  . CAD (coronary artery disease)     a. s/p remote MI 49  .  Syncope   . TIA (transient ischemic attack)   . Zenker's diverticulum   . GERD (gastroesophageal reflux disease)   . Depression   . Nephrolithiasis   . Hyperlipidemia   . HTN (hypertension)   . Atrial fibrillation (HCC)     a. Dx 04/2011  . Gallstones   . COPD (chronic obstructive pulmonary disease) (HCC)   . Peptic stricture of esophagus   . CVA (cerebral infarction)   . Gastric polyp   . Bleeding   . Fall against object   . Acute blood loss anemia   . Syncope   . Hiatal hernia   . Trigeminal neuralgia   . Esophageal dysmotility   . Hiatal hernia     Past Surgical History  Procedure Laterality Date  . Laparoscopic cholecystectomy    . Implantation of a medtronic implantation loop recorder  03/01/2006    Dr. Ladona Ridgel  . Esophagogastroduodenoscopy  01/24/2009    Esophageal Stricture--Multiple EGD's     Current Outpatient Prescriptions  Medication Sig Dispense Refill  . ALPRAZolam (XANAX) 0.25 MG tablet Take 0.25 mg by mouth at bedtime.     Marland Kitchen amLODipine-atorvastatin (CADUET) 5-20 MG per tablet TAKE 1 TABLET DAILY (Patient taking differently: TAKE 1 TABLET BY MOUTH DAILY) 90 tablet 1  . Cholecalciferol (VITAMIN D) 2000 UNITS tablet Take 2,000 Units by mouth daily.    . clopidogrel (PLAVIX) 75 MG tablet Take 1 tablet (75 mg  total) by mouth daily. 90 tablet 3  . Cyanocobalamin (VITAMIN B12 PO) Take 1 tablet by mouth daily.     Marland Kitchen. escitalopram (LEXAPRO) 10 MG tablet Take 1 tablet daily by mouth for anxiety. 30 tablet 1  . finasteride (PROSCAR) 5 MG tablet Take 5 mg by mouth daily.    . furosemide (LASIX) 20 MG tablet Take 1 tablet (20 mg total) by mouth daily as needed. 90 tablet 3  . GARLIC PO Take 1 tablet by mouth daily.    Marland Kitchen. loperamide (IMODIUM A-D) 2 MG tablet Take 1 tablet (2 mg total) by mouth as needed for diarrhea or loose stools. 30 tablet 0  . Multiple Vitamins-Minerals (PRESERVISION/LUTEIN PO) Take 1 capsule by mouth 2 (two) times daily.    Marland Kitchen. omeprazole (PRILOSEC) 10  MG capsule Take 1 capsule by mouth daily.    . potassium chloride SA (K-DUR,KLOR-CON) 20 MEQ tablet Take 2 tablets (40 mEq total) by mouth daily. 30 tablet 0  . tamsulosin (FLOMAX) 0.4 MG CAPS capsule Take 1 capsule by mouth daily.    . Wheat Dextrin (BENEFIBER) POWD Take 1 tablespoon by mouth in water or juice once daily  0   No current facility-administered medications for this visit.    Allergies:   Review of patient's allergies indicates no known allergies.    Social History:  The patient  reports that he quit smoking about 53 years ago. He has never used smokeless tobacco. He reports that he does not drink alcohol or use illicit drugs.   Family History:  The patient's family history includes Cancer in his brother, daughter, mother, and sister; Coronary artery disease in an other family member. There is no history of Heart attack.    ROS:   Please see the history of present illness.   Review of Systems  Constitution: Positive for malaise/fatigue and weight loss.  HENT: Positive for hearing loss.   Cardiovascular: Positive for dyspnea on exertion.  Respiratory: Positive for cough and wheezing.   Hematologic/Lymphatic: Bruises/bleeds easily.  Gastrointestinal: Positive for diarrhea.  Neurological: Positive for loss of balance.  All other systems reviewed and are negative.     PHYSICAL EXAM: VS:  BP 120/60 mmHg  Pulse 85  Ht 5\' 8"  (1.727 m)  Wt 118 lb 6.4 oz (53.706 kg)  BMI 18.01 kg/m2    Wt Readings from Last 3 Encounters:  10/29/14 118 lb 6.4 oz (53.706 kg)  07/30/14 124 lb 11.2 oz (56.564 kg)  06/28/14 117 lb (53.071 kg)     GEN: Thin frail elderly male  in no acute distress HEENT: normal Neck: no JVD, no masses Cardiac:  Normal S1/S2, irregularly irregular; no murmur ,  no rubs or gallops, trace bilateral ankle edema  Respiratory:  Decreased breath sounds bilaterally (R >> L) with R basilar crackles, no wheezing GI: soft, nontender, nondistended, + BS MS: no  deformity or atrophy Skin: warm and dry  Neuro:  CNs II-XII intact, Strength and sensation are intact Psych: Normal affect   EKG:  EKG is ordered today.  It demonstrates:  AFib, HR 85, LAD, no change from prior tracing.   Recent Labs: 06/17/2014: ALT 13* 06/18/2014: Magnesium 2.0 06/19/2014: B Natriuretic Peptide 657.0* 06/20/2014: Hemoglobin 10.3*; Platelets 161 07/30/2014: BUN 19; Creat 0.96; Potassium 4.8; Sodium 141    Lipid Panel    Component Value Date/Time   CHOL 140 03/16/2014 0923   TRIG 81 03/16/2014 0923   HDL 44 03/16/2014 0923   CHOLHDL 3.2 03/16/2014 [id number]  VLDL 16 03/16/2014 0923   LDLCALC 80 03/16/2014 0923      ASSESSMENT AND PLAN:  1. Cough:  His lung is exam is abnormal.  He has a non-productive cough, but no fever, no increased dyspnea and no change in fatigue.  I will arrange a BMET, CBC, BNP and CXR.  If CXR is clear, FU with PCP in the next week if cough is no better.  If CXR suggests infiltrate, he will need earlier FU with PCP and antibiotic therapy.    2. Chronic diastolic CHF (congestive heart failure):  Volume appears stable.  He is now on Lasix 20 QD only.  As noted above, will get a CXR given abnormal lung exam. Will also get BNP.  If BNP is significantly elevated or CXR shows edema, will increase Lasix for a few day.   3. Coronary artery disease involving native coronary artery of native heart without angina pectoris:  He has a h/o remote MI in 1982. Stable w/o chest pain. Recent Echo shows normal LVF and no new WMA. Continue medical therapy with Plavix, Lipitor.     4. Essential hypertension:  Controlled  5. HLD (hyperlipidemia):  Continue statin  6. Carotid stenosis, bilateral:  No further Carotid US to be done.   7. Atrial Fibrillation:  Chronic.  Not on anticoagulation 2/2 bleeding risk and falls.  Rate is controlled on no rate controlling drugs.       Medication Changes: Current medicines are reviewed at length with the patient today.   Concerns regarding medicines are as outlined above.  The following changes have been made:   Discontinued Medications   LISINOPRIL (PRINIVIL,ZESTRIL) 2.5 MG TABLET    Take 1 tablet (2.5 mg total) by mouth daily.   NAPROXEN SOD-DIPHENHYDRAMINE (ALEVE PM) 220-25 MG TABS    Take 1 tablet by mouth every evening.   Modified Medications   No medications on file   New Prescriptions   No medications on file   Labs/ tests ordered today include:   Orders Placed This Encounter  Procedures  . DG Chest 2 View  . Basic Metabolic Panel (BMET)  . CBC w/Diff  . B Nat Peptide  . EKG 12-Lead     Disposition:    FU with [name] 01/2015 as planned    Signed, Brynda Rim, MHS 10/29/2014 12:36 PM    [facility] [address] [place], [place], [place]  [address] Phone: [phone]; Fax: [phone]

## 2014-10-29 ENCOUNTER — Ambulatory Visit (INDEPENDENT_AMBULATORY_CARE_PROVIDER_SITE_OTHER): Payer: Medicare Other | Admitting: Physician Assistant

## 2014-10-29 ENCOUNTER — Ambulatory Visit
Admission: RE | Admit: 2014-10-29 | Discharge: 2014-10-29 | Disposition: A | Discharge status: Home / Self Care | Payer: Medicare Other | Source: Ambulatory Visit | Attending: Physician Assistant | Admitting: Physician Assistant

## 2014-10-29 ENCOUNTER — Encounter: Payer: Self-pay | Admitting: Physician Assistant

## 2014-10-29 VITALS — BP 120/60 | HR 85 | Ht 68.0 in | Wt 118.4 lb

## 2014-10-29 DIAGNOSIS — I482 Chronic atrial fibrillation, unspecified: Secondary | ICD-10-CM

## 2014-10-29 DIAGNOSIS — R059 Cough, unspecified: Secondary | ICD-10-CM

## 2014-10-29 DIAGNOSIS — I5032 Chronic diastolic (congestive) heart failure: Secondary | ICD-10-CM | POA: Diagnosis not present

## 2014-10-29 DIAGNOSIS — R05 Cough: Secondary | ICD-10-CM

## 2014-10-29 DIAGNOSIS — I10 Essential (primary) hypertension: Secondary | ICD-10-CM

## 2014-10-29 DIAGNOSIS — I6523 Occlusion and stenosis of bilateral carotid arteries: Secondary | ICD-10-CM

## 2014-10-29 DIAGNOSIS — R0602 Shortness of breath: Secondary | ICD-10-CM | POA: Diagnosis not present

## 2014-10-29 DIAGNOSIS — I2510 Atherosclerotic heart disease of native coronary artery without angina pectoris: Secondary | ICD-10-CM | POA: Diagnosis not present

## 2014-10-29 DIAGNOSIS — E785 Hyperlipidemia, unspecified: Secondary | ICD-10-CM

## 2014-10-29 LAB — CBC WITH DIFFERENTIAL/PLATELET
Basophils Absolute: 0 10*3/uL (ref 0.0–0.1)
Basophils Relative: 0 % (ref 0–1)
EOS PCT: 5 % (ref 0–5)
Eosinophils Absolute: 0.4 10*3/uL (ref 0.0–0.7)
HEMATOCRIT: 33.1 % — AB (ref 39.0–52.0)
Hemoglobin: 10.6 g/dL — ABNORMAL LOW (ref 13.0–17.0)
LYMPHS PCT: 20 % (ref 12–46)
Lymphs Abs: 1.5 10*3/uL (ref 0.7–4.0)
MCH: 30.6 pg (ref 26.0–34.0)
MCHC: 32 g/dL (ref 30.0–36.0)
MCV: 95.7 fL (ref 78.0–100.0)
MONOS PCT: 7 % (ref 3–12)
MPV: 10 fL (ref 8.6–12.4)
Monocytes Absolute: 0.5 10*3/uL (ref 0.1–1.0)
Neutro Abs: 5 10*3/uL (ref 1.7–7.7)
Neutrophils Relative %: 68 % (ref 43–77)
Platelets: 165 10*3/uL (ref 150–400)
RBC: 3.46 MIL/uL — AB (ref 4.22–5.81)
RDW: 14.2 % (ref 11.5–15.5)
WBC: 7.3 10*3/uL (ref 4.0–10.5)

## 2014-10-29 LAB — BASIC METABOLIC PANEL
BUN: 30 mg/dL — ABNORMAL HIGH (ref 7–25)
CO2: 24 mmol/L (ref 20–31)
Calcium: 9.2 mg/dL (ref 8.6–10.3)
Chloride: 104 mmol/L (ref 98–110)
Creat: 1.14 mg/dL — ABNORMAL HIGH (ref 0.70–1.11)
Glucose, Bld: 110 mg/dL — ABNORMAL HIGH (ref 65–99)
Potassium: 4.3 mmol/L (ref 3.5–5.3)
Sodium: 143 mmol/L (ref 135–146)

## 2014-10-29 NOTE — Patient Instructions (Signed)
Medication Instructions:  Your physician recommends that you continue on your current medications as directed. Please refer to the Current Medication list given to you today.   Labwork: TODAY BMET, CBC W/DIFF, BNP  Testing/Procedures: A chest x-ray takes a picture of the organs and structures inside the chest, including the heart, lungs, and blood vessels. This test can show several things, including, whether the heart is enlarges; whether fluid is building up in the lungs; and whether pacemaker / defibrillator leads are still in place. [address] [facility] [facility]   Follow-Up: 01/2015 DR. CRENSHAW  Any Other Special Instructions Will Be Listed Below (If Applicable).

## 2014-10-30 ENCOUNTER — Telehealth: Payer: Self-pay | Admitting: *Deleted

## 2014-10-30 NOTE — Telephone Encounter (Signed)
DPR on file for pt's daughter who has been notified of CXR results by phone with verbal understanding.

## 2014-10-31 ENCOUNTER — Other Ambulatory Visit (INDEPENDENT_AMBULATORY_CARE_PROVIDER_SITE_OTHER): Payer: Medicare Other | Admitting: *Deleted

## 2014-10-31 ENCOUNTER — Telehealth: Payer: Self-pay | Admitting: *Deleted

## 2014-10-31 DIAGNOSIS — E785 Hyperlipidemia, unspecified: Secondary | ICD-10-CM

## 2014-10-31 DIAGNOSIS — I5033 Acute on chronic diastolic (congestive) heart failure: Secondary | ICD-10-CM

## 2014-10-31 MED ORDER — POTASSIUM CHLORIDE CRYS ER 20 MEQ PO TBCR: 20.0000 meq | EXTENDED_RELEASE_TABLET | Freq: Every day | ORAL | Status: DC | PRN

## 2014-10-31 MED ORDER — FUROSEMIDE 20 MG PO TABS: 20.0000 mg | ORAL_TABLET | Freq: Every day | ORAL | Status: DC | PRN

## 2014-10-31 NOTE — Telephone Encounter (Signed)
S/w pt's daughter Juliette AlcideMelinda who has been notified of lab results and med changes. Stop lasix and K+ on a daily basis, change to lasix 20 mg daily PRN and K+ 20 meq daily PRN. BMET 10/28. Will call when BNP results are in. Daughter verbalized plan of care.

## 2014-10-31 NOTE — Addendum Note (Signed)
Addended by: Tonita PhoenixBOWDEN, ROBIN K on: 10/31/2014 02:13 PM   Modules accepted: Orders

## 2014-11-01 LAB — BRAIN NATRIURETIC PEPTIDE: Brain Natriuretic Peptide: 669.1 pg/mL — ABNORMAL HIGH (ref 0.0–100.0)

## 2014-11-05 ENCOUNTER — Telehealth: Payer: Self-pay | Admitting: *Deleted

## 2014-11-05 NOTE — Telephone Encounter (Signed)
Lmptcb to go over lab results and med changes.

## 2014-11-05 NOTE — Telephone Encounter (Signed)
Daughter cb and has been notified of lab results and findings and to have pt take lasix 20 mg BID x 2 days w/K+ 20 meq BID x 2 days then resume PRN. BMET 10/28 already scheduled.

## 2014-11-09 ENCOUNTER — Other Ambulatory Visit (INDEPENDENT_AMBULATORY_CARE_PROVIDER_SITE_OTHER): Payer: Medicare Other

## 2014-11-09 DIAGNOSIS — I5033 Acute on chronic diastolic (congestive) heart failure: Secondary | ICD-10-CM | POA: Diagnosis not present

## 2014-11-09 LAB — BASIC METABOLIC PANEL
BUN: 27 mg/dL — AB (ref 7–25)
CO2: 28 mmol/L (ref 20–31)
Calcium: 8.8 mg/dL (ref 8.6–10.3)
Chloride: 106 mmol/L (ref 98–110)
Creat: 0.99 mg/dL (ref 0.70–1.11)
GLUCOSE: 90 mg/dL (ref 65–99)
Potassium: 4.4 mmol/L (ref 3.5–5.3)
Sodium: 142 mmol/L (ref 135–146)

## 2014-11-13 ENCOUNTER — Telehealth: Payer: Self-pay | Admitting: *Deleted

## 2014-11-13 NOTE — Telephone Encounter (Signed)
Pt ok for us to s/w daughter Nathan Mills as well. [name] notified of lab results with verbal understanding . Advised to continue to weigh daily and if wt up 2-3 lb x 1 day or 5 lb 1 week or increased sob then take lasix/K+ PRN.

## 2014-11-18 ENCOUNTER — Emergency Department (HOSPITAL_COMMUNITY): Payer: Medicare Other

## 2014-11-18 ENCOUNTER — Inpatient Hospital Stay (HOSPITAL_COMMUNITY)
Admission: EM | Admit: 2014-11-18 | Discharge: 2014-11-20 | DRG: 291 | Disposition: A | Discharge status: Home Health | Payer: Medicare Other | Attending: Internal Medicine | Admitting: Internal Medicine

## 2014-11-18 ENCOUNTER — Encounter (HOSPITAL_COMMUNITY): Payer: Self-pay | Admitting: Emergency Medicine

## 2014-11-18 DIAGNOSIS — Z8249 Family history of ischemic heart disease and other diseases of the circulatory system: Secondary | ICD-10-CM | POA: Diagnosis not present

## 2014-11-18 DIAGNOSIS — Z7902 Long term (current) use of antithrombotics/antiplatelets: Secondary | ICD-10-CM

## 2014-11-18 DIAGNOSIS — K219 Gastro-esophageal reflux disease without esophagitis: Secondary | ICD-10-CM | POA: Diagnosis present

## 2014-11-18 DIAGNOSIS — I4891 Unspecified atrial fibrillation: Secondary | ICD-10-CM | POA: Diagnosis present

## 2014-11-18 DIAGNOSIS — I482 Chronic atrial fibrillation: Secondary | ICD-10-CM | POA: Diagnosis not present

## 2014-11-18 DIAGNOSIS — I110 Hypertensive heart disease with heart failure: Secondary | ICD-10-CM | POA: Diagnosis present

## 2014-11-18 DIAGNOSIS — I5033 Acute on chronic diastolic (congestive) heart failure: Secondary | ICD-10-CM

## 2014-11-18 DIAGNOSIS — F32A Depression, unspecified: Secondary | ICD-10-CM | POA: Diagnosis present

## 2014-11-18 DIAGNOSIS — N400 Enlarged prostate without lower urinary tract symptoms: Secondary | ICD-10-CM | POA: Diagnosis present

## 2014-11-18 DIAGNOSIS — K222 Esophageal obstruction: Secondary | ICD-10-CM | POA: Diagnosis present

## 2014-11-18 DIAGNOSIS — J9601 Acute respiratory failure with hypoxia: Secondary | ICD-10-CM | POA: Diagnosis present

## 2014-11-18 DIAGNOSIS — Z87891 Personal history of nicotine dependence: Secondary | ICD-10-CM

## 2014-11-18 DIAGNOSIS — I5043 Acute on chronic combined systolic (congestive) and diastolic (congestive) heart failure: Principal | ICD-10-CM | POA: Diagnosis present

## 2014-11-18 DIAGNOSIS — Z7982 Long term (current) use of aspirin: Secondary | ICD-10-CM

## 2014-11-18 DIAGNOSIS — R0602 Shortness of breath: Secondary | ICD-10-CM | POA: Diagnosis present

## 2014-11-18 DIAGNOSIS — E785 Hyperlipidemia, unspecified: Secondary | ICD-10-CM | POA: Diagnosis present

## 2014-11-18 DIAGNOSIS — I509 Heart failure, unspecified: Secondary | ICD-10-CM

## 2014-11-18 DIAGNOSIS — I679 Cerebrovascular disease, unspecified: Secondary | ICD-10-CM

## 2014-11-18 DIAGNOSIS — D638 Anemia in other chronic diseases classified elsewhere: Secondary | ICD-10-CM | POA: Diagnosis present

## 2014-11-18 DIAGNOSIS — F329 Major depressive disorder, single episode, unspecified: Secondary | ICD-10-CM | POA: Diagnosis present

## 2014-11-18 DIAGNOSIS — I10 Essential (primary) hypertension: Secondary | ICD-10-CM

## 2014-11-18 LAB — BASIC METABOLIC PANEL
Anion gap: 7 (ref 5–15)
BUN: 24 mg/dL — ABNORMAL HIGH (ref 6–20)
CO2: 27 mmol/L (ref 22–32)
Calcium: 9 mg/dL (ref 8.9–10.3)
Chloride: 106 mmol/L (ref 101–111)
Creatinine, Ser: 0.84 mg/dL (ref 0.61–1.24)
GFR calc Af Amer: >60 mL/min (ref >60)
GFR calc non Af Amer: >60 mL/min (ref >60)
Glucose, Bld: 111 mg/dL — ABNORMAL HIGH (ref 65–99)
Potassium: 5.1 mmol/L (ref 3.5–5.1)
SODIUM: 140 mmol/L (ref 135–145)

## 2014-11-18 LAB — CBC
HCT: 33.8 % — ABNORMAL LOW (ref 39.0–52.0)
Hemoglobin: 10.8 g/dL — ABNORMAL LOW (ref 13.0–17.0)
MCH: 31 pg (ref 26.0–34.0)
MCHC: 32 g/dL (ref 30.0–36.0)
MCV: 97.1 fL (ref 78.0–100.0)
PLATELETS: 224 10*3/uL (ref 150–400)
RBC: 3.48 MIL/uL — ABNORMAL LOW (ref 4.22–5.81)
RDW: 15.4 % (ref 11.5–15.5)
WBC: 8 10*3/uL (ref 4.0–10.5)

## 2014-11-18 LAB — TSH: TSH: 3.115 u[IU]/mL (ref 0.350–4.500)

## 2014-11-18 LAB — PHOSPHORUS: PHOSPHORUS: 3.6 mg/dL (ref 2.5–4.6)

## 2014-11-18 LAB — BRAIN NATRIURETIC PEPTIDE: B Natriuretic Peptide: 591.3 pg/mL — ABNORMAL HIGH (ref 0.0–100.0)

## 2014-11-18 LAB — TROPONIN I: Troponin I: <0.03 ng/mL (ref <0.031)

## 2014-11-18 LAB — MAGNESIUM: MAGNESIUM: 2 mg/dL (ref 1.7–2.4)

## 2014-11-18 LAB — PROTIME-INR
INR: 1.2 (ref 0.00–1.49)
PROTHROMBIN TIME: 15.3 s — AB (ref 11.6–15.2)

## 2014-11-18 LAB — APTT: aPTT: 31 seconds (ref 24–37)

## 2014-11-18 MED ORDER — ACETAMINOPHEN 650 MG RE SUPP
650.0000 mg | Freq: Four times a day (QID) | RECTAL | Status: DC | PRN
Start: 2014-11-18 — End: 2014-11-20

## 2014-11-18 MED ORDER — TAMSULOSIN HCL 0.4 MG PO CAPS
0.4000 mg | ORAL_CAPSULE | Freq: Every day | ORAL | Status: DC
Start: 2014-11-18 — End: 2014-11-20
  Administered 2014-11-18 – 2014-11-20 (×3): 0.4 mg via ORAL
  Filled 2014-11-18 (×3): qty 1

## 2014-11-18 MED ORDER — ONDANSETRON HCL 4 MG PO TABS: 4.0000 mg | ORAL_TABLET | Freq: Four times a day (QID) | ORAL | Status: DC | PRN

## 2014-11-18 MED ORDER — ENSURE ENLIVE PO LIQD
237.0000 mL | Freq: Two times a day (BID) | ORAL | Status: DC
  Administered 2014-11-18 – 2014-11-20 (×5): 237 mL via ORAL

## 2014-11-18 MED ORDER — AMLODIPINE-ATORVASTATIN 5-20 MG PO TABS: 1.0000 | ORAL_TABLET | Freq: Every day | ORAL | Status: DC

## 2014-11-18 MED ORDER — ONDANSETRON HCL 4 MG/2ML IJ SOLN: 4.0000 mg | Freq: Four times a day (QID) | INTRAMUSCULAR | Status: DC | PRN

## 2014-11-18 MED ORDER — ATORVASTATIN CALCIUM 10 MG PO TABS
20.0000 mg | ORAL_TABLET | Freq: Every day | ORAL | Status: DC
  Administered 2014-11-18 – 2014-11-19 (×2): 20 mg via ORAL
  Filled 2014-11-18 (×2): qty 2

## 2014-11-18 MED ORDER — VITAMIN D 1000 UNITS PO TABS
2000.0000 [IU] | ORAL_TABLET | Freq: Every day | ORAL | Status: DC
  Administered 2014-11-18 – 2014-11-20 (×3): 2000 [IU] via ORAL
  Filled 2014-11-18 (×3): qty 2

## 2014-11-18 MED ORDER — SODIUM CHLORIDE 0.9 % IV SOLN
INTRAVENOUS | Status: DC
Start: 2014-11-18 — End: 2014-11-18

## 2014-11-18 MED ORDER — FINASTERIDE 5 MG PO TABS
5.0000 mg | ORAL_TABLET | Freq: Every day | ORAL | Status: DC
  Administered 2014-11-18 – 2014-11-20 (×3): 5 mg via ORAL
  Filled 2014-11-18 (×3): qty 1

## 2014-11-18 MED ORDER — INFLUENZA VAC SPLIT QUAD 0.5 ML IM SUSY
0.5000 mL | PREFILLED_SYRINGE | INTRAMUSCULAR | Status: DC
  Filled 2014-11-18 (×2): qty 0.5

## 2014-11-18 MED ORDER — VITAMIN B-12 100 MCG PO TABS
100.0000 ug | ORAL_TABLET | Freq: Every day | ORAL | Status: DC
  Administered 2014-11-18 – 2014-11-20 (×3): 100 ug via ORAL
  Filled 2014-11-18 (×4): qty 1

## 2014-11-18 MED ORDER — SODIUM CHLORIDE 0.9 % IJ SOLN
3.0000 mL | Freq: Two times a day (BID) | INTRAMUSCULAR | Status: DC
  Administered 2014-11-18 – 2014-11-20 (×5): 3 mL via INTRAVENOUS

## 2014-11-18 MED ORDER — FUROSEMIDE 10 MG/ML IJ SOLN
20.0000 mg | Freq: Every day | INTRAMUSCULAR | Status: DC
  Administered 2014-11-18 – 2014-11-20 (×3): 20 mg via INTRAVENOUS
  Filled 2014-11-18: qty 2
  Filled 2014-11-18: qty 4
  Filled 2014-11-18: qty 2

## 2014-11-18 MED ORDER — ACETAMINOPHEN 325 MG PO TABS: 650.0000 mg | ORAL_TABLET | Freq: Four times a day (QID) | ORAL | Status: DC | PRN

## 2014-11-18 MED ORDER — ASPIRIN EC 81 MG PO TBEC
81.0000 mg | DELAYED_RELEASE_TABLET | Freq: Every day | ORAL | Status: DC
  Administered 2014-11-18 – 2014-11-20 (×3): 81 mg via ORAL
  Filled 2014-11-18 (×4): qty 1

## 2014-11-18 MED ORDER — AMLODIPINE BESYLATE 5 MG PO TABS
5.0000 mg | ORAL_TABLET | Freq: Every day | ORAL | Status: DC
  Administered 2014-11-18 – 2014-11-19 (×2): 5 mg via ORAL
  Filled 2014-11-18 (×2): qty 1

## 2014-11-18 MED ORDER — PANTOPRAZOLE SODIUM 40 MG PO TBEC
40.0000 mg | DELAYED_RELEASE_TABLET | Freq: Every day | ORAL | Status: DC
  Administered 2014-11-18 – 2014-11-20 (×3): 40 mg via ORAL
  Filled 2014-11-18 (×3): qty 1

## 2014-11-18 MED ORDER — ALPRAZOLAM 0.25 MG PO TABS
0.2500 mg | ORAL_TABLET | Freq: Every day | ORAL | Status: DC
  Administered 2014-11-18 – 2014-11-19 (×2): 0.25 mg via ORAL
  Filled 2014-11-18 (×2): qty 1

## 2014-11-18 MED ORDER — ESCITALOPRAM OXALATE 10 MG PO TABS
10.0000 mg | ORAL_TABLET | Freq: Every day | ORAL | Status: DC
  Administered 2014-11-18 – 2014-11-20 (×3): 10 mg via ORAL
  Filled 2014-11-18 (×3): qty 1

## 2014-11-18 MED ORDER — CLOPIDOGREL BISULFATE 75 MG PO TABS
75.0000 mg | ORAL_TABLET | Freq: Every day | ORAL | Status: DC
  Administered 2014-11-18 – 2014-11-20 (×3): 75 mg via ORAL
  Filled 2014-11-18 (×3): qty 1

## 2014-11-18 NOTE — ED Provider Notes (Signed)
CSN: [id number]     Arrival date & time 11/18/14  0750 History   First MD Initiated Contact with Patient 11/18/14 0801     Chief Complaint  Patient presents with  . Shortness of Breath     (Consider location/radiation/quality/duration/timing/severity/associated sxs/prior Treatment) Patient is a 81 y.o. male presenting with shortness of breath. The history is provided by the patient.  Shortness of Breath Associated symptoms: chest pain and cough   Associated symptoms: no abdominal pain, no fever, no headaches, no rash and no vomiting    patient resents with shortness of breath. History of CHF and COPD. Has been doing worse over last few days. Cardiology has been adjusting his Lasix without real improvement. He's had a cough with minimal sputum production. Slight chest pain. No fevers. No swelling in his legs. His weight has been stable at 114 per the patient. Found to be hypoxic at 88% on room air for EMS. He is not on oxygen at baseline. Sets up in the upper 90s on nasal cannula.  History reviewed. No pertinent past medical history. Past Surgical History  Procedure Laterality Date  . Laparoscopic cholecystectomy    . Implantation of a medtronic implantation loop recorder  03/01/2006    Dr. Ladona Ridgel  . Esophagogastroduodenoscopy  01/24/2009    Esophageal Stricture--Multiple EGD's   Family History  Problem Relation Age of Onset  . Coronary artery disease    . Cancer Daughter     eye cancer  . Heart attack Neg Hx   . Cancer Mother   . Cancer Sister   . Cancer Brother    Social History  Substance Use Topics  . Smoking status: Former Smoker    Quit date: 05/07/1961  . Smokeless tobacco: Never Used  . Alcohol Use: No    Review of Systems  Constitutional: Negative for fever, chills, activity change and appetite change.  Eyes: Negative for pain.  Respiratory: Positive for cough and shortness of breath. Negative for chest tightness.   Cardiovascular: Positive for chest pain.  Negative for leg swelling.  Gastrointestinal: Negative for nausea, vomiting, abdominal pain and diarrhea.  Genitourinary: Negative for flank pain.  Musculoskeletal: Negative for back pain and neck stiffness.  Skin: Negative for rash.  Neurological: Negative for weakness, numbness and headaches.  Psychiatric/Behavioral: Negative for behavioral problems.      Allergies  Review of patient's allergies indicates no known allergies.  Home Medications   Prior to Admission medications   Medication Sig Start Date End Date Taking? Authorizing Provider  ALPRAZolam (XANAX) 0.25 MG tablet Take 0.25 mg by mouth at bedtime.    Yes Historical Provider, MD  amLODipine-atorvastatin (CADUET) 5-20 MG per tablet TAKE 1 TABLET DAILY Patient taking differently: TAKE 1 TABLET BY MOUTH DAILY   Yes Lewayne Bunting, MD  Cholecalciferol (VITAMIN D) 2000 UNITS tablet Take 2,000 Units by mouth daily.   Yes Historical Provider, MD  clopidogrel (PLAVIX) 75 MG tablet Take 1 tablet (75 mg total) by mouth daily. 03/19/13  Yes Adeline Joselyn Glassman, MD  Cyanocobalamin (VITAMIN B12 PO) Take 1 tablet by mouth daily.    Yes Historical Provider, MD  escitalopram (LEXAPRO) 10 MG tablet Take 1 tablet daily by mouth for anxiety. 12/06/13  Yes Mahima Pandey, MD  finasteride (PROSCAR) 5 MG tablet Take 5 mg by mouth daily. 03/11/13  Yes Historical Provider, MD  GARLIC PO Take 1 tablet by mouth daily.   Yes Historical Provider, MD  LACTOBACILLUS PO Take 1 tablet by mouth daily.  Yes Historical Provider, MD  loperamide (IMODIUM A-D) 2 MG tablet Take 1 tablet (2 mg total) by mouth as needed for diarrhea or loose stools. 11/15/13  Yes Lori P Hvozdovic, PA-C  Multiple Vitamins-Minerals (PRESERVISION/LUTEIN PO) Take 1 capsule by mouth 2 (two) times daily.   Yes Historical Provider, MD  Naproxen Sod-Diphenhydramine (ALEVE PM) 220-25 MG TABS Take 1 tablet by mouth at bedtime.   Yes Historical Provider, MD  omeprazole (PRILOSEC) 10 MG capsule  Take 1 capsule by mouth daily. 10/08/12  Yes Historical Provider, MD  tamsulosin (FLOMAX) 0.4 MG CAPS capsule Take 1 capsule by mouth daily. 02/05/13  Yes Historical Provider, MD  Wheat Dextrin (BENEFIBER) POWD Take 1 tablespoon by mouth in water or juice once daily 11/15/13  Yes Lori P Hvozdovic, PA-C  furosemide (LASIX) 20 MG tablet Take 1 tablet (20 mg total) by mouth daily as needed for edema. Patient not taking: Reported on 11/18/2014 10/31/14   Beatrice LecherScott T Weaver, PA-C  potassium chloride SA (K-DUR,KLOR-CON) 20 MEQ tablet Take 1 tablet (20 mEq total) by mouth daily as needed. Patient not taking: Reported on 11/18/2014 10/31/14   Beatrice LecherScott T Weaver, PA-C   BP 174/65 mmHg  Pulse 69  Temp(Src) 98.1 F (36.7 C) (Oral)  Resp 16  Ht 5\' 8"  (1.727 m)  Wt 119 lb 11.4 oz (54.3 kg)  BMI 18.21 kg/m2  SpO2 96% Physical Exam  Constitutional: He appears well-developed.  HENT:  Head: Atraumatic.  Eyes: Pupils are equal, round, and reactive to light.  Neck: Neck supple. No JVD present.  Cardiovascular: Normal rate.   Pulmonary/Chest:  Rales at bilateral bases  Abdominal: Soft. There is no tenderness.  Musculoskeletal: He exhibits no edema.  Neurological: He is alert.  Skin: Skin is warm.    ED Course  Procedures (including critical care time) Labs Review Labs Reviewed  BASIC METABOLIC PANEL - Abnormal; Notable for the following:    Glucose, Bld 111 (*)    BUN 24 (*)    All other components within normal limits  CBC - Abnormal; Notable for the following:    RBC 3.48 (*)    Hemoglobin 10.8 (*)    HCT 33.8 (*)    All other components within normal limits  BRAIN NATRIURETIC PEPTIDE - Abnormal; Notable for the following:    B Natriuretic Peptide 591.3 (*)    All other components within normal limits  PROTIME-INR - Abnormal; Notable for the following:    Prothrombin Time 15.3 (*)    All other components within normal limits  BASIC METABOLIC PANEL - Abnormal; Notable for the following:    BUN 24  (*)    All other components within normal limits  CBC - Abnormal; Notable for the following:    RBC 3.39 (*)    Hemoglobin 10.3 (*)    HCT 33.2 (*)    All other components within normal limits  GLUCOSE, CAPILLARY - Abnormal; Notable for the following:    Glucose-Capillary 158 (*)    All other components within normal limits  TROPONIN I  MAGNESIUM  PHOSPHORUS  TSH  APTT  CBC WITH DIFFERENTIAL/PLATELET    Imaging Review Dg Chest 2 View  11/18/2014  CLINICAL DATA:  Shortness of breath. EXAM: CHEST  2 VIEW COMPARISON:  10/29/2014 FINDINGS: Cardiac enlargement and aortic atherosclerosis noted. No scratch set there are bilateral pleural effusions noted right greater in left. Diffuse edema appears increased from previous exam. This is superimposed upon chronic lung disease. IMPRESSION: 1. Moderate CHF. 2.  Acute right posterior fourth rib fracture. Electronically Signed   By: Signa Kell M.D.   On: 11/18/2014 08:41   I have personally reviewed and evaluated these images and lab results as part of my medical decision-making.   EKG Interpretation   Date/Time:  Sunday November 18 2014 07:56:02 EST Ventricular Rate:  82 PR Interval:    QRS Duration: 120 QT Interval:  408 QTC Calculation: 476 R Axis:   -115 Text Interpretation:  Atrial fibrillation Ventricular premature complex  Left anterior fascicular block Anterior infarct, old Confirmed by  Masen Luallen  MD, Jame Seelig [id number]) on 11/18/2014 8:16:58 AM      MDM   Final diagnoses:  Systolic and diastolic CHF, acute on chronic [facility])     patient with apparent CHF. Hypoxic. Not on baseline oxygen. CHF on x-ray. Also has rib fracture. Has been complaining of shoulder pain but not tender on the chest. It is retro- scapular so could be there for shoulder pain. Will admit to internal medicine.    Benjiman Core, MD 11/19/14 [phone]

## 2014-11-18 NOTE — ED Notes (Signed)
Bed: [id number] Expected date: 11/18/14 Expected time: 7:43 AM Means of arrival: Ambulance Comments: DOE

## 2014-11-18 NOTE — H&P (Signed)
[facility] History and Physical  Malachi ParadiseWilliam E Riggan ZOX:[id number] DOB: 12-18-1917 DOA: 11/18/2014  Referring physician: ER physician: Dr. Benjiman CoreNathan Pickering PCP:  Duane Lopeoss, Alan, MD  Chief Complaint: shortness of breath   HPI:  81 y.o. male with past medical history of combined systolic and diastolic CHF (2 D ECHO in 2016 showed EF of 55%), atrial fibrillation (takes asa and plavix), BPH, hypertension, dyslipidemia who [facility] with worsening shortness of breath over past few days prior to this admission. His cardiologist has been adjusting the lasix dose however pt continues to be short of breath at rest and with exertion. No cough. No fevers. No palpitations or chest pain. Pt was found to be hypoxic at 88% on room air by EMS but his oxygen sat was okay in ED. In ED, pt was hemodynamically stable. Blood wokr showed hemoglobin of 10.8. CXR showed moderate CHF. BNP was 591. He was given lasix 20 mg IV. The 12 lead EKG showed atrial fibrillation. He was admitted for CHF management.   Assessment & Plan    Principal Problem:   Systolic and diastolic CHF, acute on chronic (HCC) / Acute respiratory failure with hypoxia  - Last 2 D ECHO in 06/2014 showed EF of 55%. 2 D ECHO back in 2011 showed grade 2 diastolic dysfunction - On CXR on this admission moderate CHF - Given lasix 20 mg IV in ED - Continue aspirin and plavix - Continue daily weight and strict intake and output - Replete electrolytes  - Pt not in respiratory distress - Will consult palliative for goals of care  Active Problems:   Essential hypertension - Continue Norvasc    CEREBROVASCULAR DISEASE - Continue aspirin and plavix for secondary stroke prevention    ESOPHAGEAL STRICTURE / GERD - Continue protonix      A-fib (HCC) - CHADS vasc score at least 5 (age, chf, htn, cva) - On aspirin and plavix - Rate controlled    Dyslipidemia - Continue statin therapy     Depression - Continue lexapro    Anemia of  chronic disease - Due to aspirin and plavix - Hgb stable - No indications for transfusion    BPH (benign prostatic hyperplasia) - Continue proscar and flomax  DVT prophylaxis:  - SCD's bilaterally   Radiological Exams on Admission: Dg Chest 2 View 11/18/2014 1. Moderate CHF. 2. Acute right posterior fourth rib fracture. Electronically Signed   By: Signa Kellaylor  Stroud M.D.   On: 11/18/2014 08:41    EKG: I have personally reviewed EKG. EKG shows atrial fibrillation   Code Status: Full Family Communication: Plan of care discussed with the patient and his wife at the bedside  Disposition Plan: Admit for further evaluation, telemetry   DEVINE, Aniceto BossALMA, MD  [facility] Pager [phone]  Time spent in minutes: 75 minutes  Review of Systems:  Constitutional: Negative for fever, chills and malaise/fatigue. Negative for diaphoresis.  HENT: Negative for hearing loss, ear pain, nosebleeds, congestion, sore throat, neck pain, tinnitus and ear discharge.   Eyes: Negative for blurred vision, double vision, photophobia, pain, discharge and redness.  Respiratory: Negative for cough, hemoptysis, sputum production, positive for shortness of breath, no wheezing and stridor.   Cardiovascular: per HPI Gastrointestinal: Negative for nausea, vomiting and abdominal pain. Negative for heartburn, constipation, blood in stool and melena.  Genitourinary: Negative for dysuria, urgency, frequency, hematuria and flank pain.  Musculoskeletal: Negative for myalgias, back pain, joint pain and falls.  Skin: Negative for itching and rash.  Neurological:  Negative for dizziness and weakness. Negative for tingling, tremors, sensory change, speech change, focal weakness, loss of consciousness and headaches.  Endo/Heme/Allergies: Negative for environmental allergies and polydipsia. Does not bruise/bleed easily.  Psychiatric/Behavioral: Negative for suicidal ideas. The patient is not nervous/anxious.      History  reviewed. No pertinent past medical history. Past Surgical History  Procedure Laterality Date  . Laparoscopic cholecystectomy    . Implantation of a medtronic implantation loop recorder  03/01/2006    Dr. Ladona Ridgel  . Esophagogastroduodenoscopy  01/24/2009    Esophageal Stricture--Multiple EGD's   Social History:  reports that he quit smoking about 53 years ago. He has never used smokeless tobacco. He reports that he does not drink alcohol or use illicit drugs.  No Known Allergies  Family History:  Family History  Problem Relation Age of Onset  . Coronary artery disease    . Cancer Daughter     eye cancer  . Heart attack Neg Hx   . Cancer Mother   . Cancer Sister   . Cancer Brother      Prior to Admission medications   Medication Sig Start Date End Date Taking? Authorizing Provider  ALPRAZolam (XANAX) 0.25 MG tablet Take 0.25 mg by mouth at bedtime.    Yes Historical Provider, MD  amLODipine-atorvastatin (CADUET) 5-20 MG per tablet TAKE 1 TABLET DAILY Patient taking differently: TAKE 1 TABLET BY MOUTH DAILY   Yes Lewayne Bunting, MD  Cholecalciferol (VITAMIN D) 2000 UNITS tablet Take 2,000 Units by mouth daily.   Yes Historical Provider, MD  clopidogrel (PLAVIX) 75 MG tablet Take 1 tablet (75 mg total) by mouth daily. 03/19/13  Yes Adeline Joselyn Glassman, MD  Cyanocobalamin (VITAMIN B12 PO) Take 1 tablet by mouth daily.    Yes Historical Provider, MD  escitalopram (LEXAPRO) 10 MG tablet Take 1 tablet daily by mouth for anxiety. 12/06/13  Yes Mahima Pandey, MD  finasteride (PROSCAR) 5 MG tablet Take 5 mg by mouth daily. 03/11/13  Yes Historical Provider, MD  GARLIC PO Take 1 tablet by mouth daily.   Yes Historical Provider, MD  LACTOBACILLUS PO Take 1 tablet by mouth daily.   Yes Historical Provider, MD  loperamide (IMODIUM A-D) 2 MG tablet Take 1 tablet (2 mg total) by mouth as needed for diarrhea or loose stools. 11/15/13  Yes Lori P Hvozdovic, PA-C  Multiple Vitamins-Minerals  (PRESERVISION/LUTEIN PO) Take 1 capsule by mouth 2 (two) times daily.   Yes Historical Provider, MD  Naproxen Sod-Diphenhydramine (ALEVE PM) 220-25 MG TABS Take 1 tablet by mouth at bedtime.   Yes Historical Provider, MD  omeprazole (PRILOSEC) 10 MG capsule Take 1 capsule by mouth daily. 10/08/12  Yes Historical Provider, MD  tamsulosin (FLOMAX) 0.4 MG CAPS capsule Take 1 capsule by mouth daily. 02/05/13  Yes Historical Provider, MD  Wheat Dextrin (BENEFIBER) POWD Take 1 tablespoon by mouth in water or juice once daily 11/15/13  Yes Lori P Hvozdovic, PA-C  furosemide (LASIX) 20 MG tablet Take 1 tablet (20 mg total) by mouth daily as needed for edema. Patient not taking: Reported on 11/18/2014 10/31/14   Beatrice Lecher, PA-C  potassium chloride SA (K-DUR,KLOR-CON) 20 MEQ tablet Take 1 tablet (20 mEq total) by mouth daily as needed. Patient not taking: Reported on 11/18/2014 10/31/14   Beatrice Lecher, PA-C   Physical Exam: Filed Vitals:   11/18/14 0750 11/18/14 0754 11/18/14 0842  BP:  174/91   Pulse:  81   Temp:  97.9 F (  36.6 C)   TempSrc:  Oral   Resp:  22   Weight:   55.566 kg (122 lb 8 oz)  SpO2: 96% 94%     Physical Exam  Constitutional: Appears frail, ill. No distress.  HENT: Normocephalic. No tonsillar erythema or exudates Eyes: Conjunctivae are normal. No scleral icterus.  Neck: Normal ROM. Neck supple. No tracheal deviation. No thyromegaly.  CVS: RRR, S1/S2 appreciated.  Pulmonary: sounds congested, coarse breath sounds  Abdominal: Soft. BS +,  no distension, tenderness, rebound or guarding.  Musculoskeletal: Normal range of motion. No edema and no tenderness.  Lymphadenopathy: No lymphadenopathy noted, cervical, inguinal. Neuro: Alert. Normal reflexes, muscle tone coordination. No focal neurologic deficits. Skin: Skin is warm and dry. No rash noted.  No erythema. No pallor.  Psychiatric: Normal mood and affect. Behavior, judgment, thought content normal.   Labs on Admission:   Basic Metabolic Panel:  Recent Labs Lab 11/18/14 0815  NA 140  K 5.1  CL 106  CO2 27  GLUCOSE 111*  BUN 24*  CREATININE 0.84  CALCIUM 9.0   Liver Function Tests: No results for input(s): AST, ALT, ALKPHOS, BILITOT, PROT, ALBUMIN in the last 168 hours. No results for input(s): LIPASE, AMYLASE in the last 168 hours. No results for input(s): AMMONIA in the last 168 hours. CBC:  Recent Labs Lab 11/18/14 0815  WBC 8.0  HGB 10.8*  HCT 33.8*  MCV 97.1  PLT 224   Cardiac Enzymes:  Recent Labs Lab 11/18/14 0815  TROPONINI <0.03   BNP: Invalid input(s): POCBNP CBG: No results for input(s): GLUCAP in the last 168 hours.  If 7PM-7AM, please contact night-coverage [email or web address] Password [email or web address] 11/18/2014, 9:35 AM

## 2014-11-18 NOTE — Progress Notes (Signed)
Pt admitted to 1443 from the ER. Noted on Pt's admitting assessment to left chest area there is a medium sized object under the skin. Pt and Wife state that this was a "monitering device back when he had his heart attack back in 1982 and that it is  no longer working but the Doctor left it in because it would be safer."

## 2014-11-18 NOTE — ED Notes (Signed)
Pt from home via EMS c/o dyspnea on exertion per EMS- Pt sts that he is asymptomatic at this time and only experiences SOB with exertion. Pt sts that he has had chronic diarrhea x3 months and has seen physician w/o diagnosis. Pt was found to be 88% on RA upon arrival. Pt was placed on O2 and SOB has resolved. 12 lead was unremarkable lung fields are clear. Pt is A&Ox4 and in NAD

## 2014-11-18 NOTE — ED Notes (Signed)
Pt using accessory muscles to breathe. Dr Rubin PayorPickering made aware and pt placed on 4 L O2. Pt O2 sats are 98% now and pt reports relief in breathing.

## 2014-11-18 NOTE — Progress Notes (Signed)
Per Pt's medical records there was noted that pt had a Implantation of a Medtronic Implantation loop recorder in 2008.

## 2014-11-19 DIAGNOSIS — J9601 Acute respiratory failure with hypoxia: Secondary | ICD-10-CM

## 2014-11-19 DIAGNOSIS — E785 Hyperlipidemia, unspecified: Secondary | ICD-10-CM

## 2014-11-19 LAB — CBC
HCT: 33.2 % — ABNORMAL LOW (ref 39.0–52.0)
HEMOGLOBIN: 10.3 g/dL — AB (ref 13.0–17.0)
MCH: 30.4 pg (ref 26.0–34.0)
MCHC: 31 g/dL (ref 30.0–36.0)
MCV: 97.9 fL (ref 78.0–100.0)
PLATELETS: 176 10*3/uL (ref 150–400)
RBC: 3.39 MIL/uL — AB (ref 4.22–5.81)
RDW: 15.4 % (ref 11.5–15.5)
WBC: 5 10*3/uL (ref 4.0–10.5)

## 2014-11-19 LAB — BASIC METABOLIC PANEL
ANION GAP: 5 (ref 5–15)
BUN: 24 mg/dL — ABNORMAL HIGH (ref 6–20)
CALCIUM: 8.9 mg/dL (ref 8.9–10.3)
CO2: 30 mmol/L (ref 22–32)
CREATININE: 1.01 mg/dL (ref 0.61–1.24)
Chloride: 107 mmol/L (ref 101–111)
Glucose, Bld: 96 mg/dL (ref 65–99)
Potassium: 4 mmol/L (ref 3.5–5.1)
SODIUM: 142 mmol/L (ref 135–145)

## 2014-11-19 LAB — GLUCOSE, CAPILLARY: GLUCOSE-CAPILLARY: 158 mg/dL — AB (ref 65–99)

## 2014-11-19 NOTE — Patient Outreach (Signed)
[facility] Care Management  11/19/2014  Malachi ParadiseWilliam E Zoss 27-Dec-1917 [id number]   Referral from Raiford NobleAtika Hall, RN to assign Community RN, assigned Wyatt HasteLorraine Ajel, RN to outreach for [facility] services.  Thanks, Corrie MckusickLisa O. Sharlee BlewMoore, AABA [facility] Care Management [facility] CM Assistant Phone: [phone] Fax: [phone]

## 2014-11-19 NOTE — Consult Note (Signed)
   [facility]   11/19/2014  Nathan Mills May 19, 1917 [id number]   [facility] referral received from inpatient RNCM. Came to bedside to speak with patient. [name] gave writer permission to speak with wife and caregiver who were at bedside. Explained [facility] services. Consents obtained. Explained that patient will receive post hospital discharge calls and will be evaluated for monthly home visits. [name] lives with wife and has caregiver assist, Nathan Mills for 5 days a week for 5 hours. Explained that [facility] services will not interfere or replace services provided by home health. [facility] packet and contact information given to wife, Nathan Mills. Confirmed Primary Care MD as Dr. Tenny Crawoss. Confirmed best contact number as [phone]. Will make inpatient RNCM aware of bedside visit.    Raiford NobleAtika Hall, MSN-Ed, RN,BSN [facility] Care Management Hospital Liaison [phone]

## 2014-11-19 NOTE — Progress Notes (Signed)
Initial Nutrition Assessment  DOCUMENTATION CODES:   Not applicable  INTERVENTION:  - Continue Ensure Enlive po BID, each supplement provides 350 kcal and 20 grams of protein - RD will continue to monitor for needs  NUTRITION DIAGNOSIS:   Unintentional weight loss related to chronic illness as evidenced by percent weight loss.  GOAL:   Patient will meet greater than or equal to 90% of their needs  MONITOR:   PO intake, Supplement acceptance, Weight trends, Labs, Skin, I & O's  REASON FOR ASSESSMENT:   Malnutrition Screening Tool  ASSESSMENT:   81 y.o. male with past medical history of combined systolic and diastolic CHF (2 D ECHO in 2016 showed EF of 55%), atrial fibrillation (takes asa and plavix), BPH, hypertension, dyslipidemia [facility] with worsening shortness of breath over past few days prior to this admission. His cardiologist has been adjusting the lasix dose however pt continues to be short of breath at rest and with exertion. No cough. No fevers. No palpitations or chest pain. Pt was found to be hypoxic at 88% on room air by EMS but his oxygen sat was okay in ED.  Pt seen for MST. BMI indicates borderline normal weight and underweight status. Per chart review, pt ate 100% lunch and dinner yesterday (11/6) and 75% breakfast this AM.  Noted Ensure Enlive on bedside table with 100% completion.   Pt sleeping at time of visit with no visitors/family present. Did not feel it was necessary to awake pt at this time.   Per chart review, pt has lost 5 lbs (4% body weight) in the past 3.5-4 months which is not significant for time frame. Visualized moderate muscle wasting to clavicle area but unable to complete physical assessment at this time. Continue Ensure Enlive BID.  Unsure if pt was meeting needs PTA but meeting needs since at least yesterday. Medications reviewed. Labs reviewed; BUN elevated.   Diet Order:  Diet regular Room service appropriate?: Yes; Fluid  consistency:: Thin  Skin:  Reviewed, no issues  Last BM:  11/6  Height:   Ht Readings from Last 1 Encounters:  11/18/14 5\' 8"  (1.727 m)    Weight:   Wt Readings from Last 1 Encounters:  11/19/14 119 lb 11.4 oz (54.3 kg)    Ideal Body Weight:  70 kg (kg)  BMI:  Body mass index is 18.21 kg/(m^2).  Estimated Nutritional Needs:   Kcal:  1300-1500  Protein:  50-60 grams  Fluid:  2 L/day  EDUCATION NEEDS:   No education needs identified at this time      Trenton GammonJessica Elanore Talcott, RD, LDN Inpatient Clinical Dietitian Pager # [phone] After hours/weekend pager # [phone]

## 2014-11-19 NOTE — Progress Notes (Addendum)
Patient ID: Nathan Mills, male   DOB: 1917-11-16, 81 y.o.   MRN: [id number] [facility] PROGRESS NOTE  Nathan ParadiseWilliam E Mills [id number] DOB: 1917-11-16 DOA: 11/18/2014 PCP:  Nathan Mills, Alan, MD  Brief narrative:    81 y.o. male with past medical history of combined systolic and diastolic CHF (2 D ECHO in 2016 showed EF of 55%), atrial fibrillation (takes asa and plavix), BPH, hypertension, dyslipidemia who [facility] with worsening shortness of breath over past few days prior to this admission. His cardiologist has been adjusting the lasix dose however pt continues to be short of breath at rest and with exertion.   In ED, pt was hemodynamically stable. Blood wokr showed hemoglobin of 10.8. CXR showed moderate CHF. BNP was 591. He was given lasix 20 mg IV. The 12 lead EKG showed atrial fibrillation. He was admitted for CHF management.   Anticipated discharge: Anticipated discharge 11/20/2014.  Assessment/Plan:     Principal Problem:  Systolic and diastolic CHF, acute on chronic (HCC) / Acute respiratory failure with hypoxia  - 2 D ECHO in 06/2014 showed EF of 55%. 2 D ECHO back in 2011 showed grade 2 diastolic dysfunction - Patient was found to have moderate CHF with this admissions chest x-ray. BNP was 591. - Started Lasix 20 mg IV daily - The following is the weight trend since the admission. Filed Weights   11/18/14 0842 11/18/14 1027 11/19/14 0451  Weight: 55.566 kg (122 lb 8 oz) 55.3 kg (121 lb 14.6 oz) 54.3 kg (119 lb 11.4 oz)  - Continue aspirin and Plavix - Continue daily weight and strict intake and output - Replete electrolytes as needed - Palliative care was consulted for goals of care, appreciated there input  Active Problems:  Essential hypertension - Continue Norvasc and lasix   CEREBROVASCULAR DISEASE - Continue aspirin and plavix    ESOPHAGEAL STRICTURE / GERD - Continue protonix    A-fib (HCC) - CHADS vasc score at least 5 (age, chf, htn,  cva) - Patient is on anticoagulation with aspirin and Plavix - Rate controlled without beta blocker   Dyslipidemia - Continue Lipitor 20 mg at bedtime   Depression - Continue lexapro - Patient is not depressed, he is stable   Anemia of chronic disease - Due to aspirin and plavix - Hemoglobin remains stable   BPH (benign prostatic hyperplasia) - Continue proscar and flomax  DVT prophylaxis:  - SCD's bilaterally in hospital  Code Status: Full.  Family Communication:  plan of care discussed with the patient Disposition Plan: Needs PT eval  IV access:  Peripheral IV  Procedures and diagnostic studies:    Dg Chest 2 View 11/18/2014  1. Moderate CHF. 2. Acute right posterior fourth rib fracture. Electronically Signed   By: Nathan Mills  Stroud M.D.   On: 11/18/2014 08:41    Medical Consultants:  Palliative care  Other Consultants:  PT Nutrition  IAnti-Infectives:   None     Manson PasseyEVINE, Prim [name], MD  [facility] Pager [phone]  Time spent in minutes: 25 minutes  If 7PM-7AM, please contact night-coverage [email or web address] Password [email or web address] 11/19/2014, 11:54 AM   LOS: 1 day    HPI/Subjective: No acute overnight events. Patient reports no shortness of breath.  Objective: Filed Vitals:   11/18/14 1322 11/18/14 1328 11/18/14 2205 11/19/14 0451  BP: 185/80 154/63 163/82 174/65  Pulse:  85 79 69  Temp:  97.9 F (36.6 C) 98.1 F (36.7 C) 98.1 F (36.7 C)  TempSrc:  Oral Oral Oral  Resp:  Height:      Weight:    54.3 kg (119 lb 11.4 oz)  SpO2:  98% 97% 96%    Intake/Output Summary (Last 24 hours) at 11/19/14 1154 Last data filed at 11/19/14 0941  Gross per 24 hour  Intake    723 ml  Output   1225 ml  Net   -502 ml    Exam:   General:  Pt is alert, follows commands appropriately, not in acute distress  Cardiovascular: Regular rate and rhythm, S1/S2 appreciated   Respiratory: no wheezing, no crackles, no rhonchi  Abdomen: Soft, non tender, non  distended, bowel sounds present  Extremities: No edema, pulses DP and PT palpable bilaterally  Neuro: Grossly nonfocal  Data Reviewed: Basic Metabolic Panel:  Recent Labs Lab 11/18/14 0815 11/19/14 0517  NA 140 142  K 5.1 4.0  CL 106 107  CO2 27 30  GLUCOSE 111* 96  BUN 24* 24*  CREATININE 0.84 1.01  CALCIUM 9.0 8.9  MG 2.0  --   PHOS 3.6  --    Liver Function Tests: No results for input(s): AST, ALT, ALKPHOS, BILITOT, PROT, ALBUMIN in the last 168 hours. No results for input(s): LIPASE, AMYLASE in the last 168 hours. No results for input(s): AMMONIA in the last 168 hours. CBC:  Recent Labs Lab 11/18/14 0815 11/19/14 0517  WBC 8.0 5.0  HGB 10.8* 10.3*  HCT 33.8* 33.2*  MCV 97.1 97.9  PLT 224 176   Cardiac Enzymes:  Recent Labs Lab 11/18/14 0815  TROPONINI <0.03   BNP: Invalid input(s): POCBNP CBG:  Recent Labs Lab 11/19/14 0835  GLUCAP 158*    No results found for this or any previous visit (from the past 240 hour(s)).   Scheduled Meds: . ALPRAZolam  0.25 mg Oral QHS  . amLODipine  5 mg Oral Q1200   And  . atorvastatin  20 mg Oral Q1200  . aspirin EC  81 mg Oral Daily  . cholecalciferol  2,000 Units Oral Daily  . clopidogrel  75 mg Oral Daily  . escitalopram  10 mg Oral Daily  . feeding supplement (ENSURE ENLIVE)  237 mL Oral BID BM  . finasteride  5 mg Oral Daily  . furosemide  20 mg Intravenous Daily  . Influenza vac split quadrivalent PF  0.5 mL Intramuscular Tomorrow-1000  . pantoprazole  40 mg Oral Daily  . sodium chloride  3 mL Intravenous Q12H  . tamsulosin  0.4 mg Oral Daily  . vitamin B-12  100 mcg Oral Daily   Continuous Infusions:

## 2014-11-20 ENCOUNTER — Other Ambulatory Visit: Payer: Self-pay | Admitting: *Deleted

## 2014-11-20 LAB — BASIC METABOLIC PANEL
ANION GAP: 6 (ref 5–15)
BUN: 31 mg/dL — ABNORMAL HIGH (ref 6–20)
CALCIUM: 9.1 mg/dL (ref 8.9–10.3)
CO2: 33 mmol/L — ABNORMAL HIGH (ref 22–32)
Chloride: 105 mmol/L (ref 101–111)
Creatinine, Ser: 0.94 mg/dL (ref 0.61–1.24)
GLUCOSE: 108 mg/dL — AB (ref 65–99)
POTASSIUM: 4.6 mmol/L (ref 3.5–5.1)
SODIUM: 144 mmol/L (ref 135–145)

## 2014-11-20 LAB — GLUCOSE, CAPILLARY: GLUCOSE-CAPILLARY: 97 mg/dL (ref 65–99)

## 2014-11-20 MED ORDER — POTASSIUM CHLORIDE CRYS ER 10 MEQ PO TBCR: 10.0000 meq | EXTENDED_RELEASE_TABLET | Freq: Every day | ORAL | Status: DC

## 2014-11-20 MED ORDER — ACETAMINOPHEN 325 MG PO TABS: 650.0000 mg | ORAL_TABLET | Freq: Four times a day (QID) | ORAL | Status: AC | PRN

## 2014-11-20 MED ORDER — ASPIRIN 81 MG PO TBEC: 81.0000 mg | DELAYED_RELEASE_TABLET | Freq: Every day | ORAL | Status: AC

## 2014-11-20 MED ORDER — ENSURE ENLIVE PO LIQD: 237.0000 mL | Freq: Two times a day (BID) | ORAL | Status: AC

## 2014-11-20 MED ORDER — ALPRAZOLAM 0.25 MG PO TABS: 0.2500 mg | ORAL_TABLET | Freq: Every day | ORAL | Status: DC

## 2014-11-20 MED ORDER — FUROSEMIDE 20 MG PO TABS: 20.0000 mg | ORAL_TABLET | Freq: Every day | ORAL | Status: AC

## 2014-11-20 NOTE — Discharge Instructions (Signed)

## 2014-11-20 NOTE — Progress Notes (Signed)
[facility] Both had no preference, referral given to [facility].

## 2014-11-20 NOTE — Evaluation (Signed)
Physical Therapy Evaluation Patient Details Name: Nathan ParadiseWilliam E Snelson MRN: [id number] DOB: [birth year] Today's Date: 11/20/2014   History of Present Illness  81 yo male admitted CHF. Hx of syncope, TIA, combined HF, A fib, HTN, CVA, COPD  Clinical Impression  On eval, pt required Min assist for mobility-walked ~115 feet with RW. Pt tolerated session fairly well. Needed assist to stand. Discussed d/c plan-pt refuses HHPT follow up. Pt reports he has an aide that comes out daily for a few hours.     Follow Up Recommendations Supervision - Intermittent;No PT follow up (pt refusing HHPT follow up)    Equipment Recommendations  None recommended by PT    Recommendations for Other Services       Precautions / Restrictions Precautions Precautions: Fall Restrictions Weight Bearing Restrictions: No      Mobility  Bed Mobility Overal bed mobility: Needs Assistance Bed Mobility: Supine to Sit     Supine to sit: HOB elevated;Min guard     General bed mobility comments: close guard for safety. Increased time.  Transfers Overall transfer level: Needs assistance Equipment used: Rolling walker (2 wheeled) Transfers: Sit to/from Stand Sit to Stand: Min assist         General transfer comment: Assist to rise, stabilize, control descent.   Ambulation/Gait Ambulation/Gait assistance: Min guard Ambulation Distance (Feet): 115 Feet Assistive device: Rolling walker (2 wheeled) Gait Pattern/deviations: Step-through pattern;Trunk flexed;Decreased stride length     General Gait Details: close guard for safety. Pt tends to keep RW too far forward (likely due to being accustomed to rollator)  Stairs            Wheelchair Mobility    Modified Rankin (Stroke Patients Only)       Balance Overall balance assessment: Needs assistance         Standing balance support: Bilateral upper extremity supported;During functional activity Standing balance-Leahy Scale: Poor Standing  balance comment: needs RW                             Pertinent Vitals/Pain Pain Assessment: No/denies pain    Home Living Family/patient expects to be discharged to:: Private residence Living Arrangements: Spouse/significant other Available Help at Discharge: Family;Personal care attendant (aide daily 10-3) Type of Home:  (condo) Home Access: Level entry     Home Layout: One level Home Equipment: Walker - 2 wheels;Cane - single point;Wheelchair - manual      Prior Function Level of Independence: Needs assistance   Gait / Transfers Assistance Needed: uses RW. has history of falls.   ADL's / Homemaking Assistance Needed: pt reports aide comes out 5x/week        Hand Dominance        Extremity/Trunk Assessment   Upper Extremity Assessment: Generalized weakness           Lower Extremity Assessment: Generalized weakness      Cervical / Trunk Assessment: Kyphotic  Communication   Communication: HOH  Cognition Arousal/Alertness: Awake/alert Behavior During Therapy: WFL for tasks assessed/performed Overall Cognitive Status: Within Functional Limits for tasks assessed                      General Comments      Exercises        Assessment/Plan    PT Assessment Patient needs continued PT services  PT Diagnosis Generalized weakness;Difficulty walking   PT Problem List Decreased strength;Decreased activity tolerance;Decreased balance;Decreased mobility  PT Treatment Interventions Gait training;DME instruction;Functional mobility training;Therapeutic activities;Patient/family education;Balance training;Therapeutic exercise   PT Goals (Current goals can be found in the Care Plan section) Acute Rehab PT Goals Patient Stated Goal: home PT Goal Formulation: With patient Time For Goal Achievement: 12/04/14 Potential to Achieve Goals: Fair    Frequency Min 3X/week   Barriers to discharge        Co-evaluation               End of  Session Equipment Utilized During Treatment: Gait belt Activity Tolerance: Patient tolerated treatment well Patient left: in chair;with call bell/phone within reach           Time: 0955-1016 PT Time Calculation (min) (ACUTE ONLY): 21 min   Charges:   PT Evaluation $Initial PT Evaluation Tier I: 1 Procedure     PT G Codes:        Rebeca Alert, MPT Pager: [phone]

## 2014-11-20 NOTE — Discharge Summary (Signed)
Physician Discharge Summary  Nathan Mills ZOX:[id number] DOB: 1917-09-26 DOA: 11/18/2014  PCP:  Duane Lope, MD  Admit date: 11/18/2014 Discharge date: 11/20/2014  Recommendations for Outpatient Follow-up:  1. Continue low dose lasix 20 mg daily along with potassium supplementation   Discharge Diagnoses:  Principal Problem:   Systolic and diastolic CHF, acute on chronic (HCC) Active Problems:   Acute respiratory failure with hypoxia (HCC)   Essential hypertension   CEREBROVASCULAR DISEASE   ESOPHAGEAL STRICTURE   GERD   A-fib (HCC)   Depression   Anemia of chronic disease   BPH (benign prostatic hyperplasia)   Dyslipidemia    Discharge Condition: stable; pt wants to go home   Diet recommendation: as tolerated   History of present illness:  81 y.o. male with past medical history of combined systolic and diastolic CHF (2 D ECHO in 2016 showed EF of 55%), atrial fibrillation (takes asa and plavix), BPH, hypertension, dyslipidemia who presented to [facility] ED with worsening shortness of breath over past few days prior to this admission. His cardiologist has been adjusting the lasix dose however pt continues to be short of breath at rest and with exertion.   In ED, pt was hemodynamically stable. Blood wokr showed hemoglobin of 10.8. CXR showed moderate CHF. BNP was 591. He was given lasix 20 mg IV. The 12 lead EKG showed atrial fibrillation. He was admitted for CHF management.   Hospital Course:  Assessment/Plan:     Principal Problem:  Systolic and diastolic CHF, acute on chronic (HCC) / Acute respiratory failure with hypoxia  - 2 D ECHO in 06/2014 showed EF of 55%. 2 D ECHO back in 2011 showed grade 2 diastolic dysfunction - Moderate CHF seen on admission CXR. - BNP 591 - Weight in past 48-72 hours: 55.56 kg --> 54.3 kg - Continue low dose lasix 20 mg daily  - Continue aspirin and Plavix - Palliative care was consulted for goals of care  Active Problems:  Essential  hypertension - Continue Norvasc and lasix - BP 154/53, stable   CEREBROVASCULAR DISEASE - Continue aspirin and plavix  - No evidence of bleed - Stable hemoglobin    ESOPHAGEAL STRICTURE / GERD - Continue protonix    A-fib (HCC) - CHADS vasc score at least 5 (age, chf, htn, cva) - Continue anticoagulation with aspirin and Plavix - Continue metoprolol for rate control    Dyslipidemia - Continue Lipitor 20 mg at bedtime   Depression - Stable  - Continue lexapro   Anemia of chronic disease - Likely from aspirin and plavix - Hemoglobin stable - No current indications for transfusion    BPH (benign prostatic hyperplasia) - Continue proscar and flomax  DVT prophylaxis:  - SCD's bilaterally due to risk of bleed   Code Status: Full.  Family Communication: plan of care discussed with the patient   IV access:  Peripheral IV  Procedures and diagnostic studies:   Dg Chest 2 View 11/18/2014 1. Moderate CHF. 2. Acute right posterior fourth rib fracture. Electronically Signed By: Signa Kell M.D. On: 11/18/2014 08:41    Medical Consultants:  Palliative care  Other Consultants:  PT Nutrition  IAnti-Infectives:   None      Signed:  Manson Passey, MD  [facility] 11/20/2014, 9:19 AM  Pager #: [phone]  Time spent in minutes: more than 30 minutes   Discharge Exam: Filed Vitals:   11/20/14 0658  BP: 177/91  Pulse: 83  Temp: 97.9 F (36.6 C)  Resp: 20  Filed Vitals:   11/19/14 0451 11/19/14 1414 11/19/14 2225 11/20/14 0658  BP: 174/65 121/41 154/53 177/91  Pulse: 69 63 69 83  Temp: 98.1 F (36.7 C) 97.9 F (36.6 C) 98.2 F (36.8 C) 97.9 F (36.6 C)  TempSrc: Oral Oral Oral Oral  Resp: 16 18 18 20   Height:      Weight: 54.3 kg (119 lb 11.4 oz)   53.1 kg (117 lb 1 oz)  SpO2: 96% 97% 97% 95%    General: Pt is alert, follows commands appropriately, not in acute distress Cardiovascular: Regular rate and  rhythm, S1/S2 + Respiratory: diminished but no wheezing, slightly coarse breath sounds Abdominal: Soft, non tender, non distended, bowel sounds +, no guarding Extremities: no edema, pulses palpable bilaterally DP and PT Neuro: Grossly nonfocal  Discharge Instructions  Discharge Instructions    [facility]    Complete by:  As directed   [facility] for CHF disease management and transition of care. Consents signed. Best contact number is home number as [phone]. Wife, caregiver, or daughter should be spoken to post discharge. Names are on consent. Please call with questions. Raiford NobleAtika Hall, MSN-Ed, [facility],BSN-[facility] Care Management Hospital [phone]. Thanks. Likely discharge on 11/20/14  Reason for consult:  Please assign to [facility] RN[facility]  Diagnoses of:  Heart Failure  Expected date of contact:  1-3 days (reserved for hospital discharges)     Call MD for:  difficulty breathing, headache or visual disturbances    Complete by:  As directed      Call MD for:  persistant dizziness or light-headedness    Complete by:  As directed      Call MD for:  persistant nausea and vomiting    Complete by:  As directed      Call MD for:  severe uncontrolled pain    Complete by:  As directed      Diet - low sodium heart healthy    Complete by:  As directed      Discharge instructions    Complete by:  As directed   Take Lasix 20 mg daily Take potassium 10 meq daily     Increase activity slowly    Complete by:  As directed             Medication List    TAKE these medications        acetaminophen 325 MG tablet  Commonly known as:  TYLENOL  Take 2 tablets (650 mg total) by mouth every 6 (six) hours as needed for mild pain (or Fever >/= 101).     ALEVE PM 220-25 MG Tabs  Generic drug:  Naproxen Sod-Diphenhydramine  Take 1 tablet by mouth at bedtime.     ALPRAZolam 0.25 MG tablet  Commonly known as:  XANAX  Take 1 tablet (0.25 mg total) by mouth at  bedtime.     amLODipine-atorvastatin 5-20 MG tablet  Commonly known as:  CADUET  TAKE 1 TABLET DAILY     aspirin 81 MG EC tablet  Take 1 tablet (81 mg total) by mouth daily.     BENEFIBER Powd  Take 1 tablespoon by mouth in water or juice once daily     clopidogrel 75 MG tablet  Commonly known as:  PLAVIX  Take 1 tablet (75 mg total) by mouth daily.     escitalopram 10 MG tablet  Commonly known as:  LEXAPRO  Take 1 tablet daily by mouth for anxiety.  feeding supplement (ENSURE ENLIVE) Liqd  Take 237 mLs by mouth 2 (two) times daily between meals.     finasteride 5 MG tablet  Commonly known as:  PROSCAR  Take 5 mg by mouth daily.     furosemide 20 MG tablet  Commonly known as:  LASIX  Take 1 tablet (20 mg total) by mouth daily.     GARLIC PO  Take 1 tablet by mouth daily.     LACTOBACILLUS PO  Take 1 tablet by mouth daily.     loperamide 2 MG tablet  Commonly known as:  IMODIUM A-D  Take 1 tablet (2 mg total) by mouth as needed for diarrhea or loose stools.     omeprazole 10 MG capsule  Commonly known as:  PRILOSEC  Take 1 capsule by mouth daily.     potassium chloride 10 MEQ tablet  Commonly known as:  K-DUR,KLOR-CON  Take 1 tablet (10 mEq total) by mouth daily.     PRESERVISION/LUTEIN PO  Take 1 capsule by mouth 2 (two) times daily.     tamsulosin 0.4 MG Caps capsule  Commonly known as:  FLOMAX  Take 1 capsule by mouth daily.     VITAMIN B12 PO  Take 1 tablet by mouth daily.     Vitamin D 2000 UNITS tablet  Take 2,000 Units by mouth daily.           Follow-up Information    Follow up with  Duane Lope, MD. Schedule an appointment as soon as possible for a visit in 1 week.   Specialty:  Family Medicine   Why:  Follow up appt after recent hospitalization   Contact information:   [address] [place] [address] [phone]        The results of significant diagnostics from this hospitalization (including imaging, microbiology,  ancillary and laboratory) are listed below for reference.    Significant Diagnostic Studies: Dg Chest 2 View  11/18/2014  CLINICAL DATA:  Shortness of breath. EXAM: CHEST  2 VIEW COMPARISON:  10/29/2014 FINDINGS: Cardiac enlargement and aortic atherosclerosis noted. No scratch set there are bilateral pleural effusions noted right greater in left. Diffuse edema appears increased from previous exam. This is superimposed upon chronic lung disease. IMPRESSION: 1. Moderate CHF. 2. Acute right posterior fourth rib fracture. Electronically Signed   By: Signa Kell M.D.   On: 11/18/2014 08:41   Dg Chest 2 View  10/29/2014  CLINICAL DATA:  Two day history of cough EXAM: CHEST  2 VIEW COMPARISON:  06/19/2014 FINDINGS: The heart is borderline enlarged but stable. Mild tortuosity and calcification of the thoracic aorta. The pulmonary hila appear normal and stable. Significant chronic underlying lung disease with areas of pulmonary scarring. No focal airspace consolidation or pleural effusion. Suspect emphysematous changes. A loop recorder is noted on the left side. Stable exaggerated thoracic kyphosis and thoracic compression deformities. IMPRESSION: Chronic lung disease without acute overlying pulmonary process. Electronically Signed   By: Rudie Meyer M.D.   On: 10/29/2014 14:03    Microbiology: No results found for this or any previous visit (from the past 240 hour(s)).   Labs: Basic Metabolic Panel:  Recent Labs Lab 11/18/14 0815 11/19/14 0517 11/20/14 0530  NA 140 142 144  K 5.1 4.0 4.6  CL 106 107 105  CO2 27 30 33*  GLUCOSE 111* 96 108*  BUN 24* 24* 31*  CREATININE 0.84 1.01 0.94  CALCIUM 9.0 8.9 9.1  MG 2.0  --   --  PHOS 3.6  --   --    Liver Function Tests: No results for input(s): AST, ALT, ALKPHOS, BILITOT, PROT, ALBUMIN in the last 168 hours. No results for input(s): LIPASE, AMYLASE in the last 168 hours. No results for input(s): AMMONIA in the last 168  hours. CBC:  Recent Labs Lab 11/18/14 0815 11/19/14 0517  WBC 8.0 5.0  HGB 10.8* 10.3*  HCT 33.8* 33.2*  MCV 97.1 97.9  PLT 224 176   Cardiac Enzymes:  Recent Labs Lab 11/18/14 0815  TROPONINI <0.03   BNP: BNP (last 3 results)  Recent Labs  06/17/14 1351 06/19/14 0405 11/18/14 0815  BNP 958.8* 657.0* 591.3*    ProBNP (last 3 results) No results for input(s): PROBNP in the last 8760 hours.  CBG:  Recent Labs Lab 11/19/14 0835 11/20/14 0737  GLUCAP 158* 97

## 2014-11-21 ENCOUNTER — Other Ambulatory Visit: Payer: Self-pay | Admitting: *Deleted

## 2014-11-21 NOTE — Patient Outreach (Signed)
[facility] Care Management  11/21/2014  Malachi ParadiseWilliam E Capek 28-Mar-1917 [id number]   Assessment: Transition of care call Referral received from hospital liaison Harl Favor(A. Hall) for congestive Heart Failure management and transition of care. Call placed to patient and wife Hillary Bow([name]) but unable to reach both. HIPAA compliant voice message left with name and contact number.  Plan: Will await for return call. If unable to receive a call back, will schedule patient for next outreach call.  Koa Zoeller A. Amandeep Nesmith, BSN, RN-BC [facility] Community Care Management Coordinator Cell: [phone]

## 2014-11-22 ENCOUNTER — Other Ambulatory Visit: Payer: Self-pay | Admitting: *Deleted

## 2014-11-22 NOTE — Patient Outreach (Signed)
[facility] Care Management  11/22/2014  Nathan Mills February 09, 1917 [id number]   Assessment: Transition of care call Referral received from hospital liaison Harl Favor(A. Hall) for congestive heart failure management and transition of care. Patient's wife confirms being aware of referral. 81 years old male with recent admission to the hospital (11/6-11/8) with worsening short of breath at rest and with exertion. He has diagnosis of systolic/ diastolic congestive heart failure. Transition of care call completed with patient and wife. Patient is hard of hearing but does not use hearing aides. According to wife, he walks with his walker. Caregiver manages his medications but patient is able to take his medications himself. Patient reports "doing better". Denies of shortness of breath (no use of oxygen at home), chest discomfort or swelling. Patient's wife expressed good understanding of his medications and hospital stay. Per wife report, patient has a scheduled follow-up appointment with primary care provider on Wednesday 11/28/14. Transportation is provided by patient's daughter or caregiver Darel Hong(Judy). Wife agreed to initial home visit next week.   Plan: Transition of care initial visit on 11/27/14.   Durinda Buzzelli A. Lyann Hagstrom, BSN, RN-BC [facility] Community Care Management Coordinator Cell: [phone]

## 2014-11-27 ENCOUNTER — Other Ambulatory Visit: Payer: Self-pay | Admitting: *Deleted

## 2014-11-27 NOTE — Patient Outreach (Signed)
[facility] Care Management  11/27/2014  Nathan ParadiseWilliam E Mills 1917-05-09 [id number]   Assessment: Transition of care - initial home visit Arrived at patient's home (a one-level townhouse) and patient's wife Nathan Mills(Nathan Mills) opened the door stating that husband (patient) is using the bathroom.  Wife just spoke with care management coordinator on their doorway. She politely requested that they would like to hold off this visit for now. Patient's wife admits and states that her "husband is so against it". Ms. Nathan Mills mentions to care management coordinator that patient has a follow-up visit with primary care provider tomorrow 11/16 and "will discuss with Dr. Tenny Crawoss [facility] first, before proceeding". "We will let you know what we have finally decided" per patient's wife. Encourage wife to call care management coordinator after patient's follow-up visit and was provided with name and contact number and she agreed to it. According to Ms. Nathan Mills, their daughter Nathan Mills(Nathan Mills) who is out of town will arrive this afternoon to bring patient to his doctors' appointment tomorrow.  Plan: Will await for patient's wife's call. If unable to receive a call back, will call wife and patient  to follow-up.  Nathan Mills A. Nathan Mills, BSN, RN-BC [facility] Community Care Management Coordinator Cell: [phone]

## 2014-12-07 ENCOUNTER — Other Ambulatory Visit: Payer: Self-pay | Admitting: *Deleted

## 2014-12-07 NOTE — Patient Outreach (Signed)
[facility]) Care Management  12/07/2014  Nathan Mills 1917-10-01 [id number]   Assessment: Transition of care call - follow-up Unable to receive any call back from patient or wife after follow-up appointment with primary care provider on 11/16. Call placed to patient and wife but no answer on their home phone number and no voicemail box to leave a message.   Call placed to a provided mobile number and was able to reach patient's daughter Nathan Mills(Nathan Mills) who lives out of town. She reports that patient's wife Nathan Mills(Nathan Mills) is in the hospital and unsure where patient is at this moment. Ms. Nathan Mills instructed care management coordinator to contact her sister Nathan Mills(Nathan Mills) who is in town.   Call placed to emergency contact/daughter Nathan Mills(Nathan Mills) but unable to reach her. HIPAA compliant voice message left with name and contact number.   Plan: Will await return call from patient's daughter Nathan Mills(Nathan Mills). If unable to receive a return call, will schedule for next outreach call.  Nathan Mills, BSN, RN-BC [facility] Community Care Management Coordinator Cell: [phone]

## 2014-12-10 ENCOUNTER — Encounter: Payer: Self-pay | Admitting: *Deleted

## 2014-12-10 ENCOUNTER — Other Ambulatory Visit: Payer: Self-pay | Admitting: *Deleted

## 2014-12-10 NOTE — Patient Outreach (Signed)
[facility]) Care Management  12/10/2014  Nathan ParadiseWilliam E Takaki 1917/07/16 [id number]   Assessment: Transition of care follow-up call Call placed to patient/wife and daughter in response to message left by daughter Juliette Alcide([name]) last Friday afternoon.  Spoke to patient's wife who states they would like to "cancel it all out"- [facility] since patient "does not want anybody coming and working with him".  Wife states [facility] information if needed to [facility] for future needs.   Spoke to patient's daughter and she explained that primary care provider seen patient for follow-up visit post hospitalization last 11/28/14 and was able [facility]. Family is aware of patient's eligibility to the program. Patient did not want to be bothered and family want to honor his wishes.  Per patient's daughter, Dr. Tenny Crawoss gave his recommendation not to push patient in doing this if he is being stubborn about it since they do not want to do anything that will make patient agitated. Primary care provider told them that if something changes in patient's condition that they can always ask [facility], as per daughter's report.   Plan: Will close case. Will notify primary care provider of case closure.  Railey Glad A. Nicollette Wilhelmi, BSN, RN-BC [facility] Community Care Management Coordinator Cell: [phone]

## 2014-12-25 ENCOUNTER — Encounter (HOSPITAL_COMMUNITY): Payer: Self-pay

## 2014-12-25 ENCOUNTER — Inpatient Hospital Stay (HOSPITAL_COMMUNITY)
Admission: EM | Admit: 2014-12-25 | Discharge: 2014-12-28 | DRG: 372 | Disposition: A | Discharge status: Skilled Nursing Facility | Payer: Medicare Other | Attending: Internal Medicine | Admitting: Internal Medicine

## 2014-12-25 ENCOUNTER — Inpatient Hospital Stay (HOSPITAL_COMMUNITY): Payer: Medicare Other

## 2014-12-25 DIAGNOSIS — Z87891 Personal history of nicotine dependence: Secondary | ICD-10-CM | POA: Diagnosis not present

## 2014-12-25 DIAGNOSIS — F0390 Unspecified dementia without behavioral disturbance: Secondary | ICD-10-CM | POA: Diagnosis present

## 2014-12-25 DIAGNOSIS — N390 Urinary tract infection, site not specified: Secondary | ICD-10-CM | POA: Diagnosis not present

## 2014-12-25 DIAGNOSIS — J449 Chronic obstructive pulmonary disease, unspecified: Secondary | ICD-10-CM | POA: Diagnosis present

## 2014-12-25 DIAGNOSIS — Z66 Do not resuscitate: Secondary | ICD-10-CM | POA: Diagnosis present

## 2014-12-25 DIAGNOSIS — E46 Unspecified protein-calorie malnutrition: Secondary | ICD-10-CM | POA: Diagnosis present

## 2014-12-25 DIAGNOSIS — R4182 Altered mental status, unspecified: Secondary | ICD-10-CM

## 2014-12-25 DIAGNOSIS — E860 Dehydration: Secondary | ICD-10-CM

## 2014-12-25 DIAGNOSIS — R627 Adult failure to thrive: Secondary | ICD-10-CM | POA: Diagnosis present

## 2014-12-25 DIAGNOSIS — R059 Cough, unspecified: Secondary | ICD-10-CM

## 2014-12-25 DIAGNOSIS — I4891 Unspecified atrial fibrillation: Secondary | ICD-10-CM | POA: Diagnosis present

## 2014-12-25 DIAGNOSIS — L8990 Pressure ulcer of unspecified site, unspecified stage: Secondary | ICD-10-CM | POA: Diagnosis present

## 2014-12-25 DIAGNOSIS — Z8673 Personal history of transient ischemic attack (TIA), and cerebral infarction without residual deficits: Secondary | ICD-10-CM | POA: Diagnosis not present

## 2014-12-25 DIAGNOSIS — R05 Cough: Secondary | ICD-10-CM

## 2014-12-25 DIAGNOSIS — Z7982 Long term (current) use of aspirin: Secondary | ICD-10-CM | POA: Diagnosis not present

## 2014-12-25 DIAGNOSIS — I110 Hypertensive heart disease with heart failure: Secondary | ICD-10-CM | POA: Diagnosis present

## 2014-12-25 DIAGNOSIS — Z681 Body mass index (BMI) 19 or less, adult: Secondary | ICD-10-CM | POA: Diagnosis not present

## 2014-12-25 DIAGNOSIS — Z7902 Long term (current) use of antithrombotics/antiplatelets: Secondary | ICD-10-CM | POA: Diagnosis not present

## 2014-12-25 DIAGNOSIS — A047 Enterocolitis due to Clostridium difficile: Secondary | ICD-10-CM | POA: Diagnosis not present

## 2014-12-25 DIAGNOSIS — I5032 Chronic diastolic (congestive) heart failure: Secondary | ICD-10-CM | POA: Diagnosis present

## 2014-12-25 DIAGNOSIS — R531 Weakness: Secondary | ICD-10-CM

## 2014-12-25 DIAGNOSIS — Z79899 Other long term (current) drug therapy: Secondary | ICD-10-CM

## 2014-12-25 DIAGNOSIS — E440 Moderate protein-calorie malnutrition: Secondary | ICD-10-CM | POA: Diagnosis present

## 2014-12-25 DIAGNOSIS — R197 Diarrhea, unspecified: Secondary | ICD-10-CM

## 2014-12-25 DIAGNOSIS — N3000 Acute cystitis without hematuria: Secondary | ICD-10-CM | POA: Diagnosis present

## 2014-12-25 DIAGNOSIS — E870 Hyperosmolality and hypernatremia: Secondary | ICD-10-CM | POA: Diagnosis present

## 2014-12-25 DIAGNOSIS — N179 Acute kidney failure, unspecified: Secondary | ICD-10-CM | POA: Diagnosis present

## 2014-12-25 HISTORY — DX: Unspecified atrial fibrillation: I48.91

## 2014-12-25 HISTORY — DX: Calculus of kidney: N20.0

## 2014-12-25 HISTORY — DX: Essential (primary) hypertension: I10

## 2014-12-25 HISTORY — DX: Heart failure, unspecified: I50.9

## 2014-12-25 HISTORY — DX: Urinary tract infection, site not specified: N39.0

## 2014-12-25 HISTORY — DX: Cerebral infarction, unspecified: I63.9

## 2014-12-25 HISTORY — DX: Unspecified dementia, unspecified severity, without behavioral disturbance, psychotic disturbance, mood disturbance, and anxiety: F03.90

## 2014-12-25 HISTORY — DX: Nonrheumatic mitral (valve) insufficiency: I34.0

## 2014-12-25 HISTORY — DX: Diverticulum of esophagus, acquired: K22.5

## 2014-12-25 LAB — URINE MICROSCOPIC-ADD ON

## 2014-12-25 LAB — COMPREHENSIVE METABOLIC PANEL
ALT: 11 U/L — AB (ref 17–63)
AST: 14 U/L — AB (ref 15–41)
Albumin: 3.3 g/dL — ABNORMAL LOW (ref 3.5–5.0)
Alkaline Phosphatase: 133 U/L — ABNORMAL HIGH (ref 38–126)
Anion gap: 9 (ref 5–15)
BILIRUBIN TOTAL: 1.1 mg/dL (ref 0.3–1.2)
BUN: 36 mg/dL — AB (ref 6–20)
CO2: 30 mmol/L (ref 22–32)
CREATININE: 1.28 mg/dL — AB (ref 0.61–1.24)
Calcium: 8.8 mg/dL — ABNORMAL LOW (ref 8.9–10.3)
Chloride: 110 mmol/L (ref 101–111)
GFR calc Af Amer: 53 mL/min — ABNORMAL LOW (ref >60)
GFR, EST NON AFRICAN AMERICAN: 46 mL/min — AB (ref >60)
Glucose, Bld: 110 mg/dL — ABNORMAL HIGH (ref 65–99)
POTASSIUM: 4.4 mmol/L (ref 3.5–5.1)
Sodium: 149 mmol/L — ABNORMAL HIGH (ref 135–145)
TOTAL PROTEIN: 7 g/dL (ref 6.5–8.1)

## 2014-12-25 LAB — CBC WITH DIFFERENTIAL/PLATELET
BASOS ABS: 0 10*3/uL (ref 0.0–0.1)
Basophils Relative: 0 %
Eosinophils Absolute: 0.2 10*3/uL (ref 0.0–0.7)
Eosinophils Relative: 3 %
HEMATOCRIT: 33.3 % — AB (ref 39.0–52.0)
Hemoglobin: 10.3 g/dL — ABNORMAL LOW (ref 13.0–17.0)
LYMPHS ABS: 1.1 10*3/uL (ref 0.7–4.0)
LYMPHS PCT: 16 %
MCH: 31.1 pg (ref 26.0–34.0)
MCHC: 30.9 g/dL (ref 30.0–36.0)
MCV: 100.6 fL — AB (ref 78.0–100.0)
MONO ABS: 0.5 10*3/uL (ref 0.1–1.0)
Monocytes Relative: 7 %
Neutro Abs: 5.2 10*3/uL (ref 1.7–7.7)
Neutrophils Relative %: 74 %
Platelets: 226 10*3/uL (ref 150–400)
RBC: 3.31 MIL/uL — ABNORMAL LOW (ref 4.22–5.81)
RDW: 15.8 % — AB (ref 11.5–15.5)
WBC: 7.1 10*3/uL (ref 4.0–10.5)

## 2014-12-25 LAB — URINALYSIS, ROUTINE W REFLEX MICROSCOPIC
BILIRUBIN URINE: NEGATIVE
GLUCOSE, UA: NEGATIVE mg/dL
KETONES UR: NEGATIVE mg/dL
Nitrite: NEGATIVE
PROTEIN: 30 mg/dL — AB
Specific Gravity, Urine: 1.013 (ref 1.005–1.030)
pH: 6 (ref 5.0–8.0)

## 2014-12-25 LAB — C DIFFICILE QUICK SCREEN W PCR REFLEX
C DIFFICILE (CDIFF) INTERP: POSITIVE
C Diff antigen: POSITIVE — AB
C Diff toxin: POSITIVE — AB

## 2014-12-25 LAB — TROPONIN I

## 2014-12-25 MED ORDER — ASPIRIN EC 81 MG PO TBEC
81.0000 mg | DELAYED_RELEASE_TABLET | Freq: Every day | ORAL | Status: DC
  Administered 2014-12-26 – 2014-12-28 (×3): 81 mg via ORAL
  Filled 2014-12-25 (×3): qty 1

## 2014-12-25 MED ORDER — SODIUM CHLORIDE 0.9 % IV BOLUS (SEPSIS)
1000.0000 mL | Freq: Once | INTRAVENOUS | Status: AC
  Administered 2014-12-25: 1000 mL via INTRAVENOUS

## 2014-12-25 MED ORDER — ENOXAPARIN SODIUM 30 MG/0.3ML ~~LOC~~ SOLN
30.0000 mg | Freq: Every day | SUBCUTANEOUS | Status: DC
  Administered 2014-12-25 – 2014-12-26 (×2): 30 mg via SUBCUTANEOUS
  Filled 2014-12-25 (×2): qty 0.3

## 2014-12-25 MED ORDER — DEXTROSE 5 % IV SOLN
1.0000 g | INTRAVENOUS | Status: DC
  Administered 2014-12-26 – 2014-12-27 (×2): 1 g via INTRAVENOUS
  Filled 2014-12-25 (×3): qty 10

## 2014-12-25 MED ORDER — ENSURE ENLIVE PO LIQD
237.0000 mL | Freq: Two times a day (BID) | ORAL | Status: DC
  Administered 2014-12-26 – 2014-12-28 (×4): 237 mL via ORAL

## 2014-12-25 MED ORDER — FINASTERIDE 5 MG PO TABS
5.0000 mg | ORAL_TABLET | Freq: Every day | ORAL | Status: DC
Start: 2014-12-25 — End: 2014-12-28
  Administered 2014-12-26 – 2014-12-28 (×3): 5 mg via ORAL
  Filled 2014-12-25 (×3): qty 1

## 2014-12-25 MED ORDER — DEXTROSE 5 % IV SOLN
1.0000 g | Freq: Once | INTRAVENOUS | Status: AC
  Administered 2014-12-25: 1 g via INTRAVENOUS
  Filled 2014-12-25: qty 10

## 2014-12-25 MED ORDER — TAMSULOSIN HCL 0.4 MG PO CAPS
0.4000 mg | ORAL_CAPSULE | Freq: Every day | ORAL | Status: DC
  Administered 2014-12-26 – 2014-12-28 (×3): 0.4 mg via ORAL
  Filled 2014-12-25 (×3): qty 1

## 2014-12-25 MED ORDER — ALPRAZOLAM 0.25 MG PO TABS
0.2500 mg | ORAL_TABLET | Freq: Every day | ORAL | Status: DC
  Administered 2014-12-25 – 2014-12-27 (×3): 0.25 mg via ORAL
  Filled 2014-12-25 (×3): qty 1

## 2014-12-25 MED ORDER — VANCOMYCIN 50 MG/ML ORAL SOLUTION
125.0000 mg | Freq: Four times a day (QID) | ORAL | Status: DC
  Administered 2014-12-25 – 2014-12-26 (×2): 125 mg via ORAL
  Filled 2014-12-25 (×6): qty 2.5

## 2014-12-25 MED ORDER — DEXTROSE 5 % IV SOLN
INTRAVENOUS | Status: DC
  Administered 2014-12-25 – 2014-12-26 (×2): via INTRAVENOUS

## 2014-12-25 MED ORDER — ONDANSETRON HCL 4 MG PO TABS: 4.0000 mg | ORAL_TABLET | Freq: Four times a day (QID) | ORAL | Status: DC | PRN

## 2014-12-25 MED ORDER — CLOPIDOGREL BISULFATE 75 MG PO TABS
75.0000 mg | ORAL_TABLET | Freq: Every day | ORAL | Status: DC
  Administered 2014-12-26 – 2014-12-28 (×3): 75 mg via ORAL
  Filled 2014-12-25 (×3): qty 1

## 2014-12-25 MED ORDER — AMLODIPINE BESYLATE 5 MG PO TABS
5.0000 mg | ORAL_TABLET | Freq: Every day | ORAL | Status: DC
  Administered 2014-12-26 – 2014-12-28 (×3): 5 mg via ORAL
  Filled 2014-12-25 (×3): qty 1

## 2014-12-25 MED ORDER — ONDANSETRON HCL 4 MG/2ML IJ SOLN: 4.0000 mg | Freq: Four times a day (QID) | INTRAMUSCULAR | Status: DC | PRN

## 2014-12-25 MED ORDER — SODIUM CHLORIDE 0.9 % IJ SOLN
3.0000 mL | Freq: Two times a day (BID) | INTRAMUSCULAR | Status: DC
  Administered 2014-12-27 (×2): 3 mL via INTRAVENOUS

## 2014-12-25 MED ORDER — PANTOPRAZOLE SODIUM 40 MG PO TBEC
40.0000 mg | DELAYED_RELEASE_TABLET | Freq: Every day | ORAL | Status: DC
  Administered 2014-12-26 – 2014-12-28 (×3): 40 mg via ORAL
  Filled 2014-12-25 (×3): qty 1

## 2014-12-25 MED ORDER — ACETAMINOPHEN 325 MG PO TABS: 650.0000 mg | ORAL_TABLET | Freq: Four times a day (QID) | ORAL | Status: DC | PRN

## 2014-12-25 MED ORDER — ESCITALOPRAM OXALATE 10 MG PO TABS
10.0000 mg | ORAL_TABLET | Freq: Every day | ORAL | Status: DC
  Administered 2014-12-26 – 2014-12-28 (×3): 10 mg via ORAL
  Filled 2014-12-25 (×3): qty 1

## 2014-12-25 NOTE — ED Notes (Signed)
WOULD LIKE INFORMATION ON PLACEMENT FOR THIS PT. PT IS UNABLE TO BE CARED FOR AT HOME.

## 2014-12-25 NOTE — Clinical Social Work Note (Signed)
Clinical Social Work Assessment  Patient Details  Name: Nathan Mills MRN: [id number] Date of Birth: 03-03-1917  Date of referral:  12/25/14               Reason for consult:  Facility Placement                Permission sought to share information with:   (None.) Permission granted to share information::  No  Name::        Agency::     Relationship::     Contact Information:     Housing/Transportation Living arrangements for the past 2 months:  Single Family Home (Patient lives at home with his wife in [place].) Source of Information:  Adult Children (2 daughters were present. Bolivar Haw) Patient Interpreter Needed:  None Criminal Activity/Legal Involvement Pertinent to Current Situation/Hospitalization:  No - Comment as needed Significant Relationships:  Adult Children Lives with:  Spouse Do you feel safe going back to the place where you live?  Yes (Daughters did not express that they felt unsafe with patient going home. However, they did express interest in facility,) Need for family participation in patient care:     Care giving concerns:  Patient is currently living at home with his wife in [place]. However, daughters informed CSW that the patient has not walked in 2 weeks and that they are interested in SNF for patient.   Social Worker assessment / plan:  CSW met with patient at bedside. There was no family present. Daughters were present, who confirm that the patient presents to [facility] due to weakness. Daughter stated " He just cant walk anymore, and also over the weekend he had a lot of diarrhea".   Daughter states that the patient lives at home in [place] with his wife. Also, she states that the patient has a caregiver and that they come to patient's home 5 hours a day for 5 days a week. Also, she states says that the care giver is with patient between the hours of 10am-3pm. Daughter states that patient needs assistance with completing ADL's. Also, she  states that if rehab is needed they would prefer a facility for patient.   Daughter states that patient is incontinent or urine and bowel movements. She also says that the patient has not been able to walk for 2 weeks.  Daughters states that the patient is retired and has a good support system.  Daughter Rip Harbour [phone]  Daughter/ Cecelia [phone]  Employment status:  Retired Forensic scientist:  Medicare PT Recommendations:  Not assessed at this time Information / Referral to community resources:   (CSW gave patient and family SNF list.)  Patient/Family's Response to care:  Patient appeared to be drowsy. He feel asleep during the last portion of the assessment. Daughters were present and are aware that the patient will be admitted.  Patient/Family's Understanding of and Emotional Response to Diagnosis, Current Treatment, and Prognosis:  Family is understanding. They do not have any questions for CSW at this time.  Emotional Assessment Appearance:  Appears stated age Attitude/Demeanor/Rapport:   (Appropriate. ) Affect (typically observed):  Accepting, Appropriate Orientation:  Oriented to Self, Oriented to Place, Oriented to  Time, Oriented to Situation Alcohol / Substance use:  Not Applicable Psych involvement (Current and /or in the community):  No (Comment)  Discharge Needs  Concerns to be addressed:  Adjustment to Illness Readmission within the last 30 days:  No Current discharge risk:  None Barriers to Discharge:  No Barriers Identified   Bernita Buffy, LCSW 12/25/2014, 4:02 PM

## 2014-12-25 NOTE — ED Notes (Signed)
Per [facility]- Per Family Increased confusion, diarrhea with urinary incontinence, decreased oral intake and lethargy. Also heavy breathing ? Wheezing when he breathes. HX of a-fib, fever, RR 24-28

## 2014-12-25 NOTE — Progress Notes (Signed)
CRITICAL VALUE ALERT  Critical value received:  C. Diff positive  Date of notification:  12/25/2014  Time of notification:  2126  Critical value read back:Yes.    Nurse who received alert:  Driscilla MoatsBriana Sanford Lindblad, RN  MD notified (1st page):  Merdis DelayK. Schorr, NP  Time of first page:  2129  MD notified (2nd page):  Time of second page:  Responding MD:  Merdis DelayK. Schorr, NP  Time MD responded:  2139

## 2014-12-25 NOTE — ED Notes (Signed)
Admitting MD present speaking with family

## 2014-12-25 NOTE — ED Notes (Signed)
Family states increase confusion and falls over the past week. This Clinical research associatewriter assessed old bruising to the left hip appears healing. At present, pt requesting for urinal. Pt assisted with urinal to urinate. Pt requesting for food and states has not had food since breakfast. Aware it is lunch time. Pt aware of family present. Pt aware he is hospital.

## 2014-12-25 NOTE — ED Notes (Signed)
Patient transported to X-ray 

## 2014-12-25 NOTE — H&P (Signed)
[facility] History and Physical  Nathan Mills ZOX:[id number] DOB: 14-Apr-1917 DOA: 12/25/2014  Referring physician: Dr Patria Mane.  PCP:  Duane Lope, MD   Chief Complaint: Confusion.   HPI: Nathan Mills is a 81 y.o. male with PMH significant for COPD, CHF, Stroke, dementia who presents with confusion, inability to ambulate. Patient also has chronic diarrhea. Per family diarrhea has been worse over this weekend. The patient fell  over the weekend.  Patient denies any chest pain, shortness of breath. He does relate some productive cough over the last couple of months may be worse over the last few days.  He would like to eat. Denies abdominal pain Evaluation in the ED; UA with too numerous to count WBC, sodium 149, Cr at 1.28, Alkaline phosphatase 133,    Review of Systems:  Negative, except as per HPI  Past Medical History  Diagnosis Date  . COPD (chronic obstructive pulmonary disease) (HCC)   . A-fib (HCC)   . CHF (congestive heart failure) (HCC)   . MI (mitral incompetence)   . UTI (lower urinary tract infection)   . Hypertension   . Stroke (HCC)   . Dementia   . Zenker's (hypopharyngeal) diverticulum   . Kidney stones    Past Surgical History  Procedure Laterality Date  . Laparoscopic cholecystectomy    . Implantation of a medtronic implantation loop recorder  03/01/2006    Dr. Ladona Ridgel  . Esophagogastroduodenoscopy  01/24/2009    Esophageal Stricture--Multiple EGD's  . Kidney stone surgery    . Zenker's diverticulectomy     Social History:  reports that he quit smoking about 53 years ago. He has never used smokeless tobacco. He reports that he does not drink alcohol or use illicit drugs.  No Known Allergies  Family History  Problem Relation Age of Onset  . Coronary artery disease    . Cancer Daughter     eye cancer  . Heart attack Neg Hx   . Cancer Mother   . Cancer Sister   . Cancer Brother     Prior to Admission medications   Medication Sig Start  Date End Date Taking? Authorizing Provider  acetaminophen (TYLENOL) 325 MG tablet Take 2 tablets (650 mg total) by mouth every 6 (six) hours as needed for mild pain (or Fever >/= 101). 11/20/14  Yes Alison Murray, MD  ALPRAZolam Prudy Feeler) 0.25 MG tablet Take 1 tablet (0.25 mg total) by mouth at bedtime. 11/20/14  Yes Alison Murray, MD  amLODipine-atorvastatin (CADUET) 5-20 MG per tablet TAKE 1 TABLET DAILY Patient taking differently: TAKE 1 TABLET BY MOUTH DAILY   Yes Lewayne Bunting, MD  aspirin EC 81 MG EC tablet Take 1 tablet (81 mg total) by mouth daily. 11/20/14  Yes Alison Murray, MD  Cholecalciferol (VITAMIN D) 2000 UNITS tablet Take 2,000 Units by mouth daily.   Yes Historical Provider, MD  clopidogrel (PLAVIX) 75 MG tablet Take 1 tablet (75 mg total) by mouth daily. 03/19/13  Yes Adeline Joselyn Glassman, MD  Cyanocobalamin (VITAMIN B12 PO) Take 1 tablet by mouth daily.    Yes Historical Provider, MD  escitalopram (LEXAPRO) 10 MG tablet Take 1 tablet daily by mouth for anxiety. 12/06/13  Yes Mahima Pandey, MD  feeding supplement, ENSURE ENLIVE, (ENSURE ENLIVE) LIQD Take 237 mLs by mouth 2 (two) times daily between meals. 11/20/14  Yes Alison Murray, MD  finasteride (PROSCAR) 5 MG tablet Take 5 mg by mouth daily. 03/11/13  Yes Historical Provider,  MD  furosemide (LASIX) 20 MG tablet Take 1 tablet (20 mg total) by mouth daily. 11/20/14  Yes Alison MurrayAlma M Devine, MD  GARLIC PO Take 1 tablet by mouth daily.   Yes Historical Provider, MD  LACTOBACILLUS PO Take 1 tablet by mouth daily.   Yes Historical Provider, MD  loperamide (IMODIUM A-D) 2 MG tablet Take 1 tablet (2 mg total) by mouth as needed for diarrhea or loose stools. 11/15/13  Yes Lori P Hvozdovic, PA-C  Multiple Vitamins-Minerals (PRESERVISION/LUTEIN PO) Take 1 capsule by mouth 2 (two) times daily.   Yes Historical Provider, MD  Naproxen Sod-Diphenhydramine (ALEVE PM) 220-25 MG TABS Take 1 tablet by mouth at bedtime.   Yes Historical Provider, MD  omeprazole  (PRILOSEC) 10 MG capsule Take 1 capsule by mouth daily. 10/08/12  Yes Historical Provider, MD  potassium chloride SA (K-DUR,KLOR-CON) 20 MEQ tablet Take 20 mEq by mouth daily.   Yes Historical Provider, MD  tamsulosin (FLOMAX) 0.4 MG CAPS capsule Take 1 capsule by mouth daily. 02/05/13  Yes Historical Provider, MD  Wheat Dextrin (BENEFIBER) POWD Take 1 tablespoon by mouth in water or juice once daily 11/15/13  Yes Lori P Hvozdovic, PA-C  potassium chloride SA (K-DUR,KLOR-CON) 10 MEQ tablet Take 1 tablet (10 mEq total) by mouth daily. 11/20/14   Alison MurrayAlma M Devine, MD   Physical Exam: Filed Vitals:   12/25/14 1500 12/25/14 1515 12/25/14 1530 12/25/14 1630  BP: 166/85 165/67 140/95 162/64  Pulse: 65 65 60 75  Temp:    98 F (36.7 C)  TempSrc:    Oral  Resp: 18 19 14 20   Height:    5\' 4"  (1.626 m)  Weight:    51.7 kg (113 lb 15.7 oz)  SpO2: 99% 100% 100% 98%    Wt Readings from Last 3 Encounters:  12/25/14 51.7 kg (113 lb 15.7 oz)  11/20/14 53.1 kg (117 lb 1 oz)  10/29/14 53.706 kg (118 lb 6.4 oz)    General:  Appears calm and comfortable Eyes: PERRL, normal lids, irises & conjunctiva ENT: grossly normal hearing, lips & tongue Neck: no LAD, masses or thyromegaly Cardiovascular: RRR, no m/r/g. No LE edema. Telemetry: SR, no arrhythmias  Respiratory: CTA bilaterally, no w/r/r. Normal respiratory effort. Abdomen: soft, ntnd Skin: no rash or induration seen on limited exam Musculoskeletal: grossly normal tone BUE/BLE Psychiatric: grossly normal mood and affect, speech fluent and appropriate Neurologic: grossly non-focal.          Labs on Admission:  Basic Metabolic Panel:  Recent Labs Lab 12/25/14 1224  NA 149*  K 4.4  CL 110  CO2 30  GLUCOSE 110*  BUN 36*  CREATININE 1.28*  CALCIUM 8.8*   Liver Function Tests:  Recent Labs Lab 12/25/14 1224  AST 14*  ALT 11*  ALKPHOS 133*  BILITOT 1.1  PROT 7.0  ALBUMIN 3.3*   No results for input(s): LIPASE, AMYLASE in the last  168 hours. No results for input(s): AMMONIA in the last 168 hours. CBC:  Recent Labs Lab 12/25/14 1224  WBC 7.1  NEUTROABS 5.2  HGB 10.3*  HCT 33.3*  MCV 100.6*  PLT 226   Cardiac Enzymes:  Recent Labs Lab 12/25/14 1224  TROPONINI <0.03    BNP (last 3 results)  Recent Labs  06/17/14 1351 06/19/14 0405 11/18/14 0815  BNP 958.8* 657.0* 591.3*    ProBNP (last 3 results) No results for input(s): PROBNP in the last 8760 hours.  CBG: No results for input(s): GLUCAP in the  last 168 hours.  Radiological Exams on Admission: Dg Chest 2 View  12/25/2014  CLINICAL DATA:  Increased confusion.  Cough.  Wheezing. EXAM: CHEST - 2 VIEW COMPARISON:  Two-view chest x-ray 11/18/2014 FINDINGS: Heart is enlarged. Atherosclerotic calcifications are present at the the aortic arch. There is continued increase in a diffuse interstitial pattern likely represents edema, worse on the right. Lingular airspace disease is now present. Exaggerated thoracic kyphosis and remote fractures of the mid thoracic spine are stable. IMPRESSION: 1. Progressive interstitial and airspace disease involving the lingula there is further collapse since the prior study. 2. Cardiomegaly and mild edema, suggesting congestive heart failure. Electronically Signed   By: Marin Roberts M.D.   On: 12/25/2014 15:53    EKG: Independently reviewed. A fib  Assessment/Plan Active Problems:   UTI (lower urinary tract infection)   Dehydration   Acute cystitis without hematuria   1-UTI; Patients presents with confusion. UA with too numerous to count WBC.  Follow urine culture.  Continue with IV ceftriaxone.   2-Worsening Chronic diarrhea;  Will check GI pathogen, C diff.  Could consider CT abdomen pelvis if renal function improved.   3-Hypernatremia; D 5 IV fluids. Hold lasix.   4-Chronic Heart Faure, diastolic; euvolemi. Will hold lasix.   5-Acute on chronic renal insufficiency:  IV fluids. Hold lasix.  Repeat labs in am.   6-FTT, confusion, weakness; In setting of dehydration, worsening diarrhea, UTI.  Needs PT, family unable to care of patient at home/   7-HTN; continue with Norvasc.   Code Status: DO NOT RESUSCITATE DVT Prophylaxis: Lovenox  Family Communication: Care is discussed with daughters at bedside Disposition Plan: expect 3 to 4 days inpatient.   Time spent: 75 minutes,   Regalado, Belkys A [facility] Pager [phone]

## 2014-12-25 NOTE — ED Notes (Signed)
Bed: XB28WA10 Expected date:  Expected time:  Means of arrival:  Comments: Ems-95 weakness

## 2014-12-25 NOTE — ED Provider Notes (Signed)
CSN: [id number]     Arrival date & time 12/25/14  1142 History   First MD Initiated Contact with Patient 12/25/14 1146     Chief Complaint  Patient presents with  . Weakness  . Diarrhea  . Fall  . Altered Mental Status  . Urinary Frequency     L5 caveat: Altered mental status  HPI Patient presents to the emergency department with increasing diarrhea the past several days and generalized weakness.  Family has noted some decline over the past several weeks.  He has also been more confused per family.  No reports of fever.  No reports of vomiting or diarrhea.  No new rash per family.   Past Medical History  Diagnosis Date  . COPD (chronic obstructive pulmonary disease) (HCC)   . A-fib (HCC)   . CHF (congestive heart failure) (HCC)   . MI (mitral incompetence)   . UTI (lower urinary tract infection)   . Hypertension   . Stroke (HCC)   . Dementia   . Zenker's (hypopharyngeal) diverticulum   . Kidney stones    Past Surgical History  Procedure Laterality Date  . Laparoscopic cholecystectomy    . Implantation of a medtronic implantation loop recorder  03/01/2006    Dr. Ladona Ridgelaylor  . Esophagogastroduodenoscopy  01/24/2009    Esophageal Stricture--Multiple EGD's  . Kidney stone surgery    . Zenker's diverticulectomy     Family History  Problem Relation Age of Onset  . Coronary artery disease    . Cancer Daughter     eye cancer  . Heart attack Neg Hx   . Cancer Mother   . Cancer Sister   . Cancer Brother    Social History  Substance Use Topics  . Smoking status: Former Smoker    Quit date: 05/07/1961  . Smokeless tobacco: Never Used  . Alcohol Use: No    Review of Systems  Unable to perform ROS: Mental status change      Allergies  Review of patient's allergies indicates no known allergies.  Home Medications   Prior to Admission medications   Medication Sig Start Date End Date Taking? Authorizing Provider  acetaminophen (TYLENOL) 325 MG tablet Take 2 tablets  (650 mg total) by mouth every 6 (six) hours as needed for mild pain (or Fever >/= 101). 11/20/14  Yes Alison MurrayAlma M Devine, MD  ALPRAZolam Prudy Feeler(XANAX) 0.25 MG tablet Take 1 tablet (0.25 mg total) by mouth at bedtime. 11/20/14  Yes Alison MurrayAlma M Devine, MD  amLODipine-atorvastatin (CADUET) 5-20 MG per tablet TAKE 1 TABLET DAILY Patient taking differently: TAKE 1 TABLET BY MOUTH DAILY   Yes Lewayne BuntingBrian S Crenshaw, MD  aspirin EC 81 MG EC tablet Take 1 tablet (81 mg total) by mouth daily. 11/20/14  Yes Alison MurrayAlma M Devine, MD  Cholecalciferol (VITAMIN D) 2000 UNITS tablet Take 2,000 Units by mouth daily.   Yes Historical Provider, MD  clopidogrel (PLAVIX) 75 MG tablet Take 1 tablet (75 mg total) by mouth daily. 03/19/13  Yes Adeline Joselyn Glassman Viyuoh, MD  Cyanocobalamin (VITAMIN B12 PO) Take 1 tablet by mouth daily.    Yes Historical Provider, MD  escitalopram (LEXAPRO) 10 MG tablet Take 1 tablet daily by mouth for anxiety. 12/06/13  Yes Mahima Pandey, MD  feeding supplement, ENSURE ENLIVE, (ENSURE ENLIVE) LIQD Take 237 mLs by mouth 2 (two) times daily between meals. 11/20/14  Yes Alison MurrayAlma M Devine, MD  finasteride (PROSCAR) 5 MG tablet Take 5 mg by mouth daily. 03/11/13  Yes Historical Provider, MD  furosemide (LASIX) 20 MG tablet Take 1 tablet (20 mg total) by mouth daily. 11/20/14  Yes Alison Murray, MD  GARLIC PO Take 1 tablet by mouth daily.   Yes Historical Provider, MD  LACTOBACILLUS PO Take 1 tablet by mouth daily.   Yes Historical Provider, MD  loperamide (IMODIUM A-D) 2 MG tablet Take 1 tablet (2 mg total) by mouth as needed for diarrhea or loose stools. 11/15/13  Yes Lori P Hvozdovic, PA-C  Multiple Vitamins-Minerals (PRESERVISION/LUTEIN PO) Take 1 capsule by mouth 2 (two) times daily.   Yes Historical Provider, MD  Naproxen Sod-Diphenhydramine (ALEVE PM) 220-25 MG TABS Take 1 tablet by mouth at bedtime.   Yes Historical Provider, MD  omeprazole (PRILOSEC) 10 MG capsule Take 1 capsule by mouth daily. 10/08/12  Yes Historical Provider, MD   potassium chloride SA (K-DUR,KLOR-CON) 20 MEQ tablet Take 20 mEq by mouth daily.   Yes Historical Provider, MD  tamsulosin (FLOMAX) 0.4 MG CAPS capsule Take 1 capsule by mouth daily. 02/05/13  Yes Historical Provider, MD  Wheat Dextrin (BENEFIBER) POWD Take 1 tablespoon by mouth in water or juice once daily 11/15/13  Yes Lori P Hvozdovic, PA-C  potassium chloride SA (K-DUR,KLOR-CON) 10 MEQ tablet Take 1 tablet (10 mEq total) by mouth daily. 11/20/14   Alison Murray, MD   BP 160/78 mmHg  Pulse 66  Temp(Src) 97.6 F (36.4 C) (Rectal)  Resp 19  Ht  (1.626 m)  Wt 114 lb (51.71 kg)  BMI 19.56 kg/m2  SpO2 100% Physical Exam  Constitutional: He appears well-developed and well-nourished.  HENT:  Head: Normocephalic and atraumatic.  Eyes: EOM are normal.  Neck: Normal range of motion.  Cardiovascular: Normal rate, regular rhythm, normal heart sounds and intact distal pulses.   Pulmonary/Chest: Effort normal and breath sounds normal. No respiratory distress.  Abdominal: Soft. He exhibits no distension. There is no tenderness.  Musculoskeletal: Normal range of motion.  Neurological: He is alert.  Skin: Skin is warm and dry.  Psychiatric: He has a normal mood and affect. Judgment normal.  Nursing note and vitals reviewed.   ED Course  Procedures (including critical care time) Labs Review Labs Reviewed  CBC WITH DIFFERENTIAL/PLATELET - Abnormal; Notable for the following:    RBC 3.31 (*)    Hemoglobin 10.3 (*)    HCT 33.3 (*)    MCV 100.6 (*)    RDW 15.8 (*)    All other components within normal limits  COMPREHENSIVE METABOLIC PANEL - Abnormal; Notable for the following:    Sodium 149 (*)    Glucose, Bld 110 (*)    BUN 36 (*)    Creatinine, Ser 1.28 (*)    Calcium 8.8 (*)    Albumin 3.3 (*)    AST 14 (*)    ALT 11 (*)    Alkaline Phosphatase 133 (*)    GFR calc non Af Amer 46 (*)    GFR calc Af Amer 53 (*)    All other components within normal limits  URINALYSIS, ROUTINE  W REFLEX MICROSCOPIC (NOT AT [facility]) - Abnormal; Notable for the following:    APPearance TURBID (*)    Hgb urine dipstick LARGE (*)    Protein, ur 30 (*)    Leukocytes, UA LARGE (*)    All other components within normal limits  URINE MICROSCOPIC-ADD ON - Abnormal; Notable for the following:    Squamous Epithelial / LPF 0-5 (*)    Bacteria, UA MANY (*)  All other components within normal limits  URINE CULTURE  C DIFFICILE QUICK SCREEN W PCR REFLEX  TROPONIN I  GI PATHOGEN PANEL BY PCR, STOOL    Imaging Review No results found. I have personally reviewed and evaluated these images and lab results as part of my medical decision-making.   EKG Interpretation   Date/Time:  Tuesday December 25 2014 12:02:10 EST Ventricular Rate:  81 PR Interval:    QRS Duration: 122 QT Interval:  431 QTC Calculation: 500 R Axis:   -42 Text Interpretation:  Atrial fibrillation Nonspecific IVCD with LAD  Anterior infarct, old Borderline T abnormalities, inferior leads No  significant change was found Confirmed by Dishawn Bhargava  MD, Caryn Bee ([id number]) on  12/25/2014 1:27:48 PM      MDM   Final diagnoses:  Weakness  Dehydration  Altered mental status, unspecified altered mental status type  Acute cystitis without hematuria    Patient with dehydration and acute kidney injury.  Patient's been hydrated in the emergency department.  He also appears to have urinary tract infection.  Urine culture sent.  Rocephin given.  Patient be admitted the hospital.  Admitting team: [facility]    Azalia Bilis, MD 12/25/14 [phone]

## 2014-12-25 NOTE — Progress Notes (Addendum)
Cm visited pt who was able to tell Cm he was at '[facility]", " December" "Fay RecordsObama is still the president right now" CM had to listen well to pt as he does not have top dentures intact  Pt told CM "They trying to get rid of me"  When CM inquired about this statement "my family" Cm noted a hand written note on pt bedside table stating pt with frequent urination, stools, falls and use of immodium Pt not familiar with note when Cm inquired about it Pt did confirm he had stools "five times" Pt informed Cm "I want to go home" Cm inquired about pt f/u care after d/c in November 2016  Admit date: 11/18/2014 Discharge date: 11/20/2014  Pt states "Some things I can't remember"  Pt did nod his head when Cm inquired if he had home health staff visiting him.   Pt inquired about eating Cm updated ED RN Notified [facility] staff of pt admission to telemetry

## 2014-12-25 NOTE — Progress Notes (Signed)
[facility]  ED RN notes pt interest in placement, issues with care at home [facility]  [facility] unit Cm note from last d/c

## 2014-12-25 NOTE — Progress Notes (Signed)
CSW met with patient at bedside. There was no family present. Daughters were present, who confirm that the patient presents to [facility] due to weakness. Daughter stated " He just cant walk anymore, and also over the weekend he had a lot of diarrhea".   Daughter states that the patient lives at home in [place] with his wife. Also, she states that the patient has a caregiver and that they come to patient's home 5 hours a day for 5 days a week. Also, she states says that the care giver is with patient between the hours of 10am-3pm. Daughter states that patient needs assistance with completing ADL's. Also, she states that if rehab is needed they would prefer a facility for patient.   Daughter states that patient is incontinent or urine and bowel movements. She also says that the patient has not been able to walk for 2 weeks.  Daughters states that the patient is retired and has a good support system.  Daughter Rip Harbour [phone]   Daughter/ Cecelia [phone]  Willette Brace [phone] ED CSW 12/25/2014 3:52 PM

## 2014-12-25 NOTE — ED Notes (Signed)
SOCIAL WORK PRESENT SPEAKING WITH DAUGHTERS

## 2014-12-26 DIAGNOSIS — N390 Urinary tract infection, site not specified: Secondary | ICD-10-CM

## 2014-12-26 DIAGNOSIS — N3000 Acute cystitis without hematuria: Secondary | ICD-10-CM

## 2014-12-26 DIAGNOSIS — E860 Dehydration: Secondary | ICD-10-CM

## 2014-12-26 DIAGNOSIS — L8990 Pressure ulcer of unspecified site, unspecified stage: Secondary | ICD-10-CM | POA: Insufficient documentation

## 2014-12-26 LAB — CBC
HEMATOCRIT: 30 % — AB (ref 39.0–52.0)
HEMOGLOBIN: 9.2 g/dL — AB (ref 13.0–17.0)
MCH: 30.5 pg (ref 26.0–34.0)
MCHC: 30.7 g/dL (ref 30.0–36.0)
MCV: 99.3 fL (ref 78.0–100.0)
Platelets: 203 10*3/uL (ref 150–400)
RBC: 3.02 MIL/uL — ABNORMAL LOW (ref 4.22–5.81)
RDW: 15.5 % (ref 11.5–15.5)
WBC: 6.2 10*3/uL (ref 4.0–10.5)

## 2014-12-26 LAB — BASIC METABOLIC PANEL
ANION GAP: 5 (ref 5–15)
BUN: 32 mg/dL — ABNORMAL HIGH (ref 6–20)
CO2: 30 mmol/L (ref 22–32)
Calcium: 8.6 mg/dL — ABNORMAL LOW (ref 8.9–10.3)
Chloride: 107 mmol/L (ref 101–111)
Creatinine, Ser: 1.08 mg/dL (ref 0.61–1.24)
GFR calc Af Amer: >60 mL/min (ref >60)
GFR, EST NON AFRICAN AMERICAN: 56 mL/min — AB (ref >60)
GLUCOSE: 124 mg/dL — AB (ref 65–99)
POTASSIUM: 4.6 mmol/L (ref 3.5–5.1)
Sodium: 142 mmol/L (ref 135–145)

## 2014-12-26 MED ORDER — FUROSEMIDE 40 MG PO TABS
40.0000 mg | ORAL_TABLET | Freq: Every day | ORAL | Status: DC
  Administered 2014-12-26 – 2014-12-28 (×3): 40 mg via ORAL
  Filled 2014-12-26 (×3): qty 1

## 2014-12-26 MED ORDER — METRONIDAZOLE 500 MG PO TABS
500.0000 mg | ORAL_TABLET | Freq: Three times a day (TID) | ORAL | Status: DC
  Administered 2014-12-26 – 2014-12-28 (×7): 500 mg via ORAL
  Filled 2014-12-26 (×7): qty 1

## 2014-12-26 NOTE — Progress Notes (Signed)
/ [facility] PROGRESS NOTE  Nathan Mills:[id number] DOB: 01-Jul-1917 DOA: 12/25/2014 PCP:  Duane Lopeoss, Alan, MD  Assessment/Plan: 1. UTI -Continue IV ceftriaxone -Follow-up urine culture -Stop IV fluids  2. C. Difficile colitis -On baseline of chronic diarrhea -Change oral vancomycin to Flagyl day 1 today  3. Hyponatremia -Improved, cut down IV fluids, resume oral Lasix  4. Chronic diastolic CHF -Appears euvolemic at this time,  resume  Lasix  5. History of CVA/dementia -Mentation stable -continue ASA  6. Adult failure to thrive -due to advanced age,  UTI, C. Difficile -pT OT eval, will likely need placement  DVT prophylaxis: Lovenox  Code Status: DNR Family Communication: daughter at bedside Disposition Plan: will likely need SNF pending PT OT eval    HPI/Subjective: Feels ok, diarrhea a little better  Objective: Filed Vitals:   12/25/14 2245 12/26/14 0714  BP: 184/71 183/81  Pulse: 85 88  Temp: 97.6 F (36.4 C) 97.9 F (36.6 C)  Resp: 18 18    Intake/Output Summary (Last 24 hours) at 12/26/14 1012 Last data filed at 12/26/14 0715  Gross per 24 hour  Intake 2497.5 ml  Output   1450 ml  Net 1047.5 ml   Filed Weights   12/25/14 1221 12/25/14 1630 12/26/14 0710  Weight: 51.71 kg (114 lb) 51.7 kg (113 lb 15.7 oz) 53.524 kg (118 lb)    Exam:   General: AAOx to self, place  Cardiovascular: S1S2/RRR  Respiratory: fine basilar crackles  Abdomen: soft, mildly distended, BS present  Musculoskeletal: no edema   Data Reviewed: Basic Metabolic Panel:  Recent Labs Lab 12/25/14 1224 12/26/14 0458  NA 149* 142  K 4.4 4.6  CL 110 107  CO2 30 30  GLUCOSE 110* 124*  BUN 36* 32*  CREATININE 1.28* 1.08  CALCIUM 8.8* 8.6*   Liver Function Tests:  Recent Labs Lab 12/25/14 1224  AST 14*  ALT 11*  ALKPHOS 133*  BILITOT 1.1  PROT 7.0  ALBUMIN 3.3*   No results for input(s): LIPASE, AMYLASE in the last 168 hours. No results for  input(s): AMMONIA in the last 168 hours. CBC:  Recent Labs Lab 12/25/14 1224 12/26/14 0458  WBC 7.1 6.2  NEUTROABS 5.2  --   HGB 10.3* 9.2*  HCT 33.3* 30.0*  MCV 100.6* 99.3  PLT 226 203   Cardiac Enzymes:  Recent Labs Lab 12/25/14 1224  TROPONINI <0.03   BNP (last 3 results)  Recent Labs  06/17/14 1351 06/19/14 0405 11/18/14 0815  BNP 958.8* 657.0* 591.3*    ProBNP (last 3 results) No results for input(s): PROBNP in the last 8760 hours.  CBG: No results for input(s): GLUCAP in the last 168 hours.  Recent Results (from the past 240 hour(s))  Urine culture     Status: None (Preliminary result)   Collection Time: 12/25/14  2:29 PM  Result Value Ref Range Status   Specimen Description URINE, RANDOM  Final   Special Requests NONE  Final   Culture   Final    NO GROWTH < 12 HOURS Performed at [facility]    Report Status PENDING  Incomplete  C difficile quick scan w PCR reflex     Status: Abnormal   Collection Time: 12/25/14  8:11 PM  Result Value Ref Range Status   C Diff antigen POSITIVE (A) NEGATIVE Final   C Diff toxin POSITIVE (A) NEGATIVE Final   C Diff interpretation Positive for toxigenic C. difficile  Final    Comment: CRITICAL  RESULT CALLED TO, READ BACK BY AND VERIFIED WITH: Claudette Head [id number] @ 2126 BY J SCOTTON      Studies: Dg Chest 2 View  12/25/2014  CLINICAL DATA:  Increased confusion.  Cough.  Wheezing. EXAM: CHEST - 2 VIEW COMPARISON:  Two-view chest x-ray 11/18/2014 FINDINGS: Heart is enlarged. Atherosclerotic calcifications are present at the the aortic arch. There is continued increase in a diffuse interstitial pattern likely represents edema, worse on the right. Lingular airspace disease is now present. Exaggerated thoracic kyphosis and remote fractures of the mid thoracic spine are stable. IMPRESSION: 1. Progressive interstitial and airspace disease involving the lingula there is further collapse since the prior study. 2.  Cardiomegaly and mild edema, suggesting congestive heart failure. Electronically Signed   By: Marin Roberts M.D.   On: 12/25/2014 15:53    Scheduled Meds: . ALPRAZolam  0.25 mg Oral QHS  . amLODipine  5 mg Oral Daily  . aspirin EC  81 mg Oral Daily  . cefTRIAXone (ROCEPHIN)  IV  1 g Intravenous Q24H  . clopidogrel  75 mg Oral Daily  . enoxaparin (LOVENOX) injection  30 mg Subcutaneous QHS  . escitalopram  10 mg Oral Daily  . feeding supplement (ENSURE ENLIVE)  237 mL Oral BID BM  . finasteride  5 mg Oral Daily  . furosemide  40 mg Oral Daily  . metroNIDAZOLE  500 mg Oral 3 times per day  . pantoprazole  40 mg Oral Daily  . sodium chloride  3 mL Intravenous Q12H  . tamsulosin  0.4 mg Oral Daily   Continuous Infusions:  Antibiotics Given (last 72 hours)    Date/Time Action Medication Dose   12/25/14 2321 Given   vancomycin (VANCOCIN) 50 mg/mL oral solution 125 mg 125 mg   12/26/14 0956 Given   vancomycin (VANCOCIN) 50 mg/mL oral solution 125 mg 125 mg      Active Problems:   UTI (lower urinary tract infection)   Dehydration   Acute cystitis without hematuria   Pressure ulcer    Time spent:    [facility]  [facility] Pager [phone]. If 7PM-7AM, please contact night-coverage at [email or web address], password [facility] 12/26/2014, 10:12 AM  LOS: 1 day

## 2014-12-26 NOTE — Consult Note (Signed)
   [facility] Consult   12/26/2014  Malachi ParadiseWilliam E Narang 02-21-1917 [id number] Patient's [facility].  Referral received and spoke with Selena BattenKim, inpatient ED [facility] (referral source) regarding restart of services.  She states that currently the family is now seeking rehab at a facility.  Called and spoke with Yvonne KendallMelinda [name], daughter,  [phone]. HIPPA verified.   She states that they are currently seeking a discharge to a skilled facility for rehab and feels that he is not in need [facility] services as he was too weak to manage at home at this time..  She confirms that the family has the information if they need [facility].  RNCM will sign off at this time.  For questions, please contact: Charlesetta ShanksVictoria Kelita Wallis, RN BSN CCM [facility] Lia[facility]  [phone] business mobile phone

## 2014-12-26 NOTE — [place] FL2 (Signed)
[place] MEDICAID FL2 LEVEL OF CARE SCREENING TOOL     IDENTIFICATION  Patient Name: Nathan Mills Birthdate: 1917-02-21 Sex: male Admission Date (Current Location): 12/25/2014  [facility] and [place] Number:     Facility and Address:  [facility],  [address], [place] [address]      Provider Number: [id number]  Attending Physician Name and Address:  Zannie Cove, MD  Relative Name and Phone Number:       Current Level of Care: Hospital Recommended Level of Care: Skilled Nursing Facility Prior Approval Number:    Date Approved/Denied:   PASRR Number:    Discharge Plan: SNF    Current Diagnoses: Patient Active Problem List   Diagnosis Date Noted  . UTI (lower urinary tract infection) 12/25/2014  . Dehydration 12/25/2014  . Acute cystitis without hematuria 12/25/2014  . Systolic and diastolic CHF, acute on chronic (HCC) 11/18/2014  . Depression 11/18/2014  . Anemia of chronic disease 11/18/2014  . BPH (benign prostatic hyperplasia) 11/18/2014  . Dyslipidemia 11/18/2014  . Acute respiratory failure with hypoxia (HCC) 11/18/2014  . A-fib (HCC) 05/08/2011  . CEREBROVASCULAR DISEASE 12/24/2008  . ESOPHAGEAL STRICTURE 12/24/2008  . Essential hypertension 09/29/2008  . GERD 09/29/2008    Orientation RESPIRATION BLADDER Height & Weight    Self, Time, Situation, Place  O2 (2 liters) Incontinent  (162.6 cm) 118 lbs.  BEHAVIORAL SYMPTOMS/MOOD NEUROLOGICAL BOWEL NUTRITION STATUS      Continent Diet (heart healthy fluid consistency thin)  AMBULATORY STATUS COMMUNICATION OF NEEDS Skin     Verbally PU Stage and Appropriate Care, Surgical wounds (buttocks)   PU Stage 2 Dressing: BID                   Personal Care Assistance Level of Assistance              Functional Limitations Info  Hearing   Hearing Info: Impaired      SPECIAL CARE FACTORS FREQUENCY                       Contractures      Additional Factors  Info  Code Status Code Status Info: DNR             Current Medications (12/26/2014):  This is the current hospital active medication list Current Facility-Administered Medications  Medication Dose Route Frequency Provider Last Rate Last Dose  . acetaminophen (TYLENOL) tablet 650 mg  650 mg Oral Q6H PRN Belkys A Regalado, MD      . ALPRAZolam Prudy Feeler) tablet 0.25 mg  0.25 mg Oral QHS Belkys A Regalado, MD   0.25 mg at 12/25/14 2321  . amLODipine (NORVASC) tablet 5 mg  5 mg Oral Daily Belkys A Regalado, MD   5 mg at 12/25/14 1745  . aspirin EC tablet 81 mg  81 mg Oral Daily Belkys A Regalado, MD   81 mg at 12/25/14 1833  . cefTRIAXone (ROCEPHIN) 1 g in dextrose 5 % 50 mL IVPB  1 g Intravenous Q24H Belkys A Regalado, MD      . clopidogrel (PLAVIX) tablet 75 mg  75 mg Oral Daily Belkys A Regalado, MD   75 mg at 12/26/14 0811  . dextrose 5 % solution   Intravenous Continuous Belkys A Regalado, MD 50 mL/hr at 12/25/14 1751    . enoxaparin (LOVENOX) injection 30 mg  30 mg Subcutaneous QHS Belkys A Regalado, MD   30 mg at 12/25/14 2321  .  escitalopram (LEXAPRO) tablet 10 mg  10 mg Oral Daily Belkys A Regalado, MD   10 mg at 12/25/14 1833  . feeding supplement (ENSURE ENLIVE) (ENSURE ENLIVE) liquid 237 mL  237 mL Oral BID BM Belkys A Regalado, MD   237 mL at 12/25/14 1834  . finasteride (PROSCAR) tablet 5 mg  5 mg Oral Daily Belkys A Regalado, MD   5 mg at 12/25/14 1834  . ondansetron (ZOFRAN) tablet 4 mg  4 mg Oral Q6H PRN Belkys A Regalado, MD       Or  . ondansetron (ZOFRAN) injection 4 mg  4 mg Intravenous Q6H PRN Belkys A Regalado, MD      . pantoprazole (PROTONIX) EC tablet 40 mg  40 mg Oral Daily Belkys A Regalado, MD   40 mg at 12/25/14 1834  . sodium chloride 0.9 % injection 3 mL  3 mL Intravenous Q12H Belkys A Regalado, MD   3 mL at 12/25/14 2200  . tamsulosin (FLOMAX) capsule 0.4 mg  0.4 mg Oral Daily Belkys A Regalado, MD      . vancomycin (VANCOCIN) 50 mg/mL oral solution 125 mg  125  mg Oral QID Leanne ChangKatherine P Schorr, NP   125 mg at 12/25/14 2321     Discharge Medications: Please see discharge summary for a list of discharge medications.  Relevant Imaging Results:  Relevant Lab Results:   Additional Information    Annetta MawKujawa,Kaveon Blatz G, LCSW

## 2014-12-26 NOTE — Consult Note (Signed)
WOC wound consult note Reason for Consult: High risk for skin breakdown, no pressure injuries Wound type:No wound present Pressure Ulcer POA: No Measurement: blanching erythema to sacrum measuring 3cm x 2cm  Wound ZOX:WRUEbed:None Drainage (amount, consistency, odor) none Periwound:blanching erythema Dressing procedure/placement/frequency:Allevyn sacral dressing to protect the sacrum and coccyx, timely incontinence care using our house products. Side to side positioning and use of preventive heel boots while in bed, pressure redistribution chair pad while OOB in chair. WOC nursing team will not follow, but will remain available to this patient, the nursing and medical teams.  Please re-consult if needed. Thanks, Ladona MowLaurie Johanny Segers, MSN, RN, GNP, Hans EdenCWOCN, CWON-AP, FAAN  Pager# [phone]

## 2014-12-27 LAB — CBC
HEMATOCRIT: 30.6 % — AB (ref 39.0–52.0)
Hemoglobin: 9.7 g/dL — ABNORMAL LOW (ref 13.0–17.0)
MCH: 31.1 pg (ref 26.0–34.0)
MCHC: 31.7 g/dL (ref 30.0–36.0)
MCV: 98.1 fL (ref 78.0–100.0)
Platelets: 234 10*3/uL (ref 150–400)
RBC: 3.12 MIL/uL — AB (ref 4.22–5.81)
RDW: 15.3 % (ref 11.5–15.5)
WBC: 7.2 10*3/uL (ref 4.0–10.5)

## 2014-12-27 LAB — BASIC METABOLIC PANEL
ANION GAP: 10 (ref 5–15)
BUN: 24 mg/dL — ABNORMAL HIGH (ref 6–20)
CHLORIDE: 104 mmol/L (ref 101–111)
CO2: 28 mmol/L (ref 22–32)
Calcium: 8.7 mg/dL — ABNORMAL LOW (ref 8.9–10.3)
Creatinine, Ser: 0.88 mg/dL (ref 0.61–1.24)
GFR calc Af Amer: >60 mL/min (ref >60)
GFR calc non Af Amer: >60 mL/min (ref >60)
GLUCOSE: 110 mg/dL — AB (ref 65–99)
POTASSIUM: 3.8 mmol/L (ref 3.5–5.1)
Sodium: 142 mmol/L (ref 135–145)

## 2014-12-27 LAB — URINE CULTURE

## 2014-12-27 MED ORDER — ENOXAPARIN SODIUM 40 MG/0.4ML ~~LOC~~ SOLN
40.0000 mg | Freq: Every day | SUBCUTANEOUS | Status: DC
  Administered 2014-12-27: 40 mg via SUBCUTANEOUS
  Filled 2014-12-27: qty 0.4

## 2014-12-27 NOTE — Progress Notes (Signed)
/ [facility] PROGRESS NOTE  Nathan ParadiseWilliam E Mills WUJ:[id number] DOB: 02/16/1917 DOA: 12/25/2014 PCP:  Duane Lopeoss, Alan, MD  Assessment/Plan: 1. UTI -Continue IV ceftriaxone -Urine culture-polymicorbial  2. C. Difficile colitis -On baseline of chronic diarrhea -Continue PO Flagyl day2 -diarrhea improving  3. Hyponatremia -Improved, cut down IV fluids, resumed oral Lasix  4. Chronic diastolic CHF -Appears euvolemic at this time,  Resumed PO  Lasix  5. History of CVA/dementia -Mentation stable -continue ASA  6. Adult failure to thrive -due to advanced age,  UTI, C. Difficile -pT OT eval, will likely need placement  DVT prophylaxis: Lovenox  Code Status: DNR Family Communication: none at bedside, d/w daughter yetserday Disposition Plan: will likely need SNF pending PT OT eval    HPI/Subjective: Feels ok, diarrhea a little better  Objective: Filed Vitals:   12/27/14 0532 12/27/14 1355  BP: 154/84 139/59  Pulse: 85 78  Temp: 98.2 F (36.8 C) 98.2 F (36.8 C)  Resp: 18 18    Intake/Output Summary (Last 24 hours) at 12/27/14 1527 Last data filed at 12/27/14 1307  Gross per 24 hour  Intake    700 ml  Output   2200 ml  Net  -1500 ml   Filed Weights   12/25/14 1630 12/26/14 0710 12/27/14 0532  Weight: 51.7 kg (113 lb 15.7 oz) 53.524 kg (118 lb) 55.067 kg (121 lb 6.4 oz)    Exam:   General: AAOx to self, place  Cardiovascular: S1S2/RRR  Respiratory:CTAB  Abdomen: soft, mildly distended, BS present  Musculoskeletal: no edema   Data Reviewed: Basic Metabolic Panel:  Recent Labs Lab 12/25/14 1224 12/26/14 0458 12/27/14 0459  NA 149* 142 142  K 4.4 4.6 3.8  CL 110 107 104  CO2 30 30 28   GLUCOSE 110* 124* 110*  BUN 36* 32* 24*  CREATININE 1.28* 1.08 0.88  CALCIUM 8.8* 8.6* 8.7*   Liver Function Tests:  Recent Labs Lab 12/25/14 1224  AST 14*  ALT 11*  ALKPHOS 133*  BILITOT 1.1  PROT 7.0  ALBUMIN 3.3*   No results for input(s):  LIPASE, AMYLASE in the last 168 hours. No results for input(s): AMMONIA in the last 168 hours. CBC:  Recent Labs Lab 12/25/14 1224 12/26/14 0458 12/27/14 0459  WBC 7.1 6.2 7.2  NEUTROABS 5.2  --   --   HGB 10.3* 9.2* 9.7*  HCT 33.3* 30.0* 30.6*  MCV 100.6* 99.3 98.1  PLT 226 203 234   Cardiac Enzymes:  Recent Labs Lab 12/25/14 1224  TROPONINI <0.03   BNP (last 3 results)  Recent Labs  06/17/14 1351 06/19/14 0405 11/18/14 0815  BNP 958.8* 657.0* 591.3*    ProBNP (last 3 results) No results for input(s): PROBNP in the last 8760 hours.  CBG: No results for input(s): GLUCAP in the last 168 hours.  Recent Results (from the past 240 hour(s))  Urine culture     Status: None   Collection Time: 12/25/14  2:29 PM  Result Value Ref Range Status   Specimen Description URINE, RANDOM  Final   Special Requests NONE  Final   Culture   Final    MULTIPLE SPECIES PRESENT, SUGGEST RECOLLECTION Performed at [facility]    Report Status 12/27/2014 FINAL  Final  C difficile quick scan w PCR reflex     Status: Abnormal   Collection Time: 12/25/14  8:11 PM  Result Value Ref Range Status   C Diff antigen POSITIVE (A) NEGATIVE Final   C Diff toxin POSITIVE (  A) NEGATIVE Final   C Diff interpretation Positive for toxigenic C. difficile  Final    Comment: CRITICAL RESULT CALLED TO, READ BACK BY AND VERIFIED WITHClaudette Head [id number] @ 2126 BY J SCOTTON      Studies: Dg Chest 2 View  12/25/2014  CLINICAL DATA:  Increased confusion.  Cough.  Wheezing. EXAM: CHEST - 2 VIEW COMPARISON:  Two-view chest x-ray 11/18/2014 FINDINGS: Heart is enlarged. Atherosclerotic calcifications are present at the the aortic arch. There is continued increase in a diffuse interstitial pattern likely represents edema, worse on the right. Lingular airspace disease is now present. Exaggerated thoracic kyphosis and remote fractures of the mid thoracic spine are stable. IMPRESSION: 1. Progressive  interstitial and airspace disease involving the lingula there is further collapse since the prior study. 2. Cardiomegaly and mild edema, suggesting congestive heart failure. Electronically Signed   By: Marin Roberts M.D.   On: 12/25/2014 15:53    Scheduled Meds: . ALPRAZolam  0.25 mg Oral QHS  . amLODipine  5 mg Oral Daily  . aspirin EC  81 mg Oral Daily  . cefTRIAXone (ROCEPHIN)  IV  1 g Intravenous Q24H  . clopidogrel  75 mg Oral Daily  . enoxaparin (LOVENOX) injection  40 mg Subcutaneous QHS  . escitalopram  10 mg Oral Daily  . feeding supplement (ENSURE ENLIVE)  237 mL Oral BID BM  . finasteride  5 mg Oral Daily  . furosemide  40 mg Oral Daily  . metroNIDAZOLE  500 mg Oral 3 times per day  . pantoprazole  40 mg Oral Daily  . sodium chloride  3 mL Intravenous Q12H  . tamsulosin  0.4 mg Oral Daily   Continuous Infusions:  Antibiotics Given (last 72 hours)    Date/Time Action Medication Dose Rate   12/25/14 2321 Given   vancomycin (VANCOCIN) 50 mg/mL oral solution 125 mg 125 mg    12/26/14 0956 Given   vancomycin (VANCOCIN) 50 mg/mL oral solution 125 mg 125 mg    12/26/14 1355 Given   cefTRIAXone (ROCEPHIN) 1 g in dextrose 5 % 50 mL IVPB 1 g 100 mL/hr   12/26/14 1357 Given   metroNIDAZOLE (FLAGYL) tablet 500 mg 500 mg    12/26/14 2157 Given   metroNIDAZOLE (FLAGYL) tablet 500 mg 500 mg    12/27/14 0542 Given   metroNIDAZOLE (FLAGYL) tablet 500 mg 500 mg    12/27/14 1307 Given   cefTRIAXone (ROCEPHIN) 1 g in dextrose 5 % 50 mL IVPB 1 g 100 mL/hr   12/27/14 1307 Given   metroNIDAZOLE (FLAGYL) tablet 500 mg 500 mg       Active Problems:   UTI (lower urinary tract infection)   Dehydration   Acute cystitis without hematuria   Pressure ulcer    Time spent:    [facility]  [facility] Pager [phone]. If 7PM-7AM, please contact night-coverage at [email or web address], password [facility] 12/27/2014, 3:27 PM  LOS: 2 days

## 2014-12-27 NOTE — Progress Notes (Addendum)
CSW continuing to follow.   CSW reviewed chart and PT recommending SNF.   CSW visited pt room. Pt hard of hearing and asked CSW to contact pt daughter, Juliette AlcideMelinda. CSW discussed with pt that CSW will be discussing with pt daughter [facility]. Pt stated, "okay".   CSW contacted pt daughter, Juliette AlcideMelinda via telephone. CSW introduced self and explained role. CSW discussed recommendation for SNF. Pt [facility]. Pt daughter discussed that [facility], but open [facility] in the instance that [facility].  CSW completed FL2 and initiated [facility]. [facility] stated that facility does not have a private room in which pt needs for CDIFF.   CSW will contact pt daughter this afternoon and provide additional bed offers.  CSW to continue to follow.  Addendum 4:37 pm:  CSW provided pt daughter, Juliette AlcideMelinda with SN[facility] bed offers as [facility].   CSW clarified pt daughter's questions and concerns.  Pt daughter to review options to make determination.  CSW to continue to follow.   Loletta SpecterSuzanna Kidd, MSW, LCSW Clinical Social Work [phone]

## 2014-12-27 NOTE — [place] FL2 (Signed)
[place] MEDICAID FL2 LEVEL OF CARE SCREENING TOOL     IDENTIFICATION  Patient Name: Nathan Mills Birthdate: 05/20/1917 Sex: male Admission Date (Current Location): 12/25/2014  [place]   Facility and Address:  [facility],  [address], [place] [id number]      Provider Number: [id number]  Attending Physician Name and Address:  Zannie CovePreetha Joseph, MD  Relative Name and Phone Number:       Current Level of Care: Hospital Recommended Level of Care: Skilled Nursing Facility Prior Approval Number:    Date Approved/Denied:   PASRR Number: [id number]  Discharge Plan: SNF    Current Diagnoses: Patient Active Problem List   Diagnosis Date Noted  . Pressure ulcer 12/26/2014  . UTI (lower urinary tract infection) 12/25/2014  . Dehydration 12/25/2014  . Acute cystitis without hematuria 12/25/2014  . Systolic and diastolic CHF, acute on chronic (HCC) 11/18/2014  . Depression 11/18/2014  . Anemia of chronic disease 11/18/2014  . BPH (benign prostatic hyperplasia) 11/18/2014  . Dyslipidemia 11/18/2014  . Acute respiratory failure with hypoxia (HCC) 11/18/2014  . A-fib (HCC) 05/08/2011  . CEREBROVASCULAR DISEASE 12/24/2008  . ESOPHAGEAL STRICTURE 12/24/2008  . Essential hypertension 09/29/2008  . GERD 09/29/2008    Orientation RESPIRATION BLADDER Height & Weight    Self, Time, Situation, Place  Normal Incontinent, External catheter 5\' 4"  (162.6 cm) 121 lbs.  BEHAVIORAL SYMPTOMS/MOOD NEUROLOGICAL BOWEL NUTRITION STATUS     (NONE) Continent Diet (Regular Diet)  AMBULATORY STATUS COMMUNICATION OF NEEDS Skin   Limited Assist Verbally Other (Comment) (High risk for skin breakdown, no pressure injuries, blanching erythema to sacrum measuring 3cm x 2cm treatment Allevyn sacral dressing to protect the sacrum and coccyx, timely incontinence care using our house products. Side to side positioning and use )   PU Stage 2  Dressing: Daily                   Personal Care Assistance Level of Assistance  Bathing, Feeding, Dressing Bathing Assistance: Limited assistance Feeding assistance: Limited assistance Dressing Assistance: Limited assistance     Functional Limitations Info  Sight, Hearing, Speech Sight Info: Adequate Hearing Info: Impaired Speech Info: Adequate    SPECIAL CARE FACTORS FREQUENCY  PT (By licensed PT), OT (By licensed OT)     PT Frequency: 5 x a week OT Frequency: 5 x a week            Contractures      Additional Factors Info  Code Status, Allergies, Isolation Precautions, Psychotropic Code Status Info: DNR code status Allergies Info: No Known Allergies Psychotropic Info: Xanex   Isolation Precautions Info: Enteric precautions (UV disinfection)- positive CDIFF      Current Medications (12/27/2014):  This is the current hospital active medication list Current Facility-Administered Medications  Medication Dose Route Frequency Provider Last Rate Last Dose  . acetaminophen (TYLENOL) tablet 650 mg  650 mg Oral Q6H PRN Belkys A Regalado, MD      . ALPRAZolam Prudy Feeler(XANAX) tablet 0.25 mg  0.25 mg Oral QHS Belkys A Regalado, MD   0.25 mg at 12/26/14 2157  . amLODipine (NORVASC) tablet 5 mg  5 mg Oral Daily Belkys A Regalado, MD   5 mg at 12/27/14 0935  . aspirin EC tablet 81 mg  81 mg Oral Daily Belkys A Regalado, MD   81 mg at 12/27/14 0935  . cefTRIAXone (ROCEPHIN) 1 g in dextrose 5 % 50 mL IVPB  1 g  Intravenous Q24H Belkys A Regalado, MD   1 g at 12/26/14 1355  . clopidogrel (PLAVIX) tablet 75 mg  75 mg Oral Daily Belkys A Regalado, MD   75 mg at 12/27/14 0806  . enoxaparin (LOVENOX) injection 30 mg  30 mg Subcutaneous QHS Belkys A Regalado, MD   30 mg at 12/26/14 2157  . escitalopram (LEXAPRO) tablet 10 mg  10 mg Oral Daily Belkys A Regalado, MD   10 mg at 12/27/14 0935  . feeding supplement (ENSURE ENLIVE) (ENSURE ENLIVE) liquid 237 mL  237 mL Oral BID BM Belkys A Regalado,  MD   237 mL at 12/27/14 0936  . finasteride (PROSCAR) tablet 5 mg  5 mg Oral Daily Belkys A Regalado, MD   5 mg at 12/27/14 0935  . furosemide (LASIX) tablet 40 mg  40 mg Oral Daily Zannie Cove, MD   40 mg at 12/27/14 0935  . metroNIDAZOLE (FLAGYL) tablet 500 mg  500 mg Oral 3 times per day Zannie Cove, MD   500 mg at 12/27/14 0542  . ondansetron (ZOFRAN) tablet 4 mg  4 mg Oral Q6H PRN Belkys A Regalado, MD       Or  . ondansetron (ZOFRAN) injection 4 mg  4 mg Intravenous Q6H PRN Belkys A Regalado, MD      . pantoprazole (PROTONIX) EC tablet 40 mg  40 mg Oral Daily Belkys A Regalado, MD   40 mg at 12/27/14 0935  . sodium chloride 0.9 % injection 3 mL  3 mL Intravenous Q12H Belkys A Regalado, MD   3 mL at 12/27/14 0935  . tamsulosin (FLOMAX) capsule 0.4 mg  0.4 mg Oral Daily Belkys A Regalado, MD   0.4 mg at 12/27/14 0935     Discharge Medications: Please see discharge summary for a list of discharge medications.  Relevant Imaging Results:  Relevant Lab Results:   Additional Information SSN: [id number]. Pt on isolation precautions for CDIFF.  Manilla Strieter A, LCSW

## 2014-12-27 NOTE — Evaluation (Signed)
Physical Therapy Evaluation Patient Details Name: Nathan Mills MRN: [id number] DOB: 03-Nov-1917 Today's Date: 12/27/2014   History of Present Illness  81 yo male admitted with UTI. Hx of CHF, syncope, TIA, A fib, HTN, CVA, COPD  Clinical Impression  On eval, pt required Min assist for mobility-walked ~50 feet with RW. No family present during session. Recommend ST rehab at SNF unless family feels they can manage with pt at current level.     Follow Up Recommendations SNF;Supervision/Assistance - 24 hour    Equipment Recommendations  None recommended by PT    Recommendations for Other Services       Precautions / Restrictions Precautions Precautions: Fall Restrictions Weight Bearing Restrictions: No      Mobility  Bed Mobility Overal bed mobility: Needs Assistance Bed Mobility: Supine to Sit     Supine to sit: Min guard;HOB elevated     General bed mobility comments: close guard for safety. Increased time.   Transfers Overall transfer level: Needs assistance Equipment used: Rolling walker (2 wheeled) Transfers: Sit to/from Stand Sit to Stand: Min assist         General transfer comment: assist to rise, stabilize, control descent. Pt tends to pull up on walker  Ambulation/Gait Ambulation/Gait assistance: Min assist Ambulation Distance (Feet): 50 Feet Assistive device: Rolling walker (2 wheeled) Gait Pattern/deviations: Trunk flexed;Decreased stride length;Narrow base of support     General Gait Details: assist to stabilize. slow gait speed. VCs for posture, distance from walker. Pt keeps walker too far ahead-likely due to use of rollator at baseline  Stairs            Wheelchair Mobility    Modified Rankin (Stroke Patients Only)       Balance Overall balance assessment: Needs assistance         Standing balance support: Bilateral upper extremity supported;During functional activity Standing balance-Leahy Scale: Poor Standing balance  comment: requires RW                             Pertinent Vitals/Pain Pain Assessment: No/denies pain    Home Living Family/patient expects to be discharged to:: Skilled nursing facility Living Arrangements: Spouse/significant other Available Help at Discharge: Family;Personal care attendant Type of Home:  (condo) Home Access: Level entry     Home Layout: One level Home Equipment: Walker - 4 wheels;Wheelchair - [employer] - 2 wheels      Prior Function Level of Independence: Needs assistance   Gait / Transfers Assistance Needed: uses RW. has history of falls.   ADL's / Homemaking Assistance Needed: pt reports aide comes out 5x/week  Comments: wife uses wheelchair mostly per pt     Hand Dominance        Extremity/Trunk Assessment   Upper Extremity Assessment: Generalized weakness           Lower Extremity Assessment: Generalized weakness      Cervical / Trunk Assessment: Kyphotic  Communication   Communication: HOH  Cognition Arousal/Alertness: Awake/alert Behavior During Therapy: WFL for tasks assessed/performed Overall Cognitive Status: Within Functional Limits for tasks assessed                      General Comments      Exercises        Assessment/Plan    PT Assessment Patient needs continued PT services  PT Diagnosis Difficulty walking;Generalized weakness   PT Problem List Decreased strength;Decreased activity tolerance;Decreased balance;Decreased  mobility;Decreased knowledge of use of DME  PT Treatment Interventions DME instruction;Gait training;Functional mobility training;Therapeutic activities;Patient/family education;Balance training;Therapeutic exercise   PT Goals (Current goals can be found in the Care Plan section) Acute Rehab PT Goals Patient Stated Goal: none stated PT Goal Formulation: With patient Time For Goal Achievement: 01/10/15 Potential to Achieve Goals: Fair    Frequency Min 3X/week   Barriers  to discharge        Co-evaluation               End of Session Equipment Utilized During Treatment: Gait belt Activity Tolerance: Patient tolerated treatment well Patient left: in chair;with call bell/phone within reach;with chair alarm set           Time: [id number] PT Time Calculation (min) (ACUTE ONLY): 15 min   Charges:   PT Evaluation $Initial PT Evaluation Tier I: 1 Procedure     PT G Codes:        Rebeca AlertJannie Omolara Carol, MPT Pager: [phone]

## 2014-12-27 NOTE — Clinical Social Work Placement (Signed)
   CLINICAL SOCIAL WORK PLACEMENT  NOTE  Date:  12/27/2014  Patient Details  Name: Nathan Mills MRN: [id number] Date of Birth: 03-Mar-1917  Clinical Social Work is seeking post-discharge placement for this patient at the Skilled  Nursing Facility level of care (*CSW will initial, date and re-position this form in  chart as items are completed):  Yes   Patient/family provided with [facility] Clinical Social Work Department's list of facilities offering this level of care within the geographic area requested by the patient (or if unable, by the patient's family).  Yes   Patient/family informed of their freedom to choose among providers that offer the needed level of care, that participate in Medicare, Medicaid or managed care program needed by the patient, have an available bed and are willing to accept the patient.  Yes   Patient/family informed of [facility] ownership interest in [facility] and [facility], as well as of the fact that they are under no obligation to receive care at these facilities.  PASRR submitted to EDS on       PASRR number received on       Existing PASRR number confirmed on 12/27/14     FL2 transmitted to all facilities in geographic area requested by pt/family on 12/27/14     FL2 transmitted to all facilities within larger geographic area on       Patient informed that his/her managed care company has contracts with or will negotiate with certain facilities, including the following:            Patient/family informed of bed offers received.  Patient chooses bed at       Physician recommends and patient chooses bed at      Patient to be transferred to   on  .  Patient to be transferred to facility by       Patient family notified on   of transfer.  Name of family member notified:        PHYSICIAN Please sign FL2, Please sign DNR     Additional Comment:    _______________________________________________ Orson EvaKIDD, SUZANNA A,  LCSW 12/27/2014, 12:48 PM

## 2014-12-28 DIAGNOSIS — R4182 Altered mental status, unspecified: Secondary | ICD-10-CM

## 2014-12-28 DIAGNOSIS — E46 Unspecified protein-calorie malnutrition: Secondary | ICD-10-CM | POA: Insufficient documentation

## 2014-12-28 LAB — GI PATHOGEN PANEL BY PCR, STOOL
CAMPYLOBACTER BY PCR: NOT DETECTED
Cryptosporidium by PCR: NOT DETECTED
E COLI 0157 BY PCR: NOT DETECTED
E coli (ETEC) LT/ST: NOT DETECTED
E coli (STEC): NOT DETECTED
NOROVIRUS G1/G2: NOT DETECTED
ROTAVIRUS A BY PCR: NOT DETECTED
SALMONELLA BY PCR: NOT DETECTED
SHIGELLA BY PCR: NOT DETECTED

## 2014-12-28 LAB — CBC
HEMATOCRIT: 29.1 % — AB (ref 39.0–52.0)
HEMOGLOBIN: 9.2 g/dL — AB (ref 13.0–17.0)
MCH: 31 pg (ref 26.0–34.0)
MCHC: 31.6 g/dL (ref 30.0–36.0)
MCV: 98 fL (ref 78.0–100.0)
Platelets: 226 10*3/uL (ref 150–400)
RBC: 2.97 MIL/uL — AB (ref 4.22–5.81)
RDW: 15.6 % — ABNORMAL HIGH (ref 11.5–15.5)
WBC: 5.7 10*3/uL (ref 4.0–10.5)

## 2014-12-28 LAB — BASIC METABOLIC PANEL
ANION GAP: 10 (ref 5–15)
BUN: 24 mg/dL — ABNORMAL HIGH (ref 6–20)
CHLORIDE: 105 mmol/L (ref 101–111)
CO2: 29 mmol/L (ref 22–32)
Calcium: 8.6 mg/dL — ABNORMAL LOW (ref 8.9–10.3)
Creatinine, Ser: 0.99 mg/dL (ref 0.61–1.24)
GFR calc non Af Amer: >60 mL/min (ref >60)
Glucose, Bld: 104 mg/dL — ABNORMAL HIGH (ref 65–99)
Potassium: 3.6 mmol/L (ref 3.5–5.1)
SODIUM: 144 mmol/L (ref 135–145)

## 2014-12-28 MED ORDER — ALPRAZOLAM 0.25 MG PO TABS: 0.2500 mg | ORAL_TABLET | Freq: Every day | ORAL | Status: AC

## 2014-12-28 MED ORDER — AMLODIPINE BESYLATE 5 MG PO TABS: 5.0000 mg | ORAL_TABLET | Freq: Every day | ORAL | Status: DC

## 2014-12-28 MED ORDER — METRONIDAZOLE 500 MG PO TABS: 500.0000 mg | ORAL_TABLET | Freq: Three times a day (TID) | ORAL | Status: DC

## 2014-12-28 NOTE — Discharge Summary (Addendum)
Physician Discharge Summary  Nathan Mills UJW:[id number] DOB: 12-Jun-1917 DOA: 12/25/2014  PCP:  Duane Lope, MD  Admit date: 12/25/2014 Discharge date: 12/28/2014  Time spent: 45 minutes  Recommendations for Outpatient Follow-up:  1. PCP in 1 week  Discharge Diagnoses:    Cdiff colitis   UTI (lower urinary tract infection)   Dehydration   Acute cystitis without hematuria   Pressure ulcer   Altered mental status   Protein-calorie malnutrition , moderate   DNR   Dementia   H/o CVA  Discharge Condition: stable  Diet recommendation: low sodium  Filed Weights   12/26/14 0710 12/27/14 0532 12/28/14 0500  Weight: 53.524 kg (118 lb) 55.067 kg (121 lb 6.4 oz) 52.028 kg (114 lb 11.2 oz)    History of present illness:  Chief Complaint: Confusion.  HPI: Nathan Mills is a 81 y.o. male with PMH significant for COPD, CHF, Stroke, dementia who presented with confusion, inability to ambulate. Patient also has chronic diarrhea. Per family diarrhea has been worse over this weekend. The patient fell over the weekend.  Patient denies any chest pain, shortness of breath. He does relate some productive cough over the last couple of months may be worse over the last few days.  Evaluation in the ED; UA with too numerous to count WBC, sodium 149, Cr at 1.28, Alkaline phosphatase 133,    Hospital Course:  1. Suspected UTI -Treated with IV ceftriaxone x 4 days,  -urine culture were polymicrobial, due to concurrent Cdiff,  I stopped antibiotics after today's dose  2. C. Difficile colitis -mild without fevers, leukocytosis and first episode -has baseline of chronic diarrhea -Started on PO Flagyl and diarrhea improving -continue this for 12 more days  3. Hyponatremia -Improved, cut down IV fluids, resumed oral Lasix  4. Chronic diastolic CHF -Appears euvolemic at this time, - Resumed PO Lasix and initial hydration  5. History of CVA/dementia -Mentation stable -continue  ASA  6. Adult failure to thrive -due to advanced age, UTI, C. Difficile -pT OT eval-completed, SNF recommended for rehab -family in agreement  -DNR  7. Moderate protein calorie malnutrition -supplements advised once diarrhea improves  Discharge Exam: Filed Vitals:   12/28/14 0500 12/28/14 0945  BP: 183/81 142/71  Pulse: 79   Temp: 97.9 F (36.6 C)   Resp: 18     General: AAOx pleasantly confused, cachectic, elderly Cardiovascular: S1S2/RRR Respiratory: CTAB  Discharge Instructions   Discharge Instructions    Diet - low sodium heart healthy    Complete by:  As directed      Increase activity slowly    Complete by:  As directed           Current Discharge Medication List    START taking these medications   Details  metroNIDAZOLE (FLAGYL) 500 MG tablet Take 1 tablet (500 mg total) by mouth every 8 (eight) hours. For 12days      CONTINUE these medications which have CHANGED   Details  ALPRAZolam (XANAX) 0.25 MG tablet Take 1 tablet (0.25 mg total) by mouth at bedtime. Qty: 20 tablet, Refills: 0      CONTINUE these medications which have NOT CHANGED   Details  acetaminophen (TYLENOL) 325 MG tablet Take 2 tablets (650 mg total) by mouth every 6 (six) hours as needed for mild pain (or Fever >/= 101). Qty: 30 tablet, Refills: 0    amLODipine-atorvastatin (CADUET) 5-20 MG per tablet TAKE 1 TABLET DAILY Qty: 90 tablet, Refills: 1    aspirin  EC 81 MG EC tablet Take 1 tablet (81 mg total) by mouth daily. Qty: 30 tablet, Refills: 0    Cholecalciferol (VITAMIN D) 2000 UNITS tablet Take 2,000 Units by mouth daily.    clopidogrel (PLAVIX) 75 MG tablet Take 1 tablet (75 mg total) by mouth daily. Qty: 90 tablet, Refills: 3    Cyanocobalamin (VITAMIN B12 PO) Take 1 tablet by mouth daily.     escitalopram (LEXAPRO) 10 MG tablet Take 1 tablet daily by mouth for anxiety. Qty: 30 tablet, Refills: 1   Associated Diagnoses: Anxiety disorder, unspecified anxiety disorder  type    feeding supplement, ENSURE ENLIVE, (ENSURE ENLIVE) LIQD Take 237 mLs by mouth 2 (two) times daily between meals. Qty: 237 mL, Refills: 12    finasteride (PROSCAR) 5 MG tablet Take 5 mg by mouth daily.    furosemide (LASIX) 20 MG tablet Take 1 tablet (20 mg total) by mouth daily. Qty: 30 tablet, Refills: 0   Associated Diagnoses: Acute on chronic diastolic CHF (congestive heart failure), NYHA class 2 (HCC)    GARLIC PO Take 1 tablet by mouth daily.    LACTOBACILLUS PO Take 1 tablet by mouth daily.    loperamide (IMODIUM A-D) 2 MG tablet Take 1 tablet (2 mg total) by mouth as needed for diarrhea or loose stools. Qty: 30 tablet, Refills: 0   Associated Diagnoses: Diarrhea; Protein-calorie malnutrition (HCC)    Multiple Vitamins-Minerals (PRESERVISION/LUTEIN PO) Take 1 capsule by mouth 2 (two) times daily.    omeprazole (PRILOSEC) 10 MG capsule Take 1 capsule by mouth daily.    potassium chloride SA (K-DUR,KLOR-CON) 20 MEQ tablet Take 20 mEq by mouth daily.    tamsulosin (FLOMAX) 0.4 MG CAPS capsule Take 1 capsule by mouth daily.    Wheat Dextrin (BENEFIBER) POWD Take 1 tablespoon by mouth in water or juice once daily Refills: 0   Associated Diagnoses: Diarrhea; Protein-calorie malnutrition (HCC)      STOP taking these medications     Naproxen Sod-Diphenhydramine (ALEVE PM) 220-25 MG TABS        No Known Allergies Follow-up Information    Follow up with  Duane Lope, MD. Schedule an appointment as soon as possible for a visit in 1 week.   Specialty:  Family Medicine   Contact information:   [address] [place] [address] [phone]        The results of significant diagnostics from this hospitalization (including imaging, microbiology, ancillary and laboratory) are listed below for reference.    Significant Diagnostic Studies: Dg Chest 2 View  12/25/2014  CLINICAL DATA:  Increased confusion.  Cough.  Wheezing. EXAM: CHEST - 2 VIEW COMPARISON:   Two-view chest x-ray 11/18/2014 FINDINGS: Heart is enlarged. Atherosclerotic calcifications are present at the the aortic arch. There is continued increase in a diffuse interstitial pattern likely represents edema, worse on the right. Lingular airspace disease is now present. Exaggerated thoracic kyphosis and remote fractures of the mid thoracic spine are stable. IMPRESSION: 1. Progressive interstitial and airspace disease involving the lingula there is further collapse since the prior study. 2. Cardiomegaly and mild edema, suggesting congestive heart failure. Electronically Signed   By: Marin Roberts M.D.   On: 12/25/2014 15:53    Microbiology: Recent Results (from the past 240 hour(s))  Urine culture     Status: None   Collection Time: 12/25/14  2:29 PM  Result Value Ref Range Status   Specimen Description URINE, RANDOM  Final   Special Requests NONE  Final   Culture   Final    MULTIPLE SPECIES PRESENT, SUGGEST RECOLLECTION Performed at [facility]    Report Status 12/27/2014 FINAL  Final  C difficile quick scan w PCR reflex     Status: Abnormal   Collection Time: 12/25/14  8:11 PM  Result Value Ref Range Status   C Diff antigen POSITIVE (A) NEGATIVE Final   C Diff toxin POSITIVE (A) NEGATIVE Final   C Diff interpretation Positive for toxigenic C. difficile  Final    Comment: CRITICAL RESULT CALLED TO, READ BACK BY AND VERIFIED WITHClaudette Head: BRIANA HOLT,RN [id number] @ 2126 BY J SCOTTON      Labs: Basic Metabolic Panel:  Recent Labs Lab 12/25/14 1224 12/26/14 0458 12/27/14 0459 12/28/14 0448  NA 149* 142 142 144  K 4.4 4.6 3.8 3.6  CL 110 107 104 105  CO2 30 30 28 29   GLUCOSE 110* 124* 110* 104*  BUN 36* 32* 24* 24*  CREATININE 1.28* 1.08 0.88 0.99  CALCIUM 8.8* 8.6* 8.7* 8.6*   Liver Function Tests:  Recent Labs Lab 12/25/14 1224  AST 14*  ALT 11*  ALKPHOS 133*  BILITOT 1.1  PROT 7.0  ALBUMIN 3.3*   No results for input(s): LIPASE, AMYLASE in the last 168  hours. No results for input(s): AMMONIA in the last 168 hours. CBC:  Recent Labs Lab 12/25/14 1224 12/26/14 0458 12/27/14 0459 12/28/14 0448  WBC 7.1 6.2 7.2 5.7  NEUTROABS 5.2  --   --   --   HGB 10.3* 9.2* 9.7* 9.2*  HCT 33.3* 30.0* 30.6* 29.1*  MCV 100.6* 99.3 98.1 98.0  PLT 226 203 234 226   Cardiac Enzymes:  Recent Labs Lab 12/25/14 1224  TROPONINI <0.03   BNP: BNP (last 3 results)  Recent Labs  06/17/14 1351 06/19/14 0405 11/18/14 0815  BNP 958.8* 657.0* 591.3*    ProBNP (last 3 results) No results for input(s): PROBNP in the last 8760 hours.  CBG: No results for input(s): GLUCAP in the last 168 hours.     SignedZannie Cove:  Merial Moritz  [facility] 12/28/2014, 12:15 PM

## 2014-12-28 NOTE — Care Management Important Message (Signed)
Important Message  Patient Details IM Letter given to Cookie/Case Manager to present to PatientImportant Message  Patient Details  Name: Nathan Mills MRN: [id number] Date of Birth: Jul 05, 1917   Medicare Important Message Given:  Yes    Haskell FlirtJamison, Angalina Ante 12/28/2014, 9:01 AM Name: Nathan Mills MRN: [id number] Date of Birth: Jul 05, 1917   Medicare Important Message Given:  Yes    Haskell FlirtJamison, Layne Lebon 12/28/2014, 9:01 AM

## 2015-01-01 ENCOUNTER — Non-Acute Institutional Stay (SKILLED_NURSING_FACILITY): Payer: Medicare Other | Admitting: Internal Medicine

## 2015-01-01 ENCOUNTER — Encounter: Payer: Self-pay | Admitting: Internal Medicine

## 2015-01-01 DIAGNOSIS — F0390 Unspecified dementia without behavioral disturbance: Secondary | ICD-10-CM | POA: Diagnosis not present

## 2015-01-01 DIAGNOSIS — I5032 Chronic diastolic (congestive) heart failure: Secondary | ICD-10-CM | POA: Diagnosis not present

## 2015-01-01 DIAGNOSIS — R627 Adult failure to thrive: Secondary | ICD-10-CM | POA: Insufficient documentation

## 2015-01-01 DIAGNOSIS — I679 Cerebrovascular disease, unspecified: Secondary | ICD-10-CM | POA: Diagnosis not present

## 2015-01-01 DIAGNOSIS — N390 Urinary tract infection, site not specified: Secondary | ICD-10-CM

## 2015-01-01 DIAGNOSIS — A047 Enterocolitis due to Clostridium difficile: Secondary | ICD-10-CM

## 2015-01-01 DIAGNOSIS — F419 Anxiety disorder, unspecified: Secondary | ICD-10-CM | POA: Diagnosis not present

## 2015-01-01 DIAGNOSIS — A0472 Enterocolitis due to Clostridium difficile, not specified as recurrent: Secondary | ICD-10-CM | POA: Insufficient documentation

## 2015-01-01 NOTE — Assessment & Plan Note (Signed)
SNF -reported stable; cont ASA as prophylaxis and plavix

## 2015-01-01 NOTE — Assessment & Plan Note (Signed)
SNF - reported stable ;on no meds

## 2015-01-01 NOTE — Assessment & Plan Note (Signed)
SNF - due to advanced age, UTI, C. Difficile -pT OT eval-completed, SNF recommended for rehab -family in agreement  -DNR

## 2015-01-01 NOTE — Assessment & Plan Note (Signed)
SNF - chronic and stable - cont lexapro and xanax

## 2015-01-01 NOTE — Assessment & Plan Note (Signed)
-  mild without fevers, leukocytosis and first episode -has baseline of chronic diarrhea -Started on PO Flagyl and diarrhea improving SNF - continue flagyl  for 12 more days

## 2015-01-01 NOTE — Assessment & Plan Note (Signed)
SNF - reported euvolemic; cont lasix 20 mg

## 2015-01-01 NOTE — Progress Notes (Signed)
MRN: [id number] Name: Nathan Mills  Sex: male Age: 81 y.o. DOB: 26-Mar-1917  [facility]: [name] Facility/Room:105 Level Of Care: SNF Provider: Merrilee Seashore D Emergency Contacts: Extended Emergency Contact Information Primary Emergency Contact: [name] Address: [address] [place], [place] Home Phone: [phone] Relation: Spouse Secondary Emergency Contact: [name],[name] Address: [address]          [place], [place] [address] [place] Phone: [phone] Relation: Daughter  Code Status:   Allergies: Review of patient's allergies indicates no known allergies.  Chief Complaint  Patient presents with  . New Admit To SNF    HPI: Patient is 81 y.o. male with COPD, CHF, Stroke, dementia who was admitted to [facility] from 12/13-16 for confusion and diarrhea where he was dx with and treated for C difficile and hyponatremia. Pt is admitted to SNF for generalized weakness for OT/PT. While at SNF pt will be followed for CHF, tx with lasix, s/p CVA tx with ASA , plavix and statin and anxiety tx with xanax and lexapro.  Past Medical History  Diagnosis Date  . COPD (chronic obstructive pulmonary disease) (HCC)   . A-fib (HCC)   . CHF (congestive heart failure) (HCC)   . MI (mitral incompetence)   . UTI (lower urinary tract infection)   . Hypertension   . Stroke (HCC)   . Dementia   . Zenker's (hypopharyngeal) diverticulum   . Kidney stones     Past Surgical History  Procedure Laterality Date  . Laparoscopic cholecystectomy    . Implantation of a medtronic implantation loop recorder  03/01/2006    Dr. Ladona Ridgel  . Esophagogastroduodenoscopy  01/24/2009    Esophageal Stricture--Multiple EGD's  . Kidney stone surgery    . Zenker's diverticulectomy        Medication List       This list is accurate as of: 01/01/15 11:59 PM.  Always use your most recent med list.               acetaminophen 325 MG  tablet  Commonly known as:  TYLENOL  Take 2 tablets (650 mg total) by mouth every 6 (six) hours as needed for mild pain (or Fever >/= 101).     ALPRAZolam 0.25 MG tablet  Commonly known as:  XANAX  Take 1 tablet (0.25 mg total) by mouth at bedtime.     amLODipine-atorvastatin 5-20 MG tablet  Commonly known as:  CADUET  TAKE 1 TABLET DAILY     aspirin 81 MG EC tablet  Take 1 tablet (81 mg total) by mouth daily.     BENEFIBER Powd  Take 1 tablespoon by mouth in water or juice once daily     clopidogrel 75 MG tablet  Commonly known as:  PLAVIX  Take 1 tablet (75 mg total) by mouth daily.     escitalopram 10 MG tablet  Commonly known as:  LEXAPRO  Take 1 tablet daily by mouth for anxiety.     feeding supplement (ENSURE ENLIVE) Liqd  Take 237 mLs by mouth 2 (two) times daily between meals.     finasteride 5 MG tablet  Commonly known as:  PROSCAR  Take 5 mg by mouth daily.     furosemide 20 MG tablet  Commonly known as:  LASIX  Take 1 tablet (20 mg total) by mouth daily.     GARLIC PO  Take 1 tablet  by mouth daily.     LACTOBACILLUS PO  Take 1 tablet by mouth daily.     loperamide 2 MG tablet  Commonly known as:  IMODIUM A-D  Take 1 tablet (2 mg total) by mouth as needed for diarrhea or loose stools.     metroNIDAZOLE 500 MG tablet  Commonly known as:  FLAGYL  Take 1 tablet (500 mg total) by mouth every 8 (eight) hours. For 11days     omeprazole 10 MG capsule  Commonly known as:  PRILOSEC  Take 1 capsule by mouth daily.     potassium chloride SA 20 MEQ tablet  Commonly known as:  K-DUR,KLOR-CON  Take 20 mEq by mouth daily.     PRESERVISION/LUTEIN PO  Take 1 capsule by mouth 2 (two) times daily.     tamsulosin 0.4 MG Caps capsule  Commonly known as:  FLOMAX  Take 1 capsule by mouth daily.     VITAMIN B12 PO  Take 1 tablet by mouth daily.     Vitamin D 2000 UNITS tablet  Take 2,000 Units by mouth daily.        No orders of the defined types were  placed in this encounter.    There is no immunization history for the selected administration types on file for this patient.  Social History  Substance Use Topics  . Smoking status: Former Smoker    Quit date: 05/07/1961  . Smokeless tobacco: Never Used  . Alcohol Use: No    Family history is + CA   Review of Systems  DATA OBTAINED: from patient, nurse GENERAL:  no fevers, fatigue, appetite changes SKIN: No itching, rash or wounds EYES: No eye pain, redness, discharge EARS: No earache, tinnitus, change in hearing NOSE: No congestion, drainage or bleeding  MOUTH/THROAT: No mouth or tooth pain, No sore throat RESPIRATORY: No cough, wheezing, SOB CARDIAC: No chest pain, palpitations, lower extremity edema  GI: No abdominal pain, No N/V/D or constipation, No heartburn or reflux  GU: No dysuria, frequency or urgency, or incontinence  MUSCULOSKELETAL: No unrelieved bone/joint pain NEUROLOGIC: No headache, dizziness or focal weakness PSYCHIATRIC: No c/o anxiety or sadness   Filed Vitals:   01/01/15 1227  BP: 138/64  Pulse: 67  Temp: 97.2 F (36.2 C)  Resp: 16    SpO2 Readings from Last 1 Encounters:  12/28/14 99%        Physical Exam  GENERAL APPEARANCE: Alert, min conversant,  No acute distress.  SKIN: No diaphoresis rash HEAD: Normocephalic, atraumatic  EYES: Conjunctiva/lids clear. Pupils round, reactive. EOMs intact.  EARS: External exam WNL, canals clear. Hearing grossly normal.  NOSE: No deformity or discharge.  MOUTH/THROAT: Lips w/o lesions  RESPIRATORY: Breathing is even, unlabored. Lung sounds are clear   CARDIOVASCULAR: Heart RRR no murmurs, rubs or gallops. No peripheral edema.   GASTROINTESTINAL: Abdomen is soft, non-tender, not distended w/ normal bowel sounds. GENITOURINARY: Bladder non tender, not distended  MUSCULOSKELETAL: No abnormal joints or musculature NEUROLOGIC:  Cranial nerves 2-12 grossly intact. Moves all extremities  PSYCHIATRIC:  Mood and affect appropriate to situation, no behavioral issues  Patient Active Problem List   Diagnosis Date Noted  . C. difficile colitis 01/01/2015  . Chronic diastolic CHF (congestive heart failure) (HCC) 01/01/2015  . Dementia without behavioral disturbance 01/01/2015  . Anxiety 01/01/2015  . FTT (failure to thrive) in adult 01/01/2015  . Altered mental status   . Protein-calorie malnutrition (HCC)   . Pressure ulcer 12/26/2014  . UTI (lower  urinary tract infection) 12/25/2014  . Dehydration 12/25/2014  . Acute cystitis without hematuria 12/25/2014  . Systolic and diastolic CHF, acute on chronic (HCC) 11/18/2014  . Depression 11/18/2014  . Anemia of chronic disease 11/18/2014  . BPH (benign prostatic hyperplasia) 11/18/2014  . Dyslipidemia 11/18/2014  . Acute respiratory failure with hypoxia (HCC) 11/18/2014  . A-fib (HCC) 05/08/2011  . CEREBROVASCULAR DISEASE 12/24/2008  . ESOPHAGEAL STRICTURE 12/24/2008  . Essential hypertension 09/29/2008  . GERD 09/29/2008    CBC    Component Value Date/Time   WBC 5.7 12/28/2014 0448   RBC 2.97* 12/28/2014 0448   HGB 9.2* 12/28/2014 0448   HCT 29.1* 12/28/2014 0448   PLT 226 12/28/2014 0448   MCV 98.0 12/28/2014 0448   LYMPHSABS 1.1 12/25/2014 1224   MONOABS 0.5 12/25/2014 1224   EOSABS 0.2 12/25/2014 1224   BASOSABS 0.0 12/25/2014 1224    CMP     Component Value Date/Time   NA 144 12/28/2014 0448   K 3.6 12/28/2014 0448   CL 105 12/28/2014 0448   CO2 29 12/28/2014 0448   GLUCOSE 104* 12/28/2014 0448   BUN 24* 12/28/2014 0448   CREATININE 0.99 12/28/2014 0448   CREATININE 0.99 11/09/2014 1216   CALCIUM 8.6* 12/28/2014 0448   PROT 7.0 12/25/2014 1224   ALBUMIN 3.3* 12/25/2014 1224   AST 14* 12/25/2014 1224   ALT 11* 12/25/2014 1224   ALKPHOS 133* 12/25/2014 1224   BILITOT 1.1 12/25/2014 1224   GFRNONAA >60 12/28/2014 0448   GFRNONAA 67 07/30/2014 1405   GFRAA >60 12/28/2014 0448   GFRAA 77 07/30/2014 1405     Lab Results  Component Value Date   HGBA1C 5.9* 03/13/2013     Dg Chest 2 View  12/25/2014  CLINICAL DATA:  Increased confusion.  Cough.  Wheezing. EXAM: CHEST - 2 VIEW COMPARISON:  Two-view chest x-ray 11/18/2014 FINDINGS: Heart is enlarged. Atherosclerotic calcifications are present at the the aortic arch. There is continued increase in a diffuse interstitial pattern likely represents edema, worse on the right. Lingular airspace disease is now present. Exaggerated thoracic kyphosis and remote fractures of the mid thoracic spine are stable. IMPRESSION: 1. Progressive interstitial and airspace disease involving the lingula there is further collapse since the prior study. 2. Cardiomegaly and mild edema, suggesting congestive heart failure. Electronically Signed   By: Marin Roberts M.D.   On: 12/25/2014 15:53    Not all labs, radiology exams or other studies done during hospitalization come through on my EPIC note; however they are reviewed by me.    Assessment and Plan  UTI (lower urinary tract infection) Treated with IV ceftriaxone x 4 days,  -urine culture were polymicrobial, due to concurrent Cdiff, I stopped antibiotics after today's dose  C. difficile colitis -mild without fevers, leukocytosis and first episode -has baseline of chronic diarrhea -Started on PO Flagyl and diarrhea improving SNF - continue flagyl  for 12 more days   Chronic diastolic CHF (congestive heart failure) (HCC) SNF - reported euvolemic; cont lasix 20 mg  Dementia without behavioral disturbance SNF - reported stable ;on no meds  CEREBROVASCULAR DISEASE SNF -reported stable; cont ASA as prophylaxis and plavix  Anxiety SNF - chronic and stable - cont lexapro and xanax  FTT (failure to thrive) in adult SNF - due to advanced age, UTI, C. Difficile -pT OT eval-completed, SNF recommended for rehab -family in agreement  -DNR   Time spent > 45 min;> 50% of time with patient was spent  reviewing  records, labs, tests and studies, counseling and developing plan of care  Margit Hanks, MD

## 2015-01-01 NOTE — Assessment & Plan Note (Signed)
Treated with IV ceftriaxone x 4 days,  -urine culture were polymicrobial, due to concurrent Cdiff, I stopped antibiotics after today's dose

## 2015-01-11 ENCOUNTER — Non-Acute Institutional Stay (SKILLED_NURSING_FACILITY): Payer: Medicare Other | Admitting: Internal Medicine

## 2015-01-11 ENCOUNTER — Encounter: Payer: Self-pay | Admitting: Internal Medicine

## 2015-01-11 DIAGNOSIS — F0390 Unspecified dementia without behavioral disturbance: Secondary | ICD-10-CM

## 2015-01-11 DIAGNOSIS — I5032 Chronic diastolic (congestive) heart failure: Secondary | ICD-10-CM

## 2015-01-11 DIAGNOSIS — A047 Enterocolitis due to Clostridium difficile: Secondary | ICD-10-CM

## 2015-01-11 DIAGNOSIS — I679 Cerebrovascular disease, unspecified: Secondary | ICD-10-CM | POA: Diagnosis not present

## 2015-01-11 DIAGNOSIS — R627 Adult failure to thrive: Secondary | ICD-10-CM | POA: Diagnosis not present

## 2015-01-11 DIAGNOSIS — F419 Anxiety disorder, unspecified: Secondary | ICD-10-CM

## 2015-01-11 DIAGNOSIS — A0472 Enterocolitis due to Clostridium difficile, not specified as recurrent: Secondary | ICD-10-CM

## 2015-01-11 DIAGNOSIS — I482 Chronic atrial fibrillation, unspecified: Secondary | ICD-10-CM

## 2015-01-11 NOTE — Progress Notes (Signed)
MRN: [id number] Name: Nathan Mills  Sex: male Age: 81 y.o. DOB: [birth year]  [facility]: [name] Facility/Room:105 Level Of Care: SNF Provider: Merrilee Seashore D Emergency Contacts: Extended Emergency Contact Information Primary Emergency Contact: [name] Address: [address] [place], [place] Home Phone: [phone] Relation: Spouse Secondary Emergency Contact: [name],[name] Address: [address]          [place], [place] [address] [place] Phone: [phone] Relation: Daughter  Code Status:   Allergies: Review of patient's allergies indicates no known allergies.  Chief Complaint  Patient presents with  . Discharge Note    HPI: Patient is 81 y.o. male  with COPD, CHF, Stroke, dementia who was admitted to [facility] from 12/13-16 for confusion and diarrhea where he was dx with and treated for C difficile and hyponatremia. Pt is admitted to SNF for generalized weakness for OT/PT and is now ready to be d/c to home.  Past Medical History  Diagnosis Date  . COPD (chronic obstructive pulmonary disease) (HCC)   . A-fib (HCC)   . CHF (congestive heart failure) (HCC)   . MI (mitral incompetence)   . UTI (lower urinary tract infection)   . Hypertension   . Stroke (HCC)   . Dementia   . Zenker's (hypopharyngeal) diverticulum   . Kidney stones     Past Surgical History  Procedure Laterality Date  . Laparoscopic cholecystectomy    . Implantation of a medtronic implantation loop recorder  03/01/2006    Dr. Ladona Ridgel  . Esophagogastroduodenoscopy  01/24/2009    Esophageal Stricture--Multiple EGD's  . Kidney stone surgery    . Zenker's diverticulectomy        Medication List       This list is accurate as of: 01/11/15  2:07 PM.  Always use your most recent med list.               acetaminophen 325 MG tablet  Commonly known as:  TYLENOL  Take 2 tablets (650 mg total) by mouth every 6 (six) hours as  needed for mild pain (or Fever >/= 101).     ALPRAZolam 0.25 MG tablet  Commonly known as:  XANAX  Take 1 tablet (0.25 mg total) by mouth at bedtime.     amLODipine-atorvastatin 5-20 MG tablet  Commonly known as:  CADUET  TAKE 1 TABLET DAILY     aspirin 81 MG EC tablet  Take 1 tablet (81 mg total) by mouth daily.     BENEFIBER Powd  Take 1 tablespoon by mouth in water or juice once daily     clopidogrel 75 MG tablet  Commonly known as:  PLAVIX  Take 1 tablet (75 mg total) by mouth daily.     escitalopram 10 MG tablet  Commonly known as:  LEXAPRO  Take 1 tablet daily by mouth for anxiety.     feeding supplement (ENSURE ENLIVE) Liqd  Take 237 mLs by mouth 2 (two) times daily between meals.     finasteride 5 MG tablet  Commonly known as:  PROSCAR  Take 5 mg by mouth daily.     furosemide 20 MG tablet  Commonly known as:  LASIX  Take 1 tablet (20 mg total) by mouth daily.     GARLIC PO  Take 1 tablet by mouth daily.     LACTOBACILLUS PO  Take 1 tablet by mouth daily.  loperamide 2 MG tablet  Commonly known as:  IMODIUM A-D  Take 1 tablet (2 mg total) by mouth as needed for diarrhea or loose stools.     metroNIDAZOLE 500 MG tablet  Commonly known as:  FLAGYL  Take 1 tablet (500 mg total) by mouth every 8 (eight) hours. For 11days     omeprazole 10 MG capsule  Commonly known as:  PRILOSEC  Take 1 capsule by mouth daily.     potassium chloride SA 20 MEQ tablet  Commonly known as:  K-DUR,KLOR-CON  Take 20 mEq by mouth daily.     PRESERVISION/LUTEIN PO  Take 1 capsule by mouth 2 (two) times daily.     tamsulosin 0.4 MG Caps capsule  Commonly known as:  FLOMAX  Take 1 capsule by mouth daily.     VITAMIN B12 PO  Take 1 tablet by mouth daily.     Vitamin D 2000 units tablet  Take 2,000 Units by mouth daily.        No orders of the defined types were placed in this encounter.    There is no immunization history for the selected administration types on  file for this patient.  Social History  Substance Use Topics  . Smoking status: Former Smoker    Quit date: 05/07/1961  . Smokeless tobacco: Never Used  . Alcohol Use: No    Filed Vitals:   01/11/15 1403  BP: 135/66  Pulse: 52  Temp: 98 F (36.7 C)  Resp: 18    Physical Exam  GENERAL APPEARANCE: Alert, conversant. No acute distress.  HEENT: Unremarkable. RESPIRATORY: Breathing is even, unlabored. Lung sounds are clear   CARDIOVASCULAR: Heart RRR no murmurs, rubs or gallops. No peripheral edema.  GASTROINTESTINAL: Abdomen is soft, non-tender, not distended w/ normal bowel sounds.  NEUROLOGIC: Cranial nerves 2-12 grossly intact. Moves all extremities  Patient Active Problem List   Diagnosis Date Noted  . C. difficile colitis 01/01/2015  . Chronic diastolic CHF (congestive heart failure) (HCC) 01/01/2015  . Dementia without behavioral disturbance 01/01/2015  . Anxiety 01/01/2015  . FTT (failure to thrive) in adult 01/01/2015  . Altered mental status   . Protein-calorie malnutrition (HCC)   . Pressure ulcer 12/26/2014  . UTI (lower urinary tract infection) 12/25/2014  . Dehydration 12/25/2014  . Acute cystitis without hematuria 12/25/2014  . Systolic and diastolic CHF, acute on chronic (HCC) 11/18/2014  . Depression 11/18/2014  . Anemia of chronic disease 11/18/2014  . BPH (benign prostatic hyperplasia) 11/18/2014  . Dyslipidemia 11/18/2014  . Acute respiratory failure with hypoxia (HCC) 11/18/2014  . A-fib (HCC) 05/08/2011  . CEREBROVASCULAR DISEASE 12/24/2008  . ESOPHAGEAL STRICTURE 12/24/2008  . Essential hypertension 09/29/2008  . GERD 09/29/2008    CBC    Component Value Date/Time   WBC 5.7 12/28/2014 0448   RBC 2.97* 12/28/2014 0448   HGB 9.2* 12/28/2014 0448   HCT 29.1* 12/28/2014 0448   PLT 226 12/28/2014 0448   MCV 98.0 12/28/2014 0448   LYMPHSABS 1.1 12/25/2014 1224   MONOABS 0.5 12/25/2014 1224   EOSABS 0.2 12/25/2014 1224   BASOSABS 0.0  12/25/2014 1224    CMP     Component Value Date/Time   NA 144 12/28/2014 0448   K 3.6 12/28/2014 0448   CL 105 12/28/2014 0448   CO2 29 12/28/2014 0448   GLUCOSE 104* 12/28/2014 0448   BUN 24* 12/28/2014 0448   CREATININE 0.99 12/28/2014 0448   CREATININE 0.99 11/09/2014 1216   CALCIUM  8.6* 12/28/2014 0448   PROT 7.0 12/25/2014 1224   ALBUMIN 3.3* 12/25/2014 1224   AST 14* 12/25/2014 1224   ALT 11* 12/25/2014 1224   ALKPHOS 133* 12/25/2014 1224   BILITOT 1.1 12/25/2014 1224   GFRNONAA >60 12/28/2014 0448   GFRNONAA 67 07/30/2014 1405   GFRAA >60 12/28/2014 0448   GFRAA 77 07/30/2014 1405    Assessment and Plan  Pt is ready to be d/c to home with HH/OT/PT/nursing. Rx's have been written.  Margit Hanks, MD

## 2015-02-08 ENCOUNTER — Ambulatory Visit: Payer: Medicare Other | Admitting: Cardiology

## 2015-02-11 ENCOUNTER — Ambulatory Visit: Payer: Medicare Other | Admitting: Cardiology

## 2015-07-13 DEATH — deceased

## 2016-11-13 IMAGING — CT CT HEAD W/O CM
2 series · 16 of 30 positions shown, 18 images · non-contrast
Comparison: 03/12/2013

CLINICAL DATA: Intermittent headache following a fall 2 nights ago
due to syncope. He hit his head when he fell.

EXAM:
CT HEAD WITHOUT CONTRAST
TECHNIQUE: Contiguous axial images were obtained from the base of the skull
through the vertex without intravenous contrast.

[Series 2: head 4.8 h37s · axial · 0.44mm/px · z∈[-151,-15]mm · 8 of 36 slices shown, 10 images]
[im 4/36  brain]
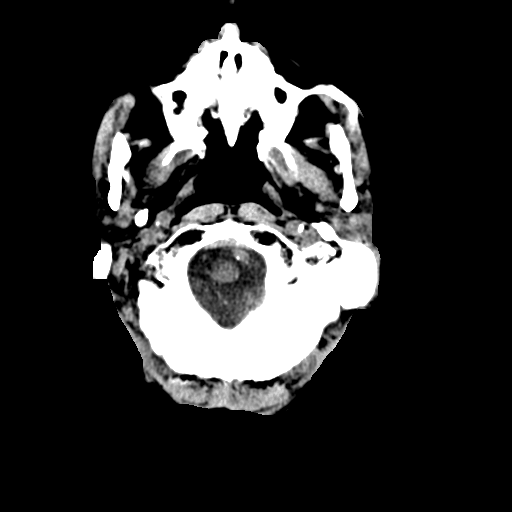
[im 4/36  bone]
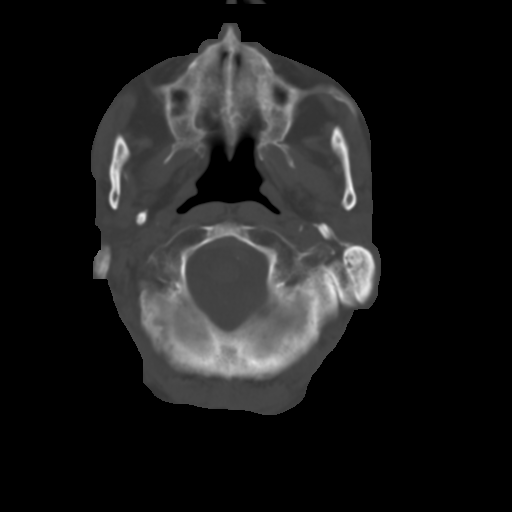
[im 8/36  brain]
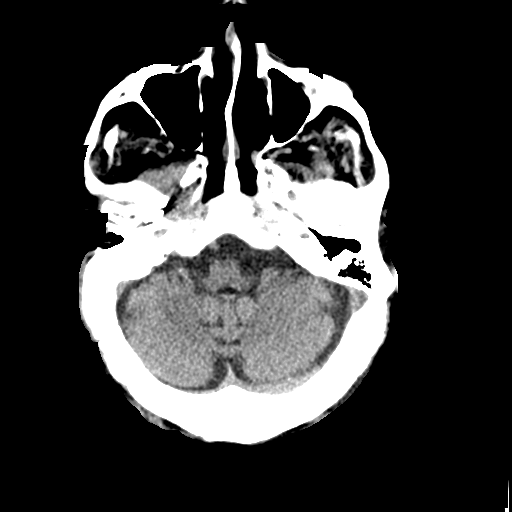
[im 12/36  brain]
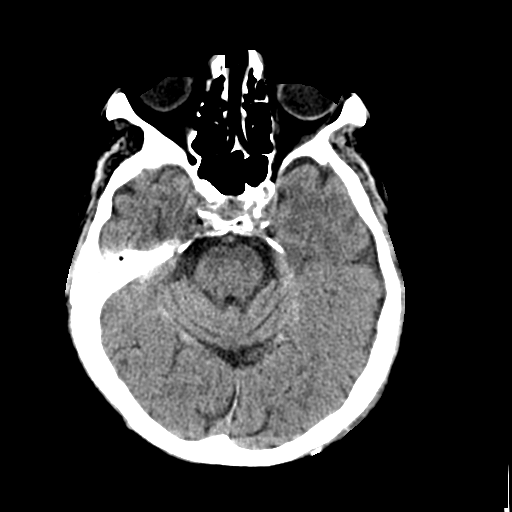
[im 16/36  brain]
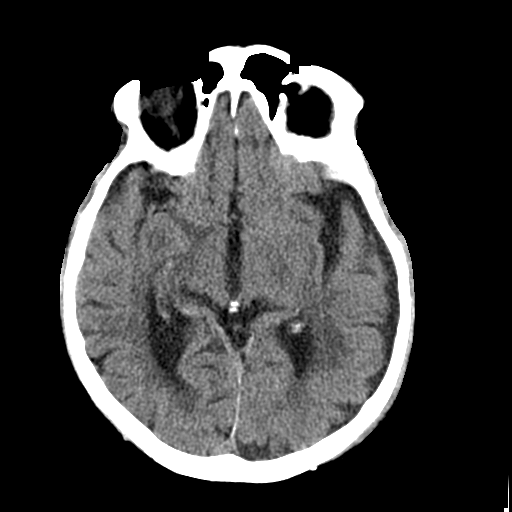
[im 20/36  brain]
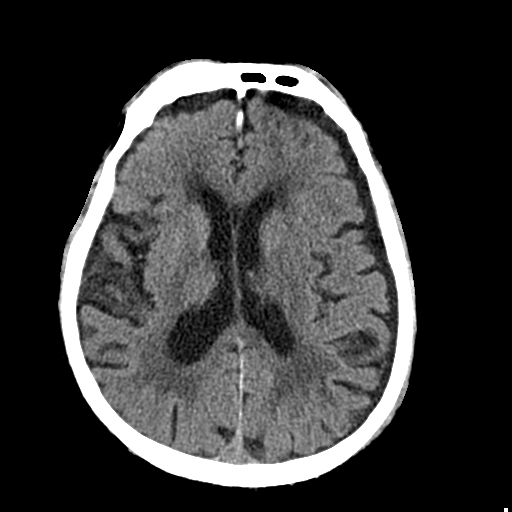
[im 20/36  bone]
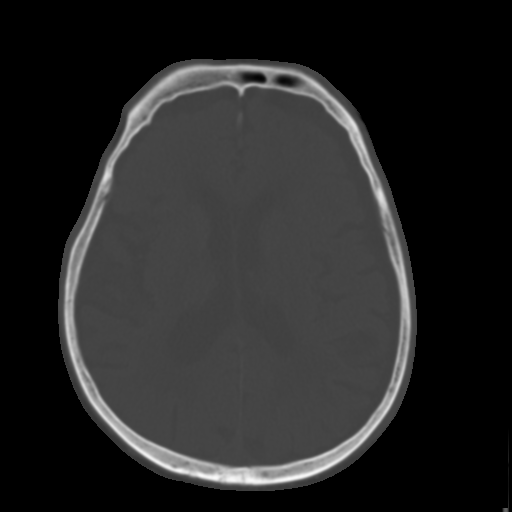
[im 24/36  brain]
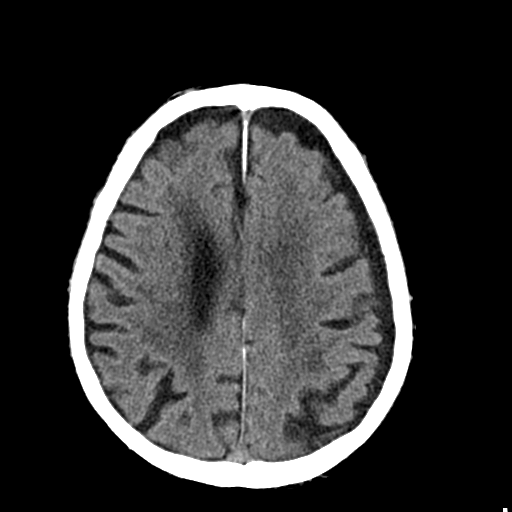
[im 28/36  brain]
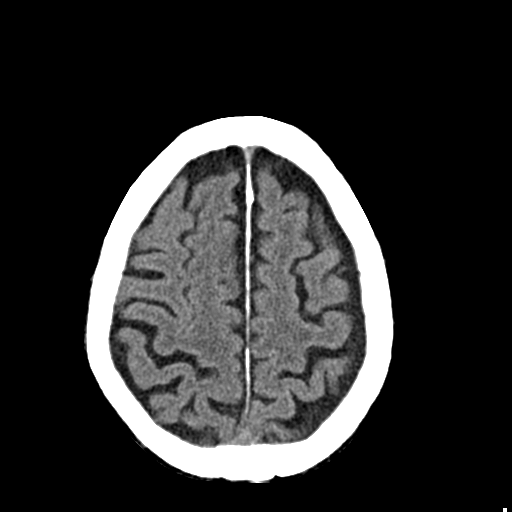
[im 32/36  brain]
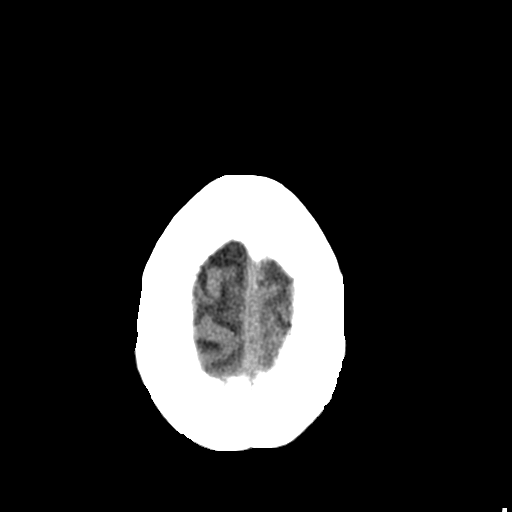

[Series 3: head 2.4 h60s bone · axial · 0.44mm/px · z∈[-150,-14]mm · 8 of 72 slices shown]
[im 8/72  bone]
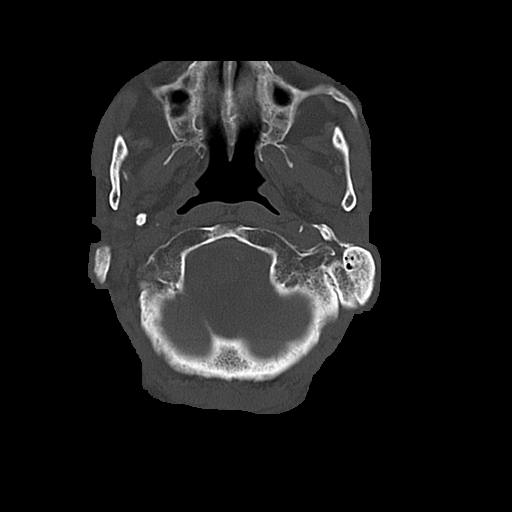
[im 15/72  bone]
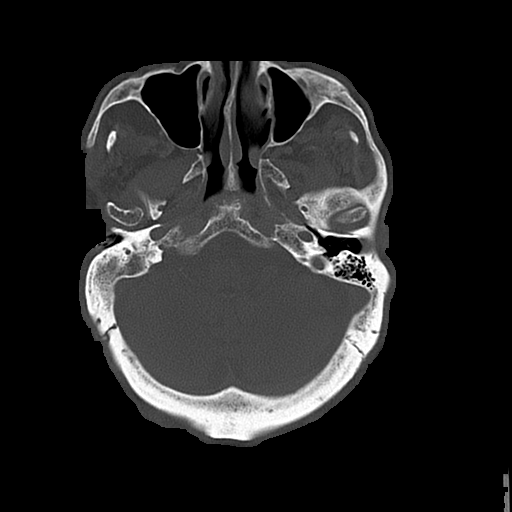
[im 23/72  bone]
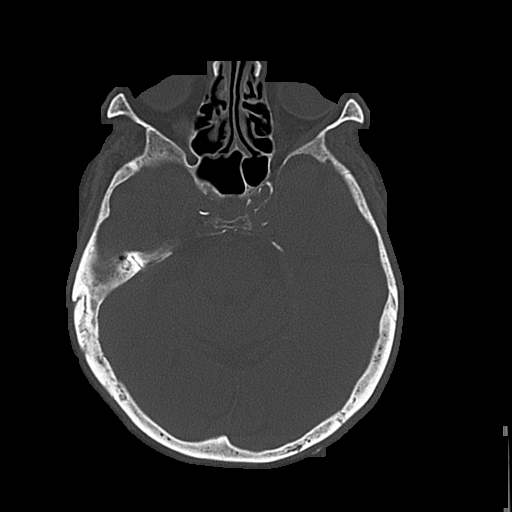
[im 30/72  bone]
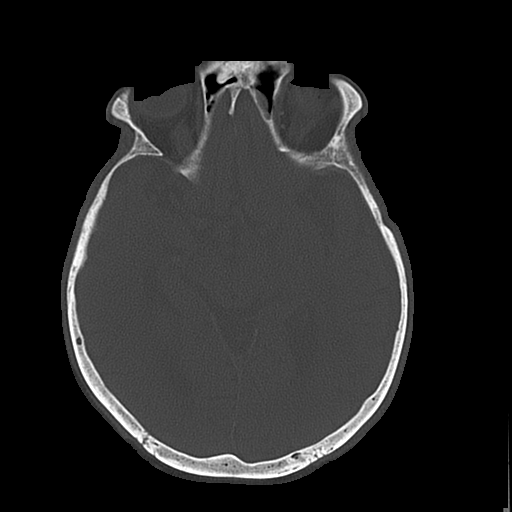
[im 42/72  bone]
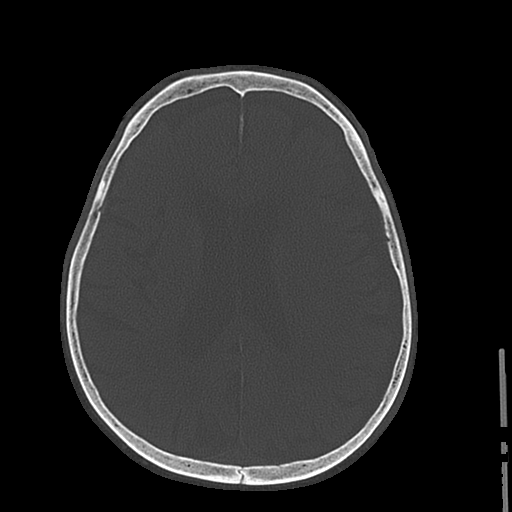
[im 49/72  bone]
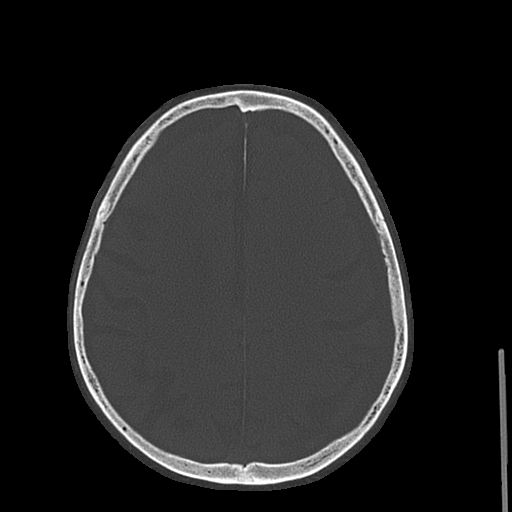
[im 57/72  bone]
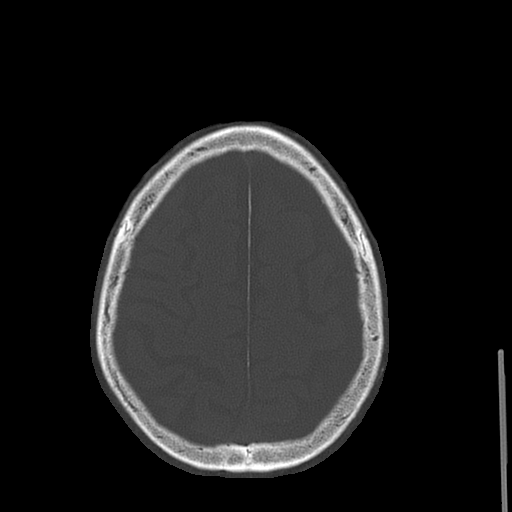
[im 64/72  bone]
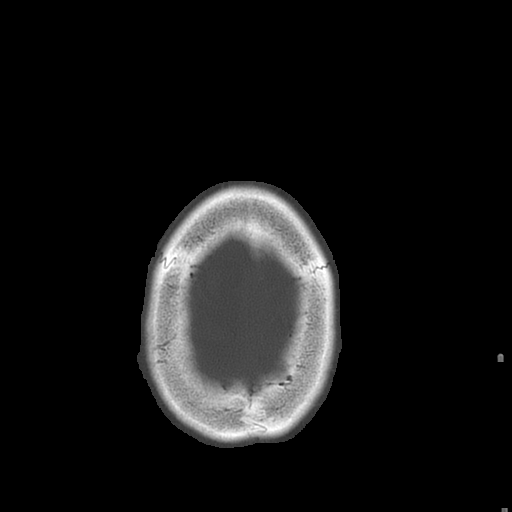

[16 of 30 positions shown; findings below may reference images not displayed]

FINDINGS: Diffusely enlarged ventricles and subarachnoid spaces. Patchy white
matter low density in both cerebral hemispheres. Interval left
lateral subdural hygroma measuring 6 mm in maximum thickness. No
acute intracranial hemorrhage, mass lesion, skull fracture, evidence
of acute infarction or paranasal sinus air-fluid levels. Left
maxillary sinus retention cyst and minimal right sphenoid sinus
mucosal thickening or retention cyst.
IMPRESSION: 1. No skull fracture or intracranial hemorrhage.
2. Interval 6 mm thick left lateral subdural hygroma.
3. Stable atrophy and chronic small vessel white matter ischemic
changes.

## 2016-12-13 IMAGING — DX DG CHEST 1V PORT
1 series · 2 of 2 positions shown · non-contrast
Comparison: 06/17/2014.

CLINICAL DATA: Congestive heart failure.

EXAM:
PORTABLE CHEST - 1 VIEW

[Series 1: chest ap · 0.14mm/px · 2 of 2 slices shown]
[im 1/2]
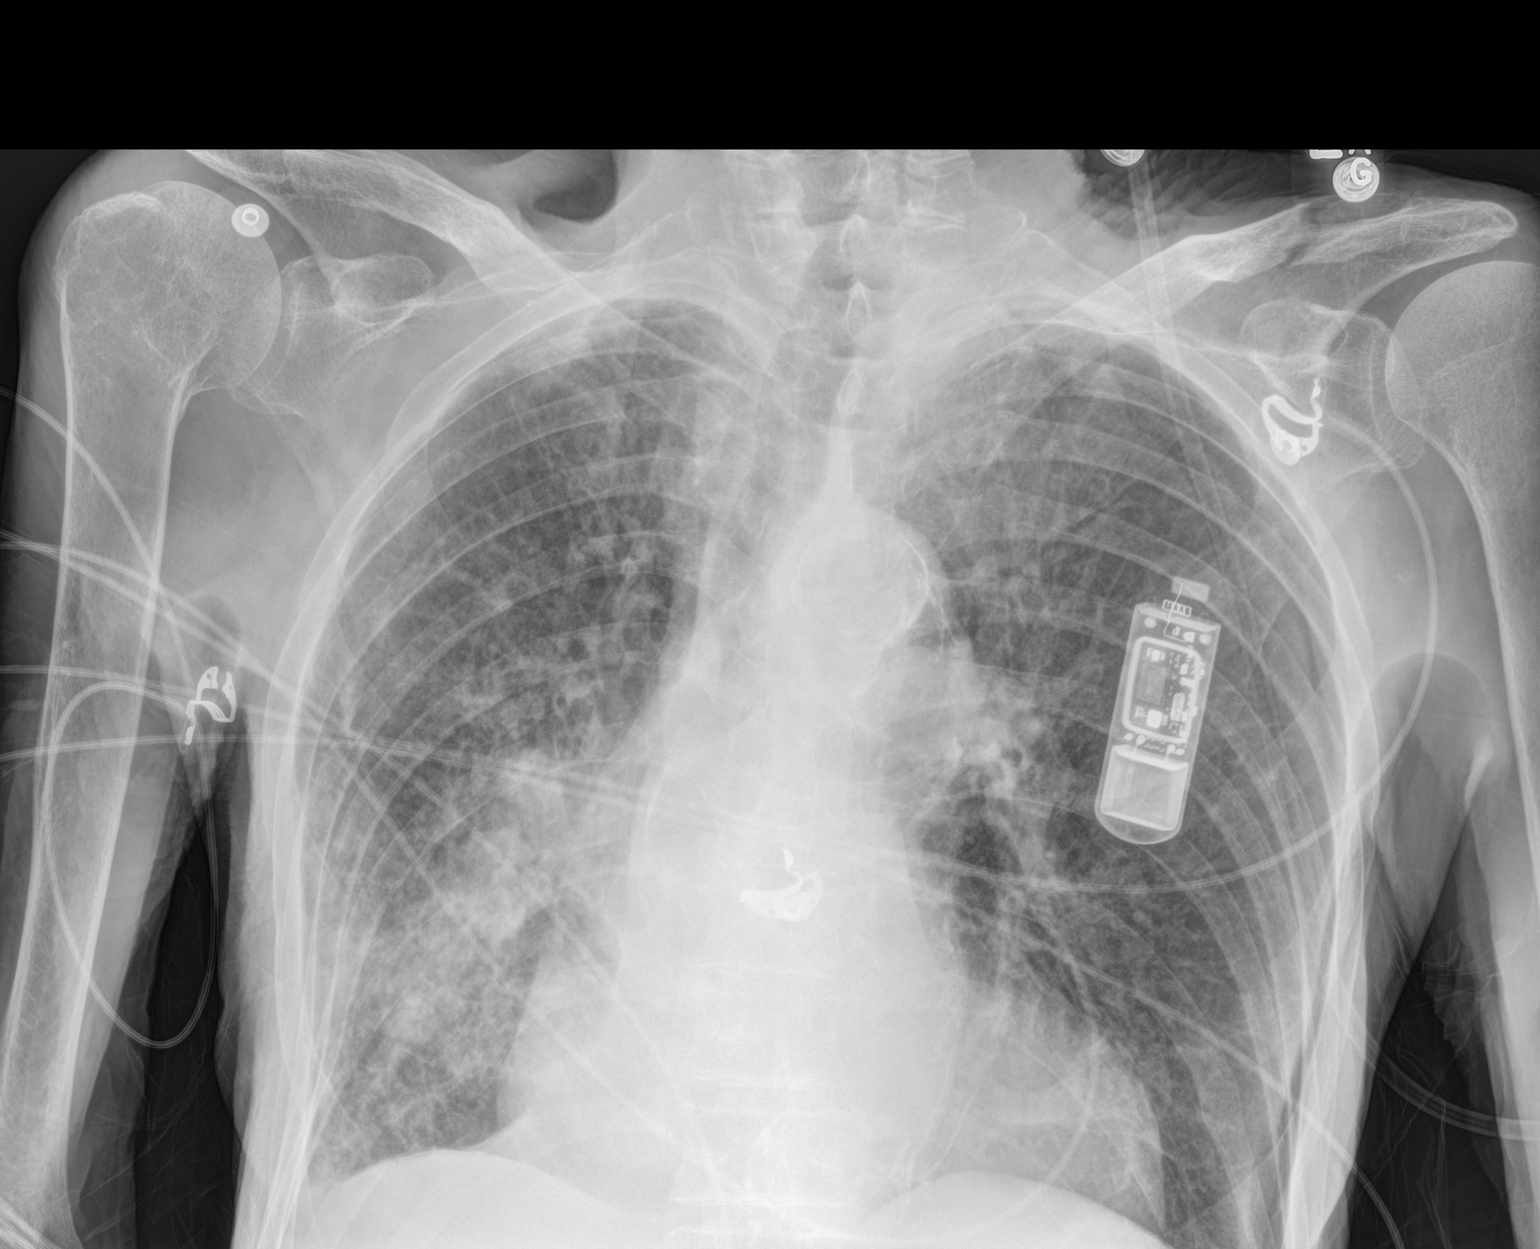
[im 2/2]
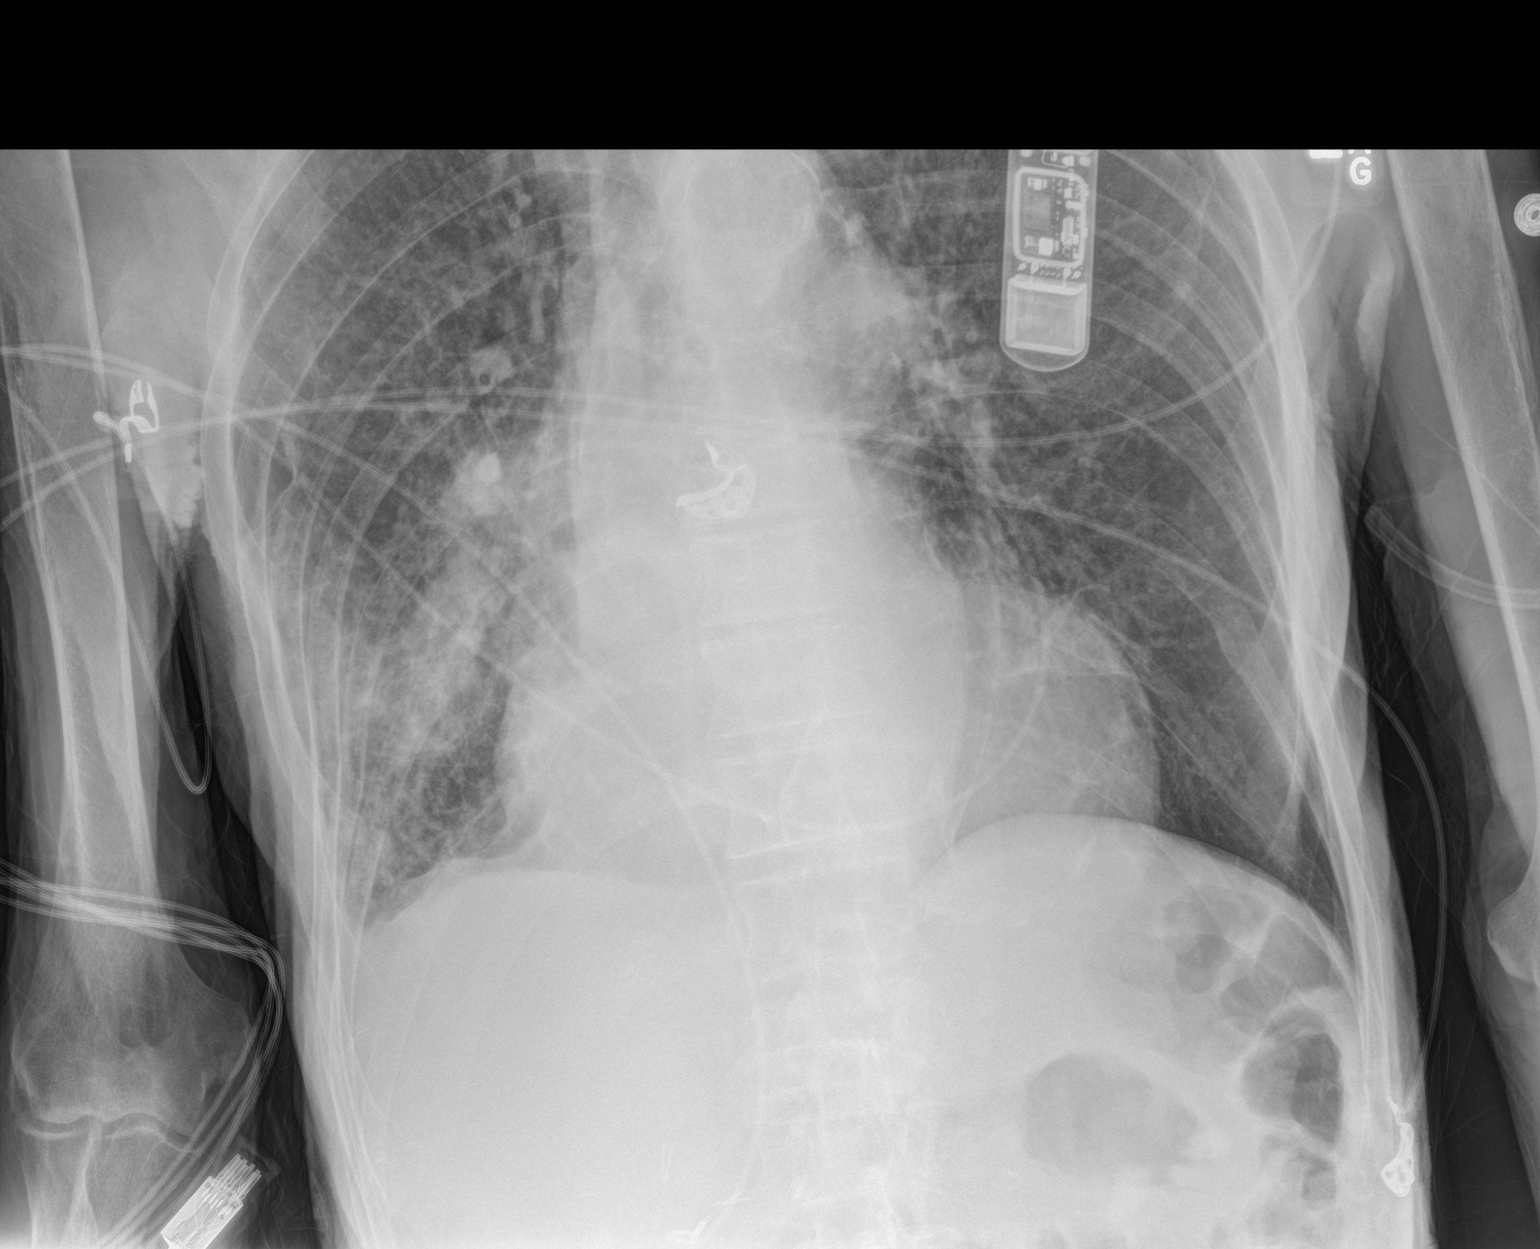

[2 of 2 positions shown; findings below may reference images not displayed]

FINDINGS: Mediastinum hilar structures are normal. Cardiac monitor noted.
Cardiomegaly. Diffuse bilateral pulmonary interstitial prominence
with alveolar infiltrate right mid lung. Findings suggest congestive
heart failure with possible right mid lung pneumonia . Small right
pleural effusion. No pneumothorax.
IMPRESSION: 1. Cardiomegaly diffuse pulmonary interstitial prominence consistent
with congestive heart failure. Small right pleural effusion.
2. Alveolar infiltrate right mid lung. This could represent focal
alveolar edema and/or pneumonia .

## 2017-04-24 IMAGING — CR DG CHEST 2V
2 series · 2 of 2 positions shown · non-contrast
Comparison: 06/19/2014

CLINICAL DATA: Two day history of cough

EXAM:
CHEST  2 VIEW

[w chest pa]
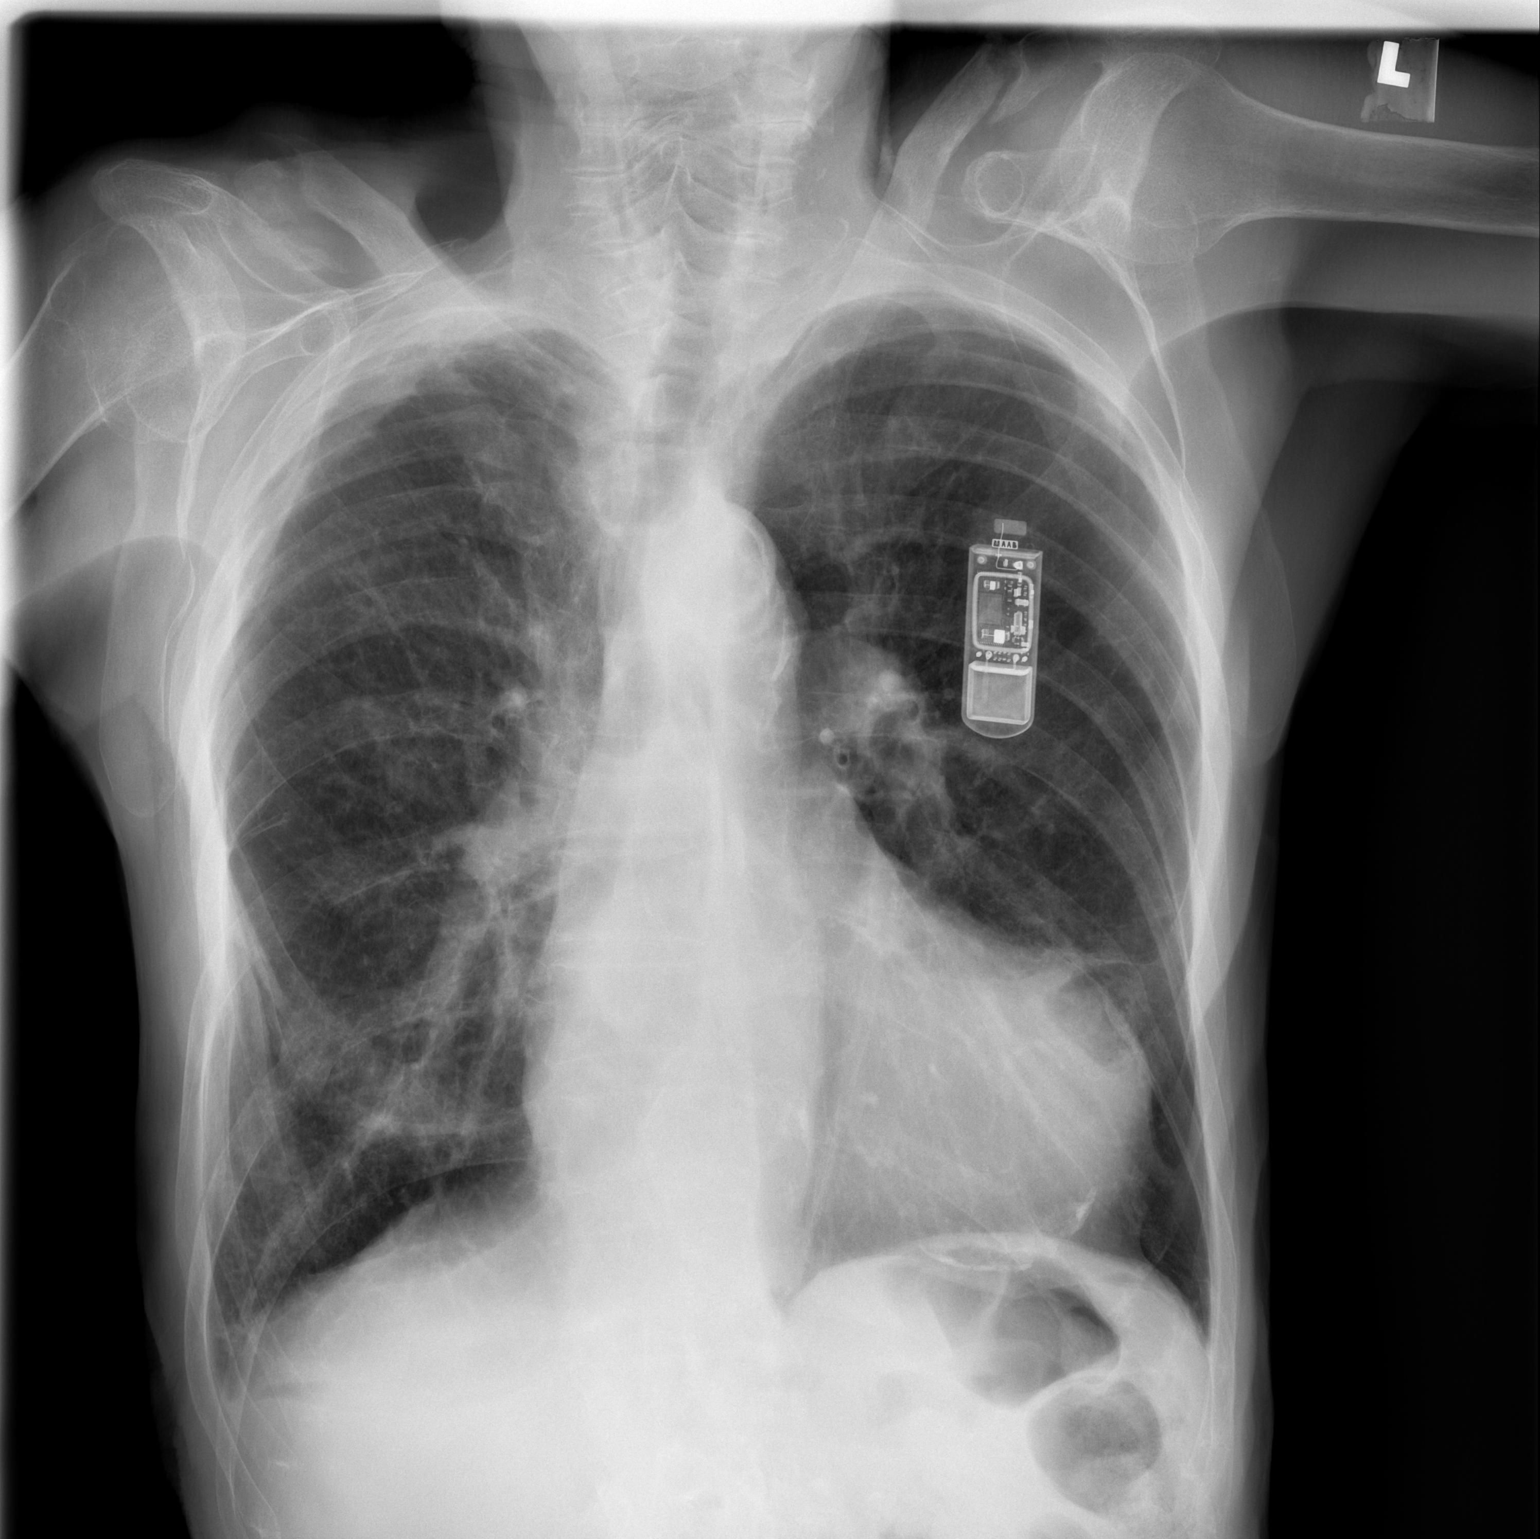

[w chest lat]
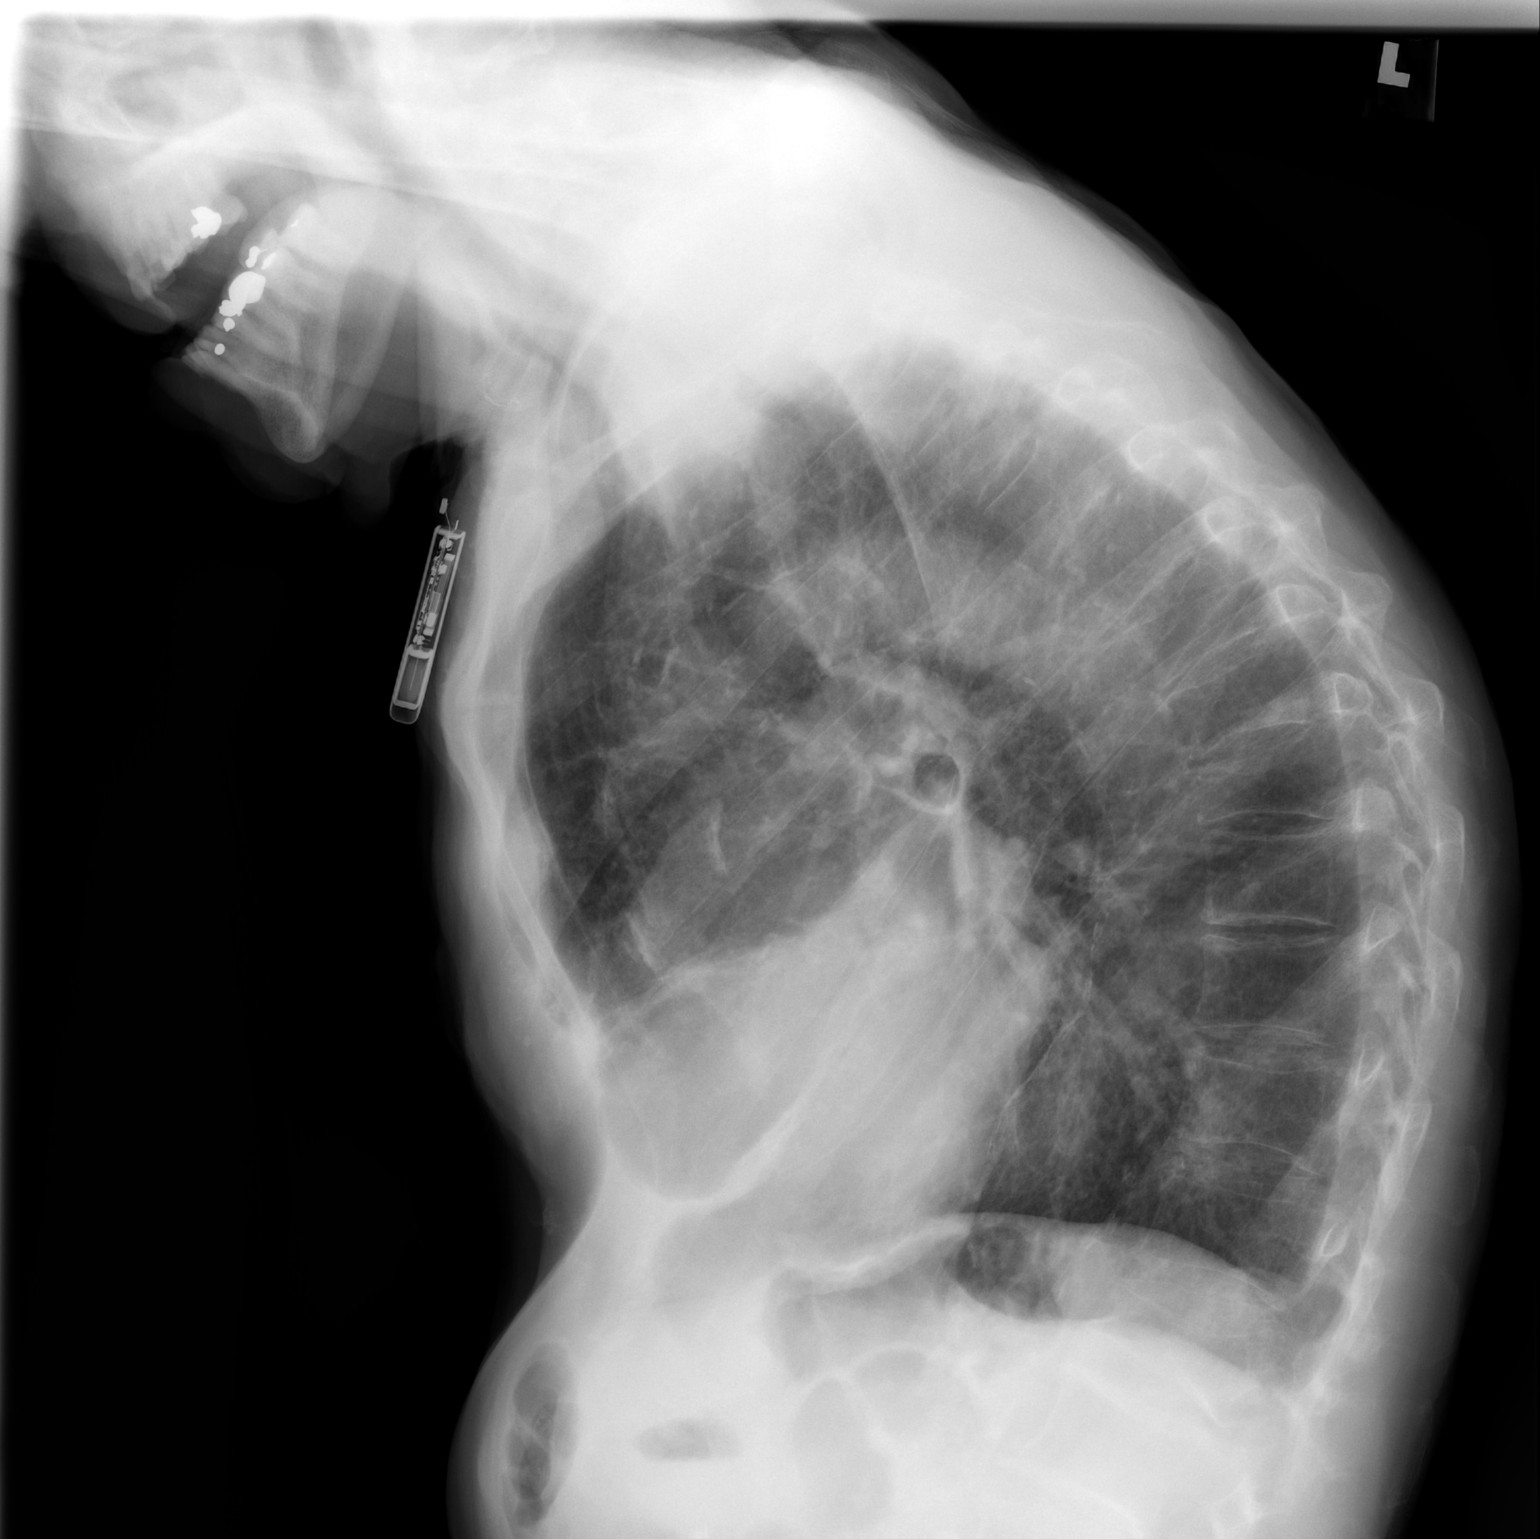

[2 of 2 positions shown; findings below may reference images not displayed]

FINDINGS: The heart is borderline enlarged but stable. Mild tortuosity and
calcification of the thoracic aorta. The pulmonary hila appear
normal and stable. Significant chronic underlying lung disease with
areas of pulmonary scarring. No focal airspace consolidation or
pleural effusion. Suspect emphysematous changes. A loop recorder is
noted on the left side. Stable exaggerated thoracic kyphosis and
thoracic compression deformities.
IMPRESSION: Chronic lung disease without acute overlying pulmonary process.
